# Patient Record
Sex: Female | Born: 1965 | Race: Black or African American | Hispanic: No | Smoking: Never smoker
Health system: Southern US, Community
[De-identification: ages and names within clinical notes are randomized; demographics above are authoritative.]

## PROBLEM LIST (undated history)

## (undated) DIAGNOSIS — I10 Essential (primary) hypertension: Secondary | ICD-10-CM

## (undated) DIAGNOSIS — K5900 Constipation, unspecified: Secondary | ICD-10-CM

## (undated) DIAGNOSIS — J189 Pneumonia, unspecified organism: Secondary | ICD-10-CM

## (undated) DIAGNOSIS — R112 Nausea with vomiting, unspecified: Secondary | ICD-10-CM

## (undated) DIAGNOSIS — K219 Gastro-esophageal reflux disease without esophagitis: Secondary | ICD-10-CM

## (undated) DIAGNOSIS — Z9889 Other specified postprocedural states: Secondary | ICD-10-CM

## (undated) DIAGNOSIS — M1990 Unspecified osteoarthritis, unspecified site: Secondary | ICD-10-CM

## (undated) DIAGNOSIS — Z8489 Family history of other specified conditions: Secondary | ICD-10-CM

## (undated) DIAGNOSIS — B009 Herpesviral infection, unspecified: Secondary | ICD-10-CM

## (undated) HISTORY — PX: OTHER SURGICAL HISTORY: SHX169

## (undated) HISTORY — PX: TUBAL LIGATION: SHX77

## (undated) HISTORY — PX: KNEE ARTHROSCOPY: SUR90

## (undated) HISTORY — DX: Essential (primary) hypertension: I10

---

## 1998-05-15 ENCOUNTER — Emergency Department (HOSPITAL_COMMUNITY): Admission: EM | Admit: 1998-05-15 | Discharge: 1998-05-15 | Payer: Self-pay | Admitting: Emergency Medicine

## 1998-11-06 ENCOUNTER — Other Ambulatory Visit: Admission: RE | Admit: 1998-11-06 | Discharge: 1998-11-06 | Payer: Self-pay | Admitting: Gynecology

## 1999-11-17 ENCOUNTER — Ambulatory Visit (HOSPITAL_BASED_OUTPATIENT_CLINIC_OR_DEPARTMENT_OTHER): Admission: RE | Admit: 1999-11-17 | Discharge: 1999-11-17 | Payer: Self-pay | Admitting: General Surgery

## 2000-05-31 ENCOUNTER — Other Ambulatory Visit: Admission: RE | Admit: 2000-05-31 | Discharge: 2000-05-31 | Payer: Self-pay | Admitting: *Deleted

## 2000-08-26 ENCOUNTER — Observation Stay (HOSPITAL_COMMUNITY): Admission: AD | Admit: 2000-08-26 | Discharge: 2000-08-27 | Payer: Self-pay | Admitting: Gynecology

## 2000-08-29 ENCOUNTER — Ambulatory Visit (HOSPITAL_COMMUNITY): Admission: RE | Admit: 2000-08-29 | Discharge: 2000-08-29 | Payer: Self-pay | Admitting: Gynecology

## 2000-08-29 ENCOUNTER — Encounter: Payer: Self-pay | Admitting: Gynecology

## 2000-11-26 ENCOUNTER — Inpatient Hospital Stay (HOSPITAL_COMMUNITY): Admission: AD | Admit: 2000-11-26 | Discharge: 2000-11-26 | Payer: Self-pay | Admitting: *Deleted

## 2000-11-29 ENCOUNTER — Encounter (INDEPENDENT_AMBULATORY_CARE_PROVIDER_SITE_OTHER): Payer: Self-pay

## 2000-11-29 ENCOUNTER — Inpatient Hospital Stay (HOSPITAL_COMMUNITY): Admission: AD | Admit: 2000-11-29 | Discharge: 2000-12-01 | Payer: Self-pay | Admitting: *Deleted

## 2001-01-11 ENCOUNTER — Other Ambulatory Visit: Admission: RE | Admit: 2001-01-11 | Discharge: 2001-01-11 | Payer: Self-pay | Admitting: *Deleted

## 2002-02-14 ENCOUNTER — Other Ambulatory Visit: Admission: RE | Admit: 2002-02-14 | Discharge: 2002-02-14 | Payer: Self-pay | Admitting: *Deleted

## 2003-02-18 ENCOUNTER — Other Ambulatory Visit: Admission: RE | Admit: 2003-02-18 | Discharge: 2003-02-18 | Payer: Self-pay | Admitting: Obstetrics and Gynecology

## 2004-03-21 ENCOUNTER — Emergency Department (HOSPITAL_COMMUNITY): Admission: EM | Admit: 2004-03-21 | Discharge: 2004-03-21 | Payer: Self-pay | Admitting: Family Medicine

## 2004-11-29 ENCOUNTER — Emergency Department (HOSPITAL_COMMUNITY): Admission: EM | Admit: 2004-11-29 | Discharge: 2004-11-29 | Payer: Self-pay | Admitting: Family Medicine

## 2006-02-05 ENCOUNTER — Emergency Department (HOSPITAL_COMMUNITY): Admission: EM | Admit: 2006-02-05 | Discharge: 2006-02-05 | Payer: Self-pay | Admitting: Emergency Medicine

## 2006-03-18 ENCOUNTER — Emergency Department (HOSPITAL_COMMUNITY): Admission: EM | Admit: 2006-03-18 | Discharge: 2006-03-18 | Payer: Self-pay | Admitting: Family Medicine

## 2006-07-08 ENCOUNTER — Other Ambulatory Visit: Admission: RE | Admit: 2006-07-08 | Discharge: 2006-07-08 | Payer: Self-pay | Admitting: *Deleted

## 2007-09-29 ENCOUNTER — Other Ambulatory Visit: Admission: RE | Admit: 2007-09-29 | Discharge: 2007-09-29 | Payer: Self-pay | Admitting: Family Medicine

## 2011-01-22 NOTE — Discharge Summary (Signed)
[facility]  Patient:    Cheyenne Graham, Cheyenne Graham                        MRN: [id number] Adm. Date:  [id number] Disc. Date: [id number] Attending:  Merrily Pew Dictator:   Antony Contras, [facility]                           Discharge Summary  DISCHARGE DIAGNOSES:          1. Intrauterine pregnancy at term.                               2. History of preterm labor.                               3. History of marginal placenta previa, which                                  resolved.                               4. Desires tubal sterilization.  PROCEDURES:                   Normal spontaneous vaginal delivery, viable ______ perineum.  Also, postpartum tubal ligation.  HISTORY OF PRESENT ILLNESS:   The patient is a 45 year old gravida 6, para 3, 0, 2, 3, with LMP February 28, 2000, [facility] December 04, 2000.  Prenatal risk factors include a history of ______ for which patient was treated with Procardia, which was discontinued at 36 weeks, also marginal placenta previa which resolved over sequential ultrasound measurements.  LABORATORY DATA:              Blood type B positive, antibody screen negative, sickle cell negative, RPR ______ rubella immune, MSAFP normal.  Amniocentesis is normal.  GBS negative.  HOSPITAL COURSE AND TREATMENT:    The patient was admitted on November 29, 2000, with regular uterine contractions.  Cervix was 4 cm dilated.  She did progress to complete dilatation, was delivered of an Apgar 9/9, female infant weighing 8 pounds 1 ounce over intact perineum.  She did wish tubal sterilization and this was performed on November 30, 2000.  Delivery and sterilization performed by Dr. ______ .  Findings involved postpartum uterus with normal tubes and ovaries.  POSTPARTUM COURSE:            The patient remained afebrile, had no difficulty voiding, was able to be discharged on her second postpartum day in satisfactory condition.  CBC, hematocrit 32.3, hemoglobin  11.6, WBCs 11.2, platelets 293.  DISCHARGE DISPOSITION:        Follow up in six weeks, continue prenatal vitamins and iron, Motrin, Tylox for pain. DD:  12/19/00 TD:  12/19/00 Job: [id number]

## 2011-01-22 NOTE — Op Note (Signed)
[facility]  Patient:    SKARLETT, SEDLACEK                        MRN: [id number] Proc. Date: 11/30/00 Adm. Date:  [id number] Attending:  Merrily Pew                           Operative Report  PREOPERATIVE DIAGNOSIS:       Multiparity, desires sterility.  POSTOPERATIVE DIAGNOSIS:      Multiparity, desires sterility.  PROCEDURE:                    Modified P[place]oy postpartum tubal ligation.  SURGEON:                      Katy Fitch, M.D.  ANESTHESIA:                   Epidural.  ESTIMATED BLOOD LOSS:         Less than 10 cc.  URINE OUTPUT:                 None.  FLUIDS:                       1 L.  COMPLICATIONS:                None.  SPECIMENS:                    Mid portions of right and left fallopian tubes.  FINDINGS:                     Postpartum sized uterus.  Normal appearing tubes and ovaries.  DESCRIPTION OF PROCEDURE:     The patient was taken to the operating room, where the epidural catheter was bolused and anesthesia was found to be adequate.  The patient was then prepped and draped in the normal sterile fashion.  An infraumbilical incision approximately 2 cm in length was made with a scalpel.  The underlying subcutaneous tissue was dissected down to the fascia with a hemostat.  The fascia was grasped with Kocher clamps and excised sharply with a scalpel.  The peritoneal cavity was then entered using hemostat.  Once into the abdominal cavity, S-retractors were placed to identify the right tube.  The omentum was noted to be over the uterine fundus, and a sponge with a hemostat tag was placed to retract the omentum out of the way.  Once this was accomplished, the right tube was identified, grasped with a Babcock and then carried to the fimbriated end.  It was regrasped in the mid portion and doubly ligated with 0 plain suture and then excised and handed off.  Hemostasis was noted and the tube was then returned to the  abdominal cavity.  In a similar fashion, the left tube was identified, carried to the fimbriated end and doubly ligated with 0 plain, excised and placed in the pathology specimen container.  Hemostasis was also noted on this side.  The sponge was removed.  The fascia was grasped and a figure-of-eight of 0 Vicryl was placed to close the fascial incision.  The skin was closed using a 4-0 Vicryl.  The patient was then taken to the recovery room in stable condition. DD:  11/30/00 TD:  11/30/00 Job: [id number]

## 2011-01-22 NOTE — Op Note (Signed)
[facility]  Patient:    Cheyenne Graham, Cheyenne Graham                        MRN: [id number] Proc. Date: 11/17/99 Adm. Date:  [id number] Attending:  Carson Myrtle                           Operative Report  PREOPERATIVE DIAGNOSIS:  Lipoma, left flank.  POSTOPERATIVE DIAGNOSIS:  Lipoma, left flank.  OPERATION:  Excision lipoma of left flank.  SURGEON:  Timothy E. Earlene Plater, M.D.  ANESTHESIA:  [place]  HISTORY:  [name] is an otherwise healthy 45 year old African-American female who presented in my office on referral for an enlarging mass in the left flank along the posterior axillary line.  It is easily 10 cm.  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 She wants to have it removed and we have scheduled it as such.  DESCRIPTION OF PROCEDURE:  The patient was brought to the operating room,  IV started sedation given. She was placed right side down, left side up and carefully padded and secured on the operating table.  IV sedation given.  The mass was obvious and easily palpable.  It easily measured approximately 12 x 8 cm and approximately 3 cm in depth.  Area was prepped and draped in the usual fashion,  1/4% Marcaine and 1% plain xylocaine were used throughout for local anesthesia. A generous incision was made.  The superficial subcutaneous fat to Scarpas fascia  was incised.  Bleeding points were cauterized. Beneath Scarpas fascia was this large irregular mass intimately adherent to the fascia of the musculature.  I approached this from the posterior inferior and then posterior superior, was finally able to dissect up the edge of the lipoma and then began dissecting it anteriorly as much as possible off the fascia of the muscle but did include some of the fascia of the muscle as well.  Bleeding was carefully cauterized as I proceeded. The wound was dry.  The mass was easily 8 x 12 x  3 cm and left quite a defect obviously.  Though the wound was dry, I elected to place a 7 mm drain, brought it out inferiorly and tied to the skin.  After the wound was dry and the counts were correct, the wound was closed with 2-0 Vicryl in the Scarpas fascia  and 3-0 Monocryl in the skin.  Steri-Strips were applied.  Second count was correct.  Dry sterile dressing was applied.  Drain was applied to suction.  She had tolerated it well and was removed to the recovery room. Written and verbal instructions were given to her along with Percocet and she will be seen and followed in the office. DD:  11/17/99 TD:  11/17/99 Job: [id number]

## 2011-01-22 NOTE — H&P (Signed)
[facility]  Patient:    Cheyenne Graham, Cheyenne Graham                          MRN: [id number] Adm. Date:  08/26/00 Attending:  Marcial Pacas P. Fontaine, M.D.                         History and Physical  CHIEF COMPLAINT:              Cervical shortening.  HISTORY OF PRESENT ILLNESS:   A 45 year old G6, P3, AB2 female at 50-[redacted] weeks gestation who presents complaining of some pelvic pressure and intermittent contractions.  The patient has been followed over the last several months noting initial cervical length of 6.4 and subsequently had a follow-up ultrasound due to a marginal placenta previa which the placenta had moved but her cervix was noted to be 3.3 cm.  She had a follow-up ultrasound two to three weeks later which showed a cervical length of 2.2 with funneling a length of 3.2 cm.  The patient subsequently had an ultrasound December 13 with 19 mm of cervical length and today has an ultrasound showing 10 mm of cervical length.  Patient denies leakage of fluid, discharge changes, regular contractions, bleeding, or other symptomatology.  She does note more pelvic pressure when she is on her feet.  The patient was advised as far as bed rest in the past and apparently has been more active up on her feet.  PAST MEDICAL HISTORY:         Uncomplicated.  PAST SURGICAL HISTORY:        D&C x 2, back surgery.  ALLERGIES:                    None.  CURRENT MEDICATIONS:          None.  PAST OBSTETRICAL HISTORY:     Preterm labor requiring multiple hospital visits with tocolysis with her last pregnancy in 1992, although she did go to 38 weeks and two prior full-term pregnancies.  She did have a TAB and an SAB both with D&Cs in 1998 and 2000.  Patient also is noted to have laser of her cervix for dysplasia in 1990.  PHYSICAL EXAMINATION:  HEENT:                        Normal.  LUNGS:                        Clear.  CARDIAC:                      Regular rate without rubs,  murmurs, or gallops.  ABDOMEN:                      Benign, gravid, appropriate for dates with positive fetal heart tones.  PELVIC:                       External, BUS, vagina:  Grossly normal.  Cervix visually normal.  Bimanual shows good cervical length on vaginal examination.  LABORATORIES:                 Wet prep negative.  ASSESSMENT:                   A 45 year old G6, P3,  AB2 female at 25-[redacted] weeks gestation with progressively shortening cervical length on serial ultrasounds. Risk factors include her multiple gestation and her two dilatation and curettage.  Also, she had laser of the cervix.  The patient relates fairly painful uterine contractions, particularly later in the day, although all are reported at irregular intervals with no increased frequency or regularity to them.  Certainly, with progressive dilatation now at 1 cm long, the fear for preterm delivery was discussed with the patient and at this critical time of 25-26 weeks the potential for devastating sequelae as far as the fetus is concerned if delivery would occur now was reviewed with her.  I feel the most prudent action would be to admit her to the hospital for observation at present with external monitoring and the consideration for prophylactic oral tocolysis was reviewed with her.  The issues of prophylactic tocolysis if she is not showing significant contractions on the external monitor was discussed with her as to whether this truly is efficacious in preventing preterm delivery or not was discussed and she understands and accepts these issues.  A strep culture as well as GC/chlamydia screen of the cervix was done.  There is no evidence of rupture of membranes.  The wet prep was negative.  The options for cervical cerclage were also discussed with her and the issues to prophylactically cerclage now before frank dilatation of the cervix occurs versus placing a cerclage now that in and of itself could lead  to complications such as infection, precipitating preterm overt labor which can not be stopped, or rupture of membranes was all discussed with her.  The patient is comfortable at this time not to proceed with cerclage, but to proceed with expectant management.  I also discussed with her that the absolute need for bed rest is essential and she admits that she was not doing this and was up on her feet fairly frequently throughout the day and I stressed the importance of bed rest to her.  We will tentatively plan on admitting her and monitoring her.  Will proceed with overt tocolysis if indeed contractions are found.  If irregular, then will proceed with oral prophylactic tocolysis and will monitor throughout this evening.  If remains stable, then will discharge in the morning to at home bed rest and then follow up in several days for reultrasound evaluation.  The patient agrees with the plan. DD:  08/26/00 TD:  08/26/00 Job: [id number]

## 2011-01-22 NOTE — H&P (Signed)
[facility]  Patient:    Cheyenne Graham, Cheyenne Graham                        MRN: [id number] Adm. Date:  [id number] Attending:  Merrily Pew                         History and Physical  CHIEF COMPLAINT:              Labor.  HISTORY OF PRESENT ILLNESS:   This 45 year old, G6, P3, female at [redacted] weeks gestation, admitted at 4 cm dilatation with contractions every 5 minutes, intact membranes.  Prenatal course was complicated by history of preterm labor for which she was on Procardia, which has now been discontinued, and a marginal previa which has resolved over sequential ultrasound measurements.  PAST MEDICAL HISTORY:         Uncomplicated.  PAST SURGICAL HISTORY:        D&C x 2 and laser for cervical intraepithelial neoplasia.  ALLERGIES:                    None.  MEDICATIONS:                  Vitamins.  REVIEW OF SYSTEMS:            Noncontributory.  FAMILY HISTORY:               Noncontributory.  PHYSICAL EXAMINATION:  VITAL SIGNS:                  Afebrile.  Vital signs are stable.  HEENT:                        Normal.  LUNGS:                        Clear.  CARDIAC:                      Regular rate without rubs, murmurs, or gallops.  ABDOMEN:                      Gravida, vertex fetus.  External monitor show reactive fetal tracing with contractions approximately every 5 minutes.  PELVIC:                       Completely effaced, 4 cm, -2 to -3 station, bulging membranes.  Artificial rupture of membranes, fluid is clear.  ASSESSMENT:                   This 45 year old, G6, P3, female, early labor, term pregnancy, for anticipated vaginal delivery.  The patients beta streptococcus status is not in her chart and this will be determined before delivery to assess whether antibiotic prophylaxis is needed. DD:  11/29/00 TD:  11/29/00 Job: [id number]

## 2011-02-16 ENCOUNTER — Inpatient Hospital Stay (INDEPENDENT_AMBULATORY_CARE_PROVIDER_SITE_OTHER)
Admission: RE | Admit: 2011-02-16 | Discharge: 2011-02-16 | Disposition: A | Discharge status: Home / Self Care | Payer: Federal, State, Local not specified - PPO | Source: Ambulatory Visit | Attending: Emergency Medicine | Admitting: Emergency Medicine

## 2011-02-16 DIAGNOSIS — L42 Pityriasis rosea: Secondary | ICD-10-CM

## 2011-02-16 DIAGNOSIS — J069 Acute upper respiratory infection, unspecified: Secondary | ICD-10-CM

## 2011-08-17 ENCOUNTER — Encounter: Payer: Self-pay | Admitting: *Deleted

## 2011-08-17 ENCOUNTER — Emergency Department (INDEPENDENT_AMBULATORY_CARE_PROVIDER_SITE_OTHER)
Admission: EM | Admit: 2011-08-17 | Discharge: 2011-08-17 | Disposition: A | Discharge status: Home / Self Care | Payer: Federal, State, Local not specified - PPO | Source: Home / Self Care | Attending: Emergency Medicine | Admitting: Emergency Medicine

## 2011-08-17 DIAGNOSIS — K2990 Gastroduodenitis, unspecified, without bleeding: Secondary | ICD-10-CM

## 2011-08-17 DIAGNOSIS — K2970 Gastritis, unspecified, without bleeding: Secondary | ICD-10-CM

## 2011-08-17 DIAGNOSIS — M170 Bilateral primary osteoarthritis of knee: Secondary | ICD-10-CM

## 2011-08-17 DIAGNOSIS — M1710 Unilateral primary osteoarthritis, unspecified knee: Secondary | ICD-10-CM

## 2011-08-17 HISTORY — DX: Unspecified osteoarthritis, unspecified site: M19.90

## 2011-08-17 MED ORDER — TRAMADOL HCL 50 MG PO TABS: 100.0000 mg | ORAL_TABLET | Freq: Three times a day (TID) | ORAL | Status: AC | PRN

## 2011-08-17 MED ORDER — SUCRALFATE 1 GM/10ML PO SUSP: 1.0000 g | Freq: Four times a day (QID) | ORAL | Status: AC

## 2011-08-17 MED ORDER — OMEPRAZOLE 20 MG PO CPDR: 20.0000 mg | DELAYED_RELEASE_CAPSULE | Freq: Every day | ORAL | Status: DC

## 2011-08-17 NOTE — ED Notes (Signed)
Pt reports she has been taking ibuprofen regularly the last 2 weeks for pain in both knees.  She thinks it has caused stomach pain.  She has epigastric pain especially right after eating.  She also thinks she may be coming down with an URI.

## 2011-08-17 NOTE — ED Provider Notes (Signed)
History     CSN: [id number] Arrival date & time: 08/17/2011  3:28 PM   First MD Initiated Contact with Patient 08/17/11 1558      Chief Complaint  Patient presents with  . Headache    (Consider location/radiation/quality/duration/timing/severity/associated sxs/prior treatment) HPI Comments: Cheyenne Graham has had a 2 to three-week history of a constant periumbilical abdominal pain. It's worse if she takes ibuprofen, Tylenol, eats food quickly spicy, greasy, or acid foods. It's described as a sharp pain at times and sometimes a dull ache. His better in the morning or with TUMS. She feels nauseated but has not vomited. Her weight has been stable although her appetite has been diminished. She has lots of indigestion, heartburn, and water brash. She's taking large doses of ibuprofen for bilateral knee pain. She needs bilateral knee replacements. She doesn't have any history of ulcers, gastritis, or esophagitis, although she has had similar symptoms in the past. Her only other problem has been migraine headaches.  Patient is a 45 y.o. female presenting with headaches.  Headache The primary symptoms include headaches and nausea. Primary symptoms do not include fever or vomiting.    Past Medical History  Diagnosis Date  . Arthritis   . Migraine     Past Surgical History  Procedure Date  . Knee arthroscopy     Family History  Problem Relation Age of Onset  . Hypertension Mother   . Cancer Mother   . Hypertension Father   . Asthma Sister   . Migraines Sister     History  Substance Use Topics  . Smoking status: Never Smoker   . Smokeless tobacco: Not on file  . Alcohol Use: Yes     occasional    OB History    Grav Para Term Preterm Abortions TAB SAB Ect Mult Living                  Review of Systems  Constitutional: Negative for fever, chills, appetite change and unexpected weight change.  Respiratory: Negative for cough, shortness of breath and wheezing.   Cardiovascular:  Negative for chest pain.  Gastrointestinal: Positive for nausea and abdominal pain. Negative for vomiting, diarrhea, constipation, blood in stool, abdominal distention, anal bleeding and rectal pain.  Genitourinary: Negative for dysuria, urgency and frequency.  Skin: Negative for rash.  Neurological: Positive for headaches.    Allergies  Review of patient's allergies indicates no known allergies.  Home Medications   Current Outpatient Rx  Name Route Sig Dispense Refill  . OMEPRAZOLE 20 MG PO CPDR Oral Take 1 capsule (20 mg total) by mouth daily. 30 capsule 0  . SUCRALFATE 1 GM/10ML PO SUSP Oral Take 10 mLs (1 g total) by mouth 4 (four) times daily. 420 mL 0  . TRAMADOL HCL 50 MG PO TABS Oral Take 2 tablets (100 mg total) by mouth every 8 (eight) hours as needed for pain. Maximum dose= 8 tablets per day 30 tablet 0    BP 131/82  Pulse 80  Temp(Src) 97.8 F (36.6 C) (Oral)  Resp 18  SpO2 100%  LMP 08/05/2011  Physical Exam  Nursing note and vitals reviewed. Constitutional: She appears well-developed and well-nourished. No distress.  Eyes: No scleral icterus.  Cardiovascular: Normal rate, regular rhythm and normal heart sounds.  Exam reveals no gallop and no friction rub.   No murmur heard. Pulmonary/Chest: Effort normal and breath sounds normal. No respiratory distress. She has no wheezes. She has no rales.  Abdominal: Soft. Bowel sounds are  normal. She exhibits no distension and no mass. There is no hepatosplenomegaly. There is no tenderness. There is no rebound, no guarding and no CVA tenderness.  Skin: Skin is warm and dry. No rash noted. She is not diaphoretic.    ED Course  Procedures (including critical care time)  Labs Reviewed - No data to display No results found.   1. Gastritis   2. Primary osteoarthritis of both knees       MDM  I think she has either an ulcer, gastritis, or esophagitis, secondary to heavy doses of ibuprofen which she takes for knee pain.  She was told to stop the ibuprofen and given tramadol for the pain, she was also given omeprazole and Carafate.        Roque Lias, MD 08/17/11 2228

## 2011-12-17 ENCOUNTER — Encounter (HOSPITAL_COMMUNITY): Payer: Self-pay

## 2011-12-17 ENCOUNTER — Emergency Department (INDEPENDENT_AMBULATORY_CARE_PROVIDER_SITE_OTHER)
Admission: EM | Admit: 2011-12-17 | Discharge: 2011-12-17 | Disposition: A | Discharge status: Home / Self Care | Payer: Federal, State, Local not specified - PPO | Source: Home / Self Care | Attending: Emergency Medicine | Admitting: Emergency Medicine

## 2011-12-17 DIAGNOSIS — R51 Headache: Secondary | ICD-10-CM

## 2011-12-17 DIAGNOSIS — M25569 Pain in unspecified knee: Secondary | ICD-10-CM

## 2011-12-17 MED ORDER — TRAMADOL HCL 50 MG PO TABS: 50.0000 mg | ORAL_TABLET | Freq: Four times a day (QID) | ORAL | Status: AC | PRN

## 2011-12-17 MED ORDER — MELOXICAM 7.5 MG PO TABS: 7.5000 mg | ORAL_TABLET | Freq: Every day | ORAL | Status: DC

## 2011-12-17 NOTE — ED Provider Notes (Signed)
History     CSN: [id number]  Arrival date & time 12/17/11  1232   First MD Initiated Contact with Patient 12/17/11 1254      Chief Complaint  Patient presents with  . Muscle Pain    (Consider location/radiation/quality/duration/timing/severity/associated sxs/prior treatment) HPI Comments: Patient presents urgent care she continues to have knee pains that came in as her left knee has been giving her more discomfort. She is aware that she needs a right knee replacement but has been waiting to pursue this recommended intervention by her orthopedic provider. Patient also describes she's been taking Motrin frequently as she also been having a migraine headache she describes that potentially her knee problems are given her a headache. It is associated with mild nausea and photophobia but no vomiting.  She describes how works in [employer] office as an ongoing challenge if she has to stand for long periods of time and her knees at the end of the days he can barely walk on them as the pain is intolerable  Patient is a 46 y.o. female presenting with musculoskeletal pain. The history is provided by the patient.  Muscle Pain This is a recurrent problem. The current episode started yesterday. The problem occurs constantly. The problem has been gradually worsening. Associated symptoms include headaches. Pertinent negatives include no abdominal pain and no shortness of breath. The symptoms are aggravated by bending and twisting. The symptoms are relieved by NSAIDs. Treatments tried: morin OTC.    Past Medical History  Diagnosis Date  . Arthritis   . Migraine   . Migraine     Past Surgical History  Procedure Date  . Knee arthroscopy     Family History  Problem Relation Age of Onset  . Hypertension Mother   . Cancer Mother   . Hypertension Father   . Asthma Sister   . Migraines Sister     History  Substance Use Topics  . Smoking status: Never Smoker   . Smokeless tobacco: Not on file    . Alcohol Use: Yes     occasional    OB History    Grav Para Term Preterm Abortions TAB SAB Ect Mult Living                  Review of Systems  Constitutional: Negative for fever, chills and fatigue.  Respiratory: Negative for shortness of breath.   Gastrointestinal: Negative for abdominal pain.  Musculoskeletal: Positive for gait problem.  Neurological: Positive for headaches. Negative for dizziness, tremors, seizures, syncope, weakness, light-headedness and numbness.    Allergies  Review of patient's allergies indicates no known allergies.  Home Medications   Current Outpatient Rx  Name Route Sig Dispense Refill  . MELOXICAM 7.5 MG PO TABS Oral Take 1 tablet (7.5 mg total) by mouth daily. 14 tablet 0  . OMEPRAZOLE 20 MG PO CPDR Oral Take 1 capsule (20 mg total) by mouth daily. 30 capsule 0  . TRAMADOL HCL 50 MG PO TABS Oral Take 1 tablet (50 mg total) by mouth every 6 (six) hours as needed for pain. 20 tablet 0    BP 140/83  Pulse 84  Temp(Src) 98.3 F (36.8 C) (Oral)  Resp 12  SpO2 100%  LMP 12/14/2011  Physical Exam  Nursing note and vitals reviewed. Constitutional: She appears well-developed and well-nourished.  HENT:  Head: Normocephalic.  Mouth/Throat: Uvula is midline. No oropharyngeal exudate.  Eyes: Conjunctivae and EOM are normal. Pupils are equal, round, and reactive to light. No  scleral icterus.  Neck: Neck supple. No JVD present.  Pulmonary/Chest: Effort normal.  Musculoskeletal:       Left knee: She exhibits decreased range of motion. She exhibits no swelling, no effusion, no deformity, no erythema, normal alignment and normal patellar mobility. tenderness found. Medial joint line tenderness noted.       Legs: Neurological: She is alert. No cranial nerve deficit or sensory deficit. She exhibits normal muscle tone. Coordination normal.  Skin: Skin is warm.    ED Course  Procedures (including critical care time)  Labs Reviewed - No data to  display No results found.   1. Knee joint pain   2. Headache       MDM  Recurrent bilateral knee pain. With acute exacerbation of left knee pain, also with recurrent headache. Patient seems to have a medical home with bleach and at urgent care have encouraged her to followup with them in regards to her headaches and also to see her orthopedic Dr. or her primary care Dr. to consider physical therapy of her left knee as she prepares to pursue right knee replacement in the near future. Heparin prescribe both and then saved for 2 weeks and Ultram for pain management. Patient agree with plan of care and followup as instructed        Jimmie Molly, MD 12/17/11 1442

## 2011-12-17 NOTE — Discharge Instructions (Signed)
   With your primary care Dr. as discussed as they can refer you for some physical therapy or if you prefer can talk this further with your orthopedic provider.  Headache Headaches are caused by many different problems. Most commonly, headache is caused by muscle tension from an injury, fatigue, or emotional upset. Excessive muscle contractions in the scalp and neck result in a headache that often feels like a tight band around the head. Tension headaches often have areas of tenderness over the scalp and the back of the neck. These headaches may last for hours, days, or longer, and some may contribute to migraines in those who have migraine problems. Migraines usually cause a throbbing headache, which is made worse by activity. Sometimes only one side of the head hurts. Nausea, vomiting, eye pain, and avoidance of food are common with migraines. Visual symptoms such as light sensitivity, blind spots, or flashing lights may also occur. Loud noises may worsen migraine headaches. Many factors may cause migraine headaches:  Emotional stress, lack of sleep, and menstrual periods.   Alcohol and some drugs (such as birth control pills).   Diet factors (fasting, caffeine, food preservatives, chocolate).   Environmental factors (weather changes, bright lights, odors, smoke).  Other causes of headaches include minor injuries to the head. Arthritis in the neck; problems with the jaw, eyes, ears, or nose are also causes of headaches. Allergies, drugs, alcohol, and exposure to smoke can also cause moderate headaches. Rebound headaches can occur if someone uses pain medications for a long period of time and then stops. Less commonly, blood vessel problems in the neck and brain (including stroke) can cause various types of headache. Treatment of headaches includes medicines for pain and relaxation. Ice packs or heat applied to the back of the head and neck help some people. Massaging the shoulders, neck and scalp  are often very useful. Relaxation techniques and stretching can help prevent these headaches. Avoid alcohol and cigarette smoking as these tend to make headaches worse. Please see your caregiver if your headache is not better in 2 days.  SEEK IMMEDIATE MEDICAL CARE IF:   You develop a high fever, chills, or repeated vomiting.   You faint or have difficulty with vision.   You develop unusual numbness or weakness of your arms or legs.   Relief of pain is inadequate with medication, or you develop severe pain.   You develop confusion, or neck stiffness.   You have a worsening of a headache or do not obtain relief.  Document Released: 08/23/2005 Document Revised: 08/12/2011 Document Reviewed: 02/16/2007 [facility] Patient Information 2012 [place], [place].

## 2011-12-17 NOTE — ED Notes (Signed)
Pt with history of knee scoping right knee 6 yrs ago and was reportedly told by Dr Cleophas Dunker (ortho) that there was nothing else he could do to help that knee and she needed a joint replacement; now has pain in her GOOD knee left , no known injury, taking 800mg  motrin every 4 hours w/o relief. Also c/o has migraine HA every day , and has not has a pain free day in some time , today HA is right frontal /top of head and /and left post head, HA w nausea. HAS never had an evaluation of her HA by a MD, and her HA reportedly went to  remission during pregnancy

## 2011-12-17 NOTE — ED Notes (Signed)
Unable to get pt to sign in black box due  To not being able to pull box up

## 2012-02-09 ENCOUNTER — Inpatient Hospital Stay (HOSPITAL_COMMUNITY): Admission: RE | Admit: 2012-02-09 | Payer: Federal, State, Local not specified - PPO | Source: Ambulatory Visit

## 2012-02-09 ENCOUNTER — Encounter (HOSPITAL_COMMUNITY): Payer: Self-pay | Admitting: *Deleted

## 2012-02-14 ENCOUNTER — Encounter (HOSPITAL_COMMUNITY): Payer: Self-pay | Admitting: Pharmacy Technician

## 2012-02-15 ENCOUNTER — Encounter (HOSPITAL_COMMUNITY)
Admission: RE | Admit: 2012-02-15 | Discharge: 2012-02-15 | Disposition: A | Discharge status: Home / Self Care | Payer: Federal, State, Local not specified - PPO | Source: Ambulatory Visit | Attending: Orthopedic Surgery | Admitting: Orthopedic Surgery

## 2012-02-15 ENCOUNTER — Encounter (HOSPITAL_COMMUNITY)
Admission: RE | Admit: 2012-02-15 | Discharge: 2012-02-15 | Disposition: A | Discharge status: Home / Self Care | Payer: Federal, State, Local not specified - PPO | Source: Ambulatory Visit | Attending: Orthopaedic Surgery | Admitting: Orthopaedic Surgery

## 2012-02-15 LAB — DIFFERENTIAL
Basophils Absolute: 0 10*3/uL (ref 0.0–0.1)
Basophils Relative: 0 % (ref 0–1)
Eosinophils Absolute: 0.1 10*3/uL (ref 0.0–0.7)
Eosinophils Relative: 1 % (ref 0–5)
Monocytes Absolute: 0.3 10*3/uL (ref 0.1–1.0)
Monocytes Relative: 7 % (ref 3–12)
Neutro Abs: 2.3 10*3/uL (ref 1.7–7.7)

## 2012-02-15 LAB — CBC
HCT: 37 % (ref 36.0–46.0)
MCV: 85.5 fL (ref 78.0–100.0)
Platelets: 311 10*3/uL (ref 150–400)
RBC: 4.33 MIL/uL (ref 3.87–5.11)
WBC: 4.6 10*3/uL (ref 4.0–10.5)

## 2012-02-15 LAB — URINALYSIS, ROUTINE W REFLEX MICROSCOPIC
Glucose, UA: NEGATIVE mg/dL
Specific Gravity, Urine: 1.029 (ref 1.005–1.030)
pH: 5.5 (ref 5.0–8.0)

## 2012-02-15 LAB — COMPREHENSIVE METABOLIC PANEL
AST: 20 U/L (ref 0–37)
Alkaline Phosphatase: 57 U/L (ref 39–117)
CO2: 25 mEq/L (ref 19–32)
Chloride: 105 mEq/L (ref 96–112)
Creatinine, Ser: 0.74 mg/dL (ref 0.50–1.10)
GFR calc non Af Amer: >90 mL/min (ref >90)
Potassium: 3.8 mEq/L (ref 3.5–5.1)
Total Bilirubin: 0.2 mg/dL — ABNORMAL LOW (ref 0.3–1.2)

## 2012-02-15 LAB — SURGICAL PCR SCREEN: Staphylococcus aureus: NEGATIVE

## 2012-02-15 LAB — APTT: aPTT: 25 seconds (ref 24–37)

## 2012-02-15 LAB — ABO/RH: ABO/RH(D): B POS

## 2012-02-15 LAB — URINE MICROSCOPIC-ADD ON

## 2012-02-15 NOTE — Pre-Procedure Instructions (Deleted)
20 MAKALYNN BERWANGER  02/15/2012   Your procedure is scheduled on:  June 18  Report to [facility] Stay [facility] at 05:30 AM.  Call this number if you have problems the morning of surgery: [phone]   Remember:   Do not eat food:After Midnight.  May have clear liquids:until Midnight .  Clear liquids include soda, tea, black coffee, apple or grape juice, broth.  Take these medicines the morning of surgery with A SIP OF WATER: Hydrocodone, Penicillin, Valtrex, Diflucan   Do not wear jewelry, make-up or nail polish.  Do not wear lotions, powders, or perfumes. You may wear deodorant.  Do not shave 48 hours prior to surgery. Men may shave face and neck.  Do not bring valuables to the hospital.  Contacts, dentures or bridgework may not be worn into surgery.  Leave suitcase in the car. After surgery it may be brought to your room.  For patients admitted to the hospital, checkout time is 11:00 AM the day of discharge.   Special Instructions: Incentive Spirometry - Practice and bring it with you on the day of surgery. and CHG Shower Use Special Wash: 1/2 bottle night before surgery and 1/2 bottle morning of surgery.   Please read over the following fact sheets that you were given: Pain Booklet, Coughing and Deep Breathing, Blood Transfusion Information and Surgical Site Infection Prevention

## 2012-02-15 NOTE — Pre-Procedure Instructions (Deleted)
20 Cheyenne Graham  02/15/2012   Your procedure is scheduled on:  June 18  Report to [facility] Stay Center at 5:30AM.  Call this number if you have problems the morning of surgery: [phone]   Remember:   Do not eat food:After Midnight.  May have clear liquids:until Midnight .  Clear liquids include soda, tea, black coffee, apple or grape juice, broth.   Take these medicines the morning of surgery with A SIP OF WATER: Omeprazole (Prilosec)  Stop taking Mobic and any Aspirin products, NSAIDS and Herbal medications 7 days prior to surgery.   Do not wear jewelry, make-up or nail polish.  Do not wear lotions, powders, or perfumes. You may wear deodorant.  Do not shave 48 hours prior to surgery. Men may shave face and neck.  Do not bring valuables to the hospital.  Contacts, dentures or bridgework may not be worn into surgery.  Leave suitcase in the car. After surgery it may be brought to your room.  For patients admitted to the hospital, checkout time is 11:00 AM the day of discharge.   Patients discharged the day of surgery will not be allowed to drive home.  Name and phone number of your driver: NA  Special Instructions: CHG Shower Use Special Wash: 1/2 bottle night before surgery and 1/2 bottle morning of surgery.   Please read over the following fact sheets that you were given: Pain Booklet, Coughing and Deep Breathing, Blood Transfusion Information, MRSA Information and Surgical Site Infection Prevention

## 2012-02-15 NOTE — Pre-Procedure Instructions (Signed)
[address] Cheyenne Graham  02/15/2012   Your procedure is scheduled on:  Tuesday, June 18th.  Report to [facility] at 5:30AM.  Call this number if you have problems the morning of surgery: [phone]   Remember:   Do not eat food:After Midnight.  May have clear liquids:until Midnight .   Clear liquids include soda, tea, black coffee, apple or grape juice, broth.   Take these medicines the morning of surgery with A SIP OF WATER:  Diflucan, Norco, Penicillin, Valtrex   Do not wear jewelry, make-up or nail polish.  Do not wear lotions, powders, or perfumes. You may wear deodorant.  Do not shave 48 hours prior to surgery. Men may shave face and neck.  Do not bring valuables to the hospital.  Contacts, dentures or bridgework may not be worn into surgery.  Leave suitcase in the car. After surgery it may be brought to your room.  For patients admitted to the hospital, checkout time is 11:00 AM the day of discharge.   Special Instructions: CHG Shower Use Special Wash: 1/2 bottle night before surgery and 1/2 bottle morning of surgery.   Please read over the following fact sheets that you were given: Pain Booklet, Coughing and Deep Breathing, Blood Transfusion Information, MRSA Information and Surgical Site Infection Prevention

## 2012-02-16 LAB — URINE CULTURE
Colony Count: NO GROWTH
Culture: NO GROWTH

## 2012-02-17 NOTE — H&P (Signed)
CHIEF COMPLAINT:  Painful right knee.   HISTORY:   Cheyenne Graham is a very pleasant 46 year old white female who is seen today for evaluation of her right knee. She has had a history of right knee arthritis dating back to at least 2007. She has had corticosteroid injections as well as Hyalgan injections. She has actually had both knees that have been quite symptomatic. However more recently though the right knee has worsened. She did have a hiatus where she was treated conservatively and had been using Mobic and ibuprofen. She has more recently been noted to have worsening pain now in her right knee. She did have an arthroscopy back in 2006 on the right knee. She is now having this constant extremely severe sharp, stabbing pain at times but more of a throbbing, aching and burning pain. She has had effusions especially with work. She works at the [employer] and tends to be on her feet a lot. She noticed some weakness in the right knee. Both her knees wake her at nighttime. Activity and exercise and especially work worsens her symptoms. Rest has been helpful. She has been tried with corticosteroid as well as Hyalgan injections and really has gotten to the point now where she states these are not of benefit to her. Again, she is having difficulty at nighttime. She is seen today for reevaluation of her right knee.  PAST MEDICAL HISTORY:   Her past medical history reveals childbirth in 1986, 1988, 72 and 2002.   PAST SURGICAL HISTORY:   Right arthroscopic knee surgery in 2006.  CURRENT MEDICATION:   Advil 800 mg daily Valtrex 100 mg daily  Allergies: None known.  REVIEW OF SYSTEMS:   A 14 point review of systems is totally unremarkable and negative except for headaches, that of migraines. She has had weight gain secondary to inability to exercise. She has had some vaginal discharge in the past but nothing recently.  FAMILY HISTORY:  Family history reveals a mother who is 47 years of age who has a history  of hypertension and ovarian cancer but is still alive. Father is alive at age 47 and has had myocardial infarction in the past and a history of hypertension. She has no brothers. She has 3 sisters ages 47, 55 and 59, and they are in fairly good health.  SOCIAL HISTORY:  She is a 46 year old white divorced female who works for the [employer]. She does not smoke. She drinks once every week or two, 1-2 glasses of wine.  PHYSICAL EXAM:  Examination today reveals a 46 year old white female. Well-developed, well-nourished, alert, pleasant and cooperative in moderate distress secondary to right knee pain.  She is 5 foot 5 inches and weighs 192 pounds with a BMI of 32.0.  Vital signs reveal temperature 97.6, pulse 75, respirations 18, blood pressure 132/85.  Head is normocephalic. Ears, nose and throat were benign. Neck was supple; no bruits were noted. Chest had good expansion. Lungs were essentially clear to auscultation. Cardiac had a regular rhythm and rate; normal S1-S2; no murmurs were noted. Pulses 2+ dorsalis pedis bilaterally. Abdomen is obese, soft, nontender; no masses palpable; normal bowel sounds present. CNS: she is oriented x3 and cranial nerves II through XII grossly intact. Skin is intact over the knee on the right and left. Genital, rectal and breast exam not indicated for orthopedic evaluation. Musculoskeletal: she has range of motion from about 2-3 degrees to that of 110 degrees. She has varus positioning with weightbearing. Pain more medially  than laterally. There is some patellofemoral crepitus with range of motion. Difficult to tell effusion-wise because of the size of her knee. She may have a trace to 1+. A little bit of pseudo-laxity with varus and valgus stressing. Calf is supple and nontender. Smooth motion of the hip.  X-RAYS: 3-view x-rays of the right knee reveals bone-on-bone medial joint line osteoarthritis. 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  I  reviewed a form from [facility]. They feel that she is cleared from a medical and cardiac standpoint. They do not do any inpatient consultations and the hospitalist group should be obtained if she has any problems in the hospital if we proceed with surgery.  CLINICAL IMPRESSION:   1.  End-stage OA of the right knee. 2.  Exogenous obesity. 3.  History of migraines.  Recommendations: At this time we feel that she would be a candidate for a right total knee arthroplasty. Procedure risks and benefits were explained to her in detail and appropriate models were used to show her the procedure and what is going to occur. I have gone over the hospital course postoperatively and what plans would be for the future after total knee arthroplasty. All questions were answered.   Cheyenne Graham [facility] [phone]  02/17/2012 8:14 PM

## 2012-02-21 MED ORDER — CEFAZOLIN SODIUM-DEXTROSE 2-3 GM-% IV SOLR
2.0000 g | INTRAVENOUS | Status: DC
  Filled 2012-02-21: qty 50

## 2012-02-22 ENCOUNTER — Ambulatory Visit (HOSPITAL_COMMUNITY): Payer: Federal, State, Local not specified - PPO | Admitting: Anesthesiology

## 2012-02-22 ENCOUNTER — Encounter (HOSPITAL_COMMUNITY): Payer: Self-pay | Admitting: Anesthesiology

## 2012-02-22 ENCOUNTER — Encounter (HOSPITAL_COMMUNITY): Payer: Self-pay | Admitting: General Practice

## 2012-02-22 ENCOUNTER — Inpatient Hospital Stay (HOSPITAL_COMMUNITY)
Admission: RE | Admit: 2012-02-22 | Discharge: 2012-02-24 | DRG: 209 | Disposition: A | Discharge status: Home Health | Payer: Federal, State, Local not specified - PPO | Source: Ambulatory Visit | Attending: Orthopaedic Surgery | Admitting: Orthopaedic Surgery

## 2012-02-22 ENCOUNTER — Encounter (HOSPITAL_COMMUNITY)
Admission: RE | Disposition: A | Discharge status: Home Health | Payer: Self-pay | Source: Ambulatory Visit | Attending: Orthopaedic Surgery

## 2012-02-22 DIAGNOSIS — M179 Osteoarthritis of knee, unspecified: Secondary | ICD-10-CM

## 2012-02-22 DIAGNOSIS — E669 Obesity, unspecified: Secondary | ICD-10-CM | POA: Diagnosis present

## 2012-02-22 DIAGNOSIS — M1710 Unilateral primary osteoarthritis, unspecified knee: Principal | ICD-10-CM | POA: Diagnosis present

## 2012-02-22 DIAGNOSIS — Z6832 Body mass index (BMI) 32.0-32.9, adult: Secondary | ICD-10-CM

## 2012-02-22 DIAGNOSIS — K219 Gastro-esophageal reflux disease without esophagitis: Secondary | ICD-10-CM | POA: Diagnosis present

## 2012-02-22 DIAGNOSIS — D62 Acute posthemorrhagic anemia: Secondary | ICD-10-CM | POA: Diagnosis not present

## 2012-02-22 DIAGNOSIS — M129 Arthropathy, unspecified: Secondary | ICD-10-CM | POA: Diagnosis present

## 2012-02-22 DIAGNOSIS — Z8249 Family history of ischemic heart disease and other diseases of the circulatory system: Secondary | ICD-10-CM

## 2012-02-22 DIAGNOSIS — Z8701 Personal history of pneumonia (recurrent): Secondary | ICD-10-CM

## 2012-02-22 HISTORY — DX: Pneumonia, unspecified organism: J18.9

## 2012-02-22 HISTORY — DX: Gastro-esophageal reflux disease without esophagitis: K21.9

## 2012-02-22 HISTORY — PX: KNEE SURGERY: SHX244

## 2012-02-22 HISTORY — PX: TOTAL KNEE ARTHROPLASTY: SHX125

## 2012-02-22 HISTORY — DX: Constipation, unspecified: K59.00

## 2012-02-22 SURGERY — ARTHROPLASTY, KNEE, TOTAL
Anesthesia: Regional | Site: Knee | Laterality: Right | Wound class: Clean

## 2012-02-22 MED ORDER — SODIUM CHLORIDE 0.9 % IR SOLN
Status: DC | PRN
  Administered 2012-02-22: 1000 mL

## 2012-02-22 MED ORDER — CHLORHEXIDINE GLUCONATE 4 % EX LIQD: 60.0000 mL | Freq: Once | CUTANEOUS | Status: DC

## 2012-02-22 MED ORDER — SODIUM CHLORIDE 0.9 % IV SOLN
INTRAVENOUS | Status: DC
  Administered 2012-02-22: 16:00:00 via INTRAVENOUS

## 2012-02-22 MED ORDER — CHLORHEXIDINE GLUCONATE 4 % EX LIQD: 60.0000 mL | Freq: Every day | CUTANEOUS | Status: DC

## 2012-02-22 MED ORDER — VALACYCLOVIR HCL 500 MG PO TABS
1000.0000 mg | ORAL_TABLET | Freq: Every day | ORAL | Status: DC
  Administered 2012-02-22 – 2012-02-24 (×3): 1000 mg via ORAL
  Filled 2012-02-22 (×4): qty 2

## 2012-02-22 MED ORDER — HYDROMORPHONE HCL PF 1 MG/ML IJ SOLN
0.5000 mg | INTRAMUSCULAR | Status: DC | PRN
  Administered 2012-02-22 – 2012-02-24 (×7): 0.5 mg via INTRAVENOUS
  Filled 2012-02-22 (×9): qty 1

## 2012-02-22 MED ORDER — PROPOFOL 10 MG/ML IV EMUL
INTRAVENOUS | Status: DC | PRN
  Administered 2012-02-22: 200 mg via INTRAVENOUS

## 2012-02-22 MED ORDER — METOCLOPRAMIDE HCL 10 MG PO TABS: 5.0000 mg | ORAL_TABLET | Freq: Three times a day (TID) | ORAL | Status: DC | PRN

## 2012-02-22 MED ORDER — ONDANSETRON HCL 4 MG/2ML IJ SOLN: 4.0000 mg | Freq: Once | INTRAMUSCULAR | Status: DC | PRN

## 2012-02-22 MED ORDER — SENNOSIDES-DOCUSATE SODIUM 8.6-50 MG PO TABS: 1.0000 | ORAL_TABLET | Freq: Every evening | ORAL | Status: DC | PRN

## 2012-02-22 MED ORDER — FENTANYL CITRATE 0.05 MG/ML IJ SOLN
INTRAMUSCULAR | Status: DC | PRN
  Administered 2012-02-22 (×2): 100 ug via INTRAVENOUS
  Administered 2012-02-22 (×3): 50 ug via INTRAVENOUS

## 2012-02-22 MED ORDER — ONDANSETRON HCL 4 MG/2ML IJ SOLN
INTRAMUSCULAR | Status: DC | PRN
  Administered 2012-02-22: 4 mg via INTRAVENOUS

## 2012-02-22 MED ORDER — BUPIVACAINE-EPINEPHRINE PF 0.25-1:200000 % IJ SOLN
INTRAMUSCULAR | Status: AC
  Filled 2012-02-22: qty 30

## 2012-02-22 MED ORDER — BISACODYL 10 MG RE SUPP: 10.0000 mg | Freq: Every day | RECTAL | Status: DC | PRN

## 2012-02-22 MED ORDER — SODIUM CHLORIDE 0.9 % IV SOLN: INTRAVENOUS | Status: DC

## 2012-02-22 MED ORDER — ROCURONIUM BROMIDE 100 MG/10ML IV SOLN
INTRAVENOUS | Status: DC | PRN
Start: 2012-02-22 — End: 2012-02-22
  Administered 2012-02-22: 10 mg via INTRAVENOUS
  Administered 2012-02-22: 40 mg via INTRAVENOUS

## 2012-02-22 MED ORDER — CEFAZOLIN SODIUM-DEXTROSE 2-3 GM-% IV SOLR
2.0000 g | INTRAVENOUS | Status: AC
  Administered 2012-02-22: 2 g via INTRAVENOUS

## 2012-02-22 MED ORDER — SENNA 8.6 MG PO TABS
1.0000 | ORAL_TABLET | Freq: Two times a day (BID) | ORAL | Status: DC
  Administered 2012-02-22 – 2012-02-24 (×4): 8.6 mg via ORAL
  Filled 2012-02-22 (×7): qty 1

## 2012-02-22 MED ORDER — PHENOL 1.4 % MT LIQD: 1.0000 | OROMUCOSAL | Status: DC | PRN

## 2012-02-22 MED ORDER — ACETAMINOPHEN 10 MG/ML IV SOLN
1000.0000 mg | Freq: Four times a day (QID) | INTRAVENOUS | Status: AC
  Administered 2012-02-22 – 2012-02-23 (×4): 1000 mg via INTRAVENOUS
  Filled 2012-02-22 (×4): qty 100

## 2012-02-22 MED ORDER — HYDROMORPHONE HCL PF 1 MG/ML IJ SOLN
INTRAMUSCULAR | Status: AC
  Filled 2012-02-22: qty 1

## 2012-02-22 MED ORDER — OXYCODONE HCL 5 MG PO TABS
5.0000 mg | ORAL_TABLET | ORAL | Status: DC | PRN
  Administered 2012-02-22 – 2012-02-24 (×11): 10 mg via ORAL
  Filled 2012-02-22 (×12): qty 2

## 2012-02-22 MED ORDER — METHOCARBAMOL 500 MG PO TABS
500.0000 mg | ORAL_TABLET | Freq: Four times a day (QID) | ORAL | Status: DC | PRN
  Administered 2012-02-22 – 2012-02-24 (×5): 500 mg via ORAL
  Filled 2012-02-22 (×6): qty 1

## 2012-02-22 MED ORDER — CEFAZOLIN SODIUM-DEXTROSE 2-3 GM-% IV SOLR
2.0000 g | Freq: Four times a day (QID) | INTRAVENOUS | Status: AC
  Administered 2012-02-22 (×2): 2 g via INTRAVENOUS
  Filled 2012-02-22 (×2): qty 50

## 2012-02-22 MED ORDER — MENTHOL 3 MG MT LOZG: 1.0000 | LOZENGE | OROMUCOSAL | Status: DC | PRN

## 2012-02-22 MED ORDER — ALUM & MAG HYDROXIDE-SIMETH 200-200-20 MG/5ML PO SUSP: 30.0000 mL | ORAL | Status: DC | PRN

## 2012-02-22 MED ORDER — ONDANSETRON HCL 4 MG PO TABS: 4.0000 mg | ORAL_TABLET | Freq: Four times a day (QID) | ORAL | Status: DC | PRN

## 2012-02-22 MED ORDER — NEOSTIGMINE METHYLSULFATE 1 MG/ML IJ SOLN
INTRAMUSCULAR | Status: DC | PRN
  Administered 2012-02-22: 5 mg via INTRAVENOUS

## 2012-02-22 MED ORDER — FLEET ENEMA 7-19 GM/118ML RE ENEM: 1.0000 | ENEMA | Freq: Once | RECTAL | Status: AC | PRN

## 2012-02-22 MED ORDER — LACTATED RINGERS IV SOLN
INTRAVENOUS | Status: DC | PRN
  Administered 2012-02-22 (×2): via INTRAVENOUS

## 2012-02-22 MED ORDER — METOCLOPRAMIDE HCL 5 MG/ML IJ SOLN
5.0000 mg | Freq: Three times a day (TID) | INTRAMUSCULAR | Status: DC | PRN
  Administered 2012-02-22: 10 mg via INTRAVENOUS
  Filled 2012-02-22: qty 2

## 2012-02-22 MED ORDER — FLUCONAZOLE 150 MG PO TABS
150.0000 mg | ORAL_TABLET | Freq: Every day | ORAL | Status: DC
  Administered 2012-02-22 – 2012-02-24 (×3): 150 mg via ORAL
  Filled 2012-02-22 (×4): qty 1

## 2012-02-22 MED ORDER — LIDOCAINE HCL (CARDIAC) 20 MG/ML IV SOLN
INTRAVENOUS | Status: DC | PRN
  Administered 2012-02-22: 100 mg via INTRAVENOUS

## 2012-02-22 MED ORDER — ACETAMINOPHEN 10 MG/ML IV SOLN
1000.0000 mg | Freq: Once | INTRAVENOUS | Status: DC
  Filled 2012-02-22: qty 100

## 2012-02-22 MED ORDER — BUPIVACAINE-EPINEPHRINE 0.25% -1:200000 IJ SOLN
INTRAMUSCULAR | Status: DC | PRN
  Administered 2012-02-22: 30 mL

## 2012-02-22 MED ORDER — KETOROLAC TROMETHAMINE 30 MG/ML IJ SOLN
INTRAMUSCULAR | Status: AC
  Administered 2012-02-22: 15 mg
  Filled 2012-02-22: qty 1

## 2012-02-22 MED ORDER — HYDROMORPHONE HCL PF 1 MG/ML IJ SOLN
0.2500 mg | INTRAMUSCULAR | Status: DC | PRN
  Administered 2012-02-22 (×4): 0.5 mg via INTRAVENOUS

## 2012-02-22 MED ORDER — ACETAMINOPHEN 10 MG/ML IV SOLN: 1000.0000 mg | Freq: Four times a day (QID) | INTRAVENOUS | Status: DC

## 2012-02-22 MED ORDER — GLYCOPYRROLATE 0.2 MG/ML IJ SOLN
INTRAMUSCULAR | Status: DC | PRN
  Administered 2012-02-22: .8 mg via INTRAVENOUS

## 2012-02-22 MED ORDER — SODIUM CHLORIDE 0.9 % IR SOLN
Status: DC | PRN
  Administered 2012-02-22: 3000 mL

## 2012-02-22 MED ORDER — ONDANSETRON HCL 4 MG/2ML IJ SOLN
4.0000 mg | Freq: Four times a day (QID) | INTRAMUSCULAR | Status: DC | PRN
  Administered 2012-02-22 – 2012-02-24 (×2): 4 mg via INTRAVENOUS
  Filled 2012-02-22 (×2): qty 2

## 2012-02-22 MED ORDER — VECURONIUM BROMIDE 10 MG IV SOLR
INTRAVENOUS | Status: DC | PRN
  Administered 2012-02-22: 1 mg via INTRAVENOUS

## 2012-02-22 MED ORDER — ACETAMINOPHEN 10 MG/ML IV SOLN
INTRAVENOUS | Status: AC
  Filled 2012-02-22: qty 100

## 2012-02-22 MED ORDER — METHOCARBAMOL 100 MG/ML IJ SOLN
500.0000 mg | Freq: Four times a day (QID) | INTRAVENOUS | Status: DC | PRN
  Administered 2012-02-22: 500 mg via INTRAVENOUS
  Filled 2012-02-22: qty 5

## 2012-02-22 MED ORDER — ACETAMINOPHEN 10 MG/ML IV SOLN
INTRAVENOUS | Status: DC | PRN
  Administered 2012-02-22: 1000 mg via INTRAVENOUS

## 2012-02-22 MED ORDER — RIVAROXABAN 10 MG PO TABS
10.0000 mg | ORAL_TABLET | Freq: Every day | ORAL | Status: DC
  Administered 2012-02-22 – 2012-02-24 (×3): 10 mg via ORAL
  Filled 2012-02-22 (×4): qty 1

## 2012-02-22 MED ORDER — KETOROLAC TROMETHAMINE 15 MG/ML IJ SOLN: 7.5000 mg | Freq: Four times a day (QID) | INTRAMUSCULAR | Status: DC

## 2012-02-22 SURGICAL SUPPLY — 59 items
BANDAGE ESMARK 6X9 LF (GAUZE/BANDAGES/DRESSINGS) ×1 IMPLANT
BLADE SAGITTAL 25.0X1.19X90 (BLADE) ×2 IMPLANT
BNDG CMPR 9X6 STRL LF SNTH (GAUZE/BANDAGES/DRESSINGS) ×1
BNDG ESMARK 6X9 LF (GAUZE/BANDAGES/DRESSINGS) ×2
BOWL SMART MIX CTS (DISPOSABLE) ×2 IMPLANT
CEMENT HV SMART SET (Cement) ×4 IMPLANT
CLOTH BEACON ORANGE TIMEOUT ST (SAFETY) ×2 IMPLANT
COVER BACK TABLE 24X17X13 BIG (DRAPES) ×2 IMPLANT
COVER SURGICAL LIGHT HANDLE (MISCELLANEOUS) ×2 IMPLANT
CUFF TOURNIQUET SINGLE 34IN LL (TOURNIQUET CUFF) ×2 IMPLANT
CUFF TOURNIQUET SINGLE 44IN (TOURNIQUET CUFF) IMPLANT
DRAPE EXTREMITY T 121X128X90 (DRAPE) ×2 IMPLANT
DRAPE PROXIMA HALF (DRAPES) ×2 IMPLANT
DRSG ADAPTIC 3X8 NADH LF (GAUZE/BANDAGES/DRESSINGS) ×2 IMPLANT
DRSG PAD ABDOMINAL 8X10 ST (GAUZE/BANDAGES/DRESSINGS) ×2 IMPLANT
DURAPREP 26ML APPLICATOR (WOUND CARE) ×2 IMPLANT
ELECT CAUTERY BLADE 6.4 (BLADE) ×2 IMPLANT
ELECT REM PT RETURN 9FT ADLT (ELECTROSURGICAL) ×2
ELECTRODE REM PT RTRN 9FT ADLT (ELECTROSURGICAL) ×1 IMPLANT
EVACUATOR 1/8 PVC DRAIN (DRAIN) ×2 IMPLANT
FACESHIELD LNG OPTICON STERILE (SAFETY) ×4 IMPLANT
FLOSEAL 10ML (HEMOSTASIS) ×2 IMPLANT
GLOVE BIOGEL PI IND STRL 8 (GLOVE) ×1 IMPLANT
GLOVE BIOGEL PI IND STRL 8.5 (GLOVE) ×1 IMPLANT
GLOVE BIOGEL PI INDICATOR 8 (GLOVE) ×1
GLOVE BIOGEL PI INDICATOR 8.5 (GLOVE) ×1
GLOVE ECLIPSE 8.0 STRL XLNG CF (GLOVE) ×4 IMPLANT
GLOVE SURG ORTHO 8.5 STRL (GLOVE) ×2 IMPLANT
GOWN PREVENTION PLUS XLARGE (GOWN DISPOSABLE) ×2 IMPLANT
GOWN STRL NON-REIN LRG LVL3 (GOWN DISPOSABLE) ×4 IMPLANT
HANDPIECE INTERPULSE COAX TIP (DISPOSABLE) ×2
KIT BASIN OR (CUSTOM PROCEDURE TRAY) ×2 IMPLANT
KIT ROOM TURNOVER OR (KITS) ×2 IMPLANT
MANIFOLD NEPTUNE II (INSTRUMENTS) ×2 IMPLANT
MARKER SPHERE PSV REFLC THRD 5 (MARKER) ×6 IMPLANT
NEEDLE 22X1 1/2 (OR ONLY) (NEEDLE) ×1 IMPLANT
NS IRRIG 1000ML POUR BTL (IV SOLUTION) ×2 IMPLANT
PACK TOTAL JOINT (CUSTOM PROCEDURE TRAY) ×2 IMPLANT
PAD ARMBOARD 7.5X6 YLW CONV (MISCELLANEOUS) ×4 IMPLANT
PAD CAST 4YDX4 CTTN HI CHSV (CAST SUPPLIES) ×1 IMPLANT
PADDING CAST COTTON 4X4 STRL (CAST SUPPLIES) ×2
PADDING CAST COTTON 6X4 STRL (CAST SUPPLIES) ×2 IMPLANT
PIN SCHANZ 4MM 130MM (PIN) IMPLANT
SET HNDPC FAN SPRY TIP SCT (DISPOSABLE) ×1 IMPLANT
SPONGE GAUZE 4X4 12PLY (GAUZE/BANDAGES/DRESSINGS) ×2 IMPLANT
STAPLER VISISTAT 35W (STAPLE) ×2 IMPLANT
SUCTION FRAZIER TIP 10 FR DISP (SUCTIONS) ×2 IMPLANT
SUT BONE WAX W31G (SUTURE) ×2 IMPLANT
SUT ETHIBOND NAB CT1 #1 30IN (SUTURE) ×6 IMPLANT
SUT MNCRL AB 3-0 PS2 18 (SUTURE) ×2 IMPLANT
SUT VIC AB 0 CT1 27 (SUTURE) ×2
SUT VIC AB 0 CT1 27XBRD ANBCTR (SUTURE) ×1 IMPLANT
SUT VIC AB 1 CT1 27 (SUTURE) ×2
SUT VIC AB 1 CT1 27XBRD ANBCTR (SUTURE) ×1 IMPLANT
SYR CONTROL 10ML LL (SYRINGE) ×2 IMPLANT
TOWEL OR 17X24 6PK STRL BLUE (TOWEL DISPOSABLE) ×2 IMPLANT
TOWEL OR 17X26 10 PK STRL BLUE (TOWEL DISPOSABLE) ×2 IMPLANT
TRAY FOLEY CATH 14FR (SET/KITS/TRAYS/PACK) ×1 IMPLANT
WATER STERILE IRR 1000ML POUR (IV SOLUTION) ×6 IMPLANT

## 2012-02-22 NOTE — Anesthesia Preprocedure Evaluation (Signed)
Anesthesia Evaluation  Patient identified by MRN, date of birth, ID band Patient awake    Reviewed: Allergy & Precautions, H&P , NPO status , Patient's Chart, lab work & pertinent test results  Airway Mallampati: I TM Distance: >3 FB Neck ROM: full    Dental   Pulmonary          Cardiovascular Rhythm:regular Rate:Normal     Neuro/Psych  Headaches,    GI/Hepatic GERD-  ,  Endo/Other    Renal/GU      Musculoskeletal   Abdominal   Peds  Hematology   Anesthesia Other Findings   Reproductive/Obstetrics                           Anesthesia Physical Anesthesia Plan  ASA: I  Anesthesia Plan: General   Post-op Pain Management:    Induction: Intravenous  Airway Management Planned: Oral ETT and LMA  Additional Equipment:   Intra-op Plan:   Post-operative Plan: Extubation in OR  Informed Consent: I have reviewed the patients History and Physical, chart, labs and discussed the procedure including the risks, benefits and alternatives for the proposed anesthesia with the patient or authorized representative who has indicated his/her understanding and acceptance.     Plan Discussed with: CRNA, Anesthesiologist and Surgeon  Anesthesia Plan Comments:         Anesthesia Quick Evaluation

## 2012-02-22 NOTE — Op Note (Signed)
PATIENT ID:      DAKAYLA DISANTI  MRN:     [id number] DOB/AGE:    Apr 17, 1966 / 46 y.o.       OPERATIVE REPORT    DATE OF PROCEDURE:  02/22/2012       PREOPERATIVE DIAGNOSIS:   osteoarthritis right knee                                                       BMI 32     POSTOPERATIVE DIAGNOSIS:   osteoarthritis right knee                                                                     There is no height or weight on file to calculate BMI.                                                          BMI 32  PROCEDURE:  Procedure(s): TOTAL KNEE ARTHROPLASTY     SURGEON:  Norlene Campbell, MD    ASSISTANT:   Jacqualine Code, PA-C   (Present and scrubbed throughout the case, critical for assistance with exposure, retraction, instrumentation, and closure.)          ANESTHESIA: Regional      DRAINS: (right knee) Hemovact drain(s) in the open with  Suction Open :      TOURNIQUET TIME:  Total Tourniquet Time Documented: Thigh (Right) - 73 minutes    COMPLICATIONS:  None   CONDITION:  stable  PROCEDURE IN DETAIL: [id number]   Kolbie Lepkowski W 02/22/2012, 9:32 AM

## 2012-02-22 NOTE — Progress Notes (Signed)
Total 150 cc bloody output in PACU from right knee hemovac

## 2012-02-22 NOTE — Plan of Care (Signed)
Problem: Consults Goal: Diagnosis- Total Joint Replacement Primary Total Knee Right     

## 2012-02-22 NOTE — Transfer of Care (Signed)
Immediate Anesthesia Transfer of Care Note  Patient: Cheyenne Graham  Procedure(s) Performed: Procedure(s) (LRB): TOTAL KNEE ARTHROPLASTY (Right)  Patient Location: PACU  Anesthesia Type: General  Level of Consciousness: awake, alert , oriented and patient cooperative  Airway & Oxygen Therapy: Patient Spontanous Breathing and Patient connected to nasal cannula oxygen  Post-op Assessment: Report given to PACU RN, Post -op Vital signs reviewed and stable and Patient moving all extremities  Post vital signs: Reviewed and stable  Complications: No apparent anesthesia complications

## 2012-02-22 NOTE — Progress Notes (Signed)
Patient ID: Cheyenne Graham, female   DOB: 02-16-1966, 46 y.o.   MRN: [id number] There has been no change in health status since  the current H&P.I have examined the patient and discussed the surgery. No contraindications to the planned procedure exist.

## 2012-02-22 NOTE — Op Note (Signed)
NAMEBRYTANI, Cheyenne Graham                 ACCOUNT NO.:  [id number]  MEDICAL RECORD NO.:  [id number]  LOCATION:  [facility]                         FACILITY:  [facility]  PHYSICIAN:  Claude Manges. Therese Rocco, M.D.DATE OF BIRTH:  Feb 03, 1966  DATE OF PROCEDURE:  02/22/2012 DATE OF DISCHARGE:                              OPERATIVE REPORT   PREOPERATIVE DIAGNOSES:  End-stage osteoarthritis, right knee, obesity. BMI 32.  POSTOPERATIVE DIAGNOSES:  End-stage osteoarthritis, right knee, obesity. BMI 32.  PROCEDURE:  Right total knee replacement.  SURGEON:  Claude Manges. Cleophas Dunker, MD  ASSISTANT:  Oris Drone. Petrarca, PA-C  ANESTHESIA:  General with supplemental femoral nerve block.  COMPLICATIONS:  None.  COMPONENTS:  DePuy LCS standard femoral component.  A #3 rotating keeled tibial tray, a 12.5-mm polyethylene bridging bearing of 3 pegged metal backed patella.  All components were secured with polymethyl methacrylate.  PROCEDURE:  [name] was met in the holding area, identified the right knee as the appropriate operative site.  She did receive a preoperative femoral nerve block.  The patient was then transported to room #7 and placed under general anesthesia without difficulty.  Foley catheter was inserted by the nursing staff.  Urine was clear.  Tourniquet was then applied to the right thigh.  The leg was then prepped with Betadine scrub and DuraPrep from the thigh tourniquet to the midfoot.  Sterile draping was performed.  Timeout was called.  With the extremity elevated, it was Esmarch exsanguinated with a proximal tourniquet at 350 mmHg.  A midline longitudinal incision was then made, centered about the patella extending from the superior pouch to the tibial tubercle.  Via sharp dissection, incision was carried down to the subcutaneous tissue. There were small bleeders were Bovie coagulated.  A medial parapatellar incision was made with the Bovie to the deep capsule with joint was entered, there  was a clear yellow joint effusion approximately 15 mL.  The patella was everted to 180 degrees laterally.  The knee was flexed to 90 degrees.  There were large osteophytes along the medial and lateral femoral condyle.  There was complete absence of articular cartilage on the medial femoral condyle with the irregularity and deformity of the condyle.  There were large osteophytes peripherally along the medial tibial plateau.  We measured a standard femoral component.  First bony cut was then made transversely on the tibia with the external tibial guide.  At each bony cut, we checked our alignment with the alignment jig and thought we were in excellent position.  Subsequent cuts were then made on the femur using 12.5-mm flexion extension gaps.  MCL and LCL remained intact throughout the procedure.  Lamina spreaders were inserted along the medial and lateral compartments to remove medial and lateral menisci and ACL and PCL.  A 3/4-inch osteotome was used to remove the osteophytes in the posterior femoral condyles.  A 4-degree distal femoral valgus cut was made.  Final cut was made on the tibia for tapering and to obtain the center holes.  Retractors were then placed around the tibia.  We measured a #3 tibial tray.  This was pinned in place.  The center hole was made followed by the  keeled cut.  With the trial tibial jig in place, we inserted the 12.5-mm bridging bearing followed by the trial femoral component.  We had full and slight hyperextension.  There was no opening with varus or valgus stress.  We had excellent rotation of the tibial tray without malalignment.  Patella was prepared by removing 10 mm of bone leaving 13 mm of patella thickness.  Three holes were then made with the trial jig.  Trial patella was inserted and through a full range of motion, it remained stable.  The trial components were removed.  The joint was copiously irrigated with saline solution.  We injected  0.25% Marcaine with epinephrine while waiting for the components for the poly methacrylate to mature.  We initially inserted the tibial tray followed by the polyethylene bearing and then the tibial component.  Extraneous methacrylate was removed from their edges.  Patella was applied with methacrylate and the patellar clamp.  At approximately 16 minutes, the methacrylate had hardened.  We checked the joint and there was no further extraneous methacrylate.  The tourniquet was deflated.  Gross bleeders were Bovie coagulated.  We had nice dry field.  A medium-size Hemovac was then inserted.  The deep capsule was closed with interrupted #1 Ethibond, superficial capsule was closed with running 0 Vicryl, subcu with 3-0 Monocryl and skin with skin clips.  Sterile bulky dressing was applied followed by the patient's support stocking.  The patient tolerated the procedure without complication.     Claude Manges. Cleophas Dunker, M.D.     PWW/MEDQ  D:  02/22/2012  T:  02/22/2012  Job:  [id number]

## 2012-02-22 NOTE — Anesthesia Procedure Notes (Addendum)
Performed by: Jerilee Hoh   Procedure Name: Intubation Date/Time: 02/22/2012 7:44 AM Performed by: Jerilee Hoh Pre-anesthesia Checklist: Patient identified, Timeout performed, Emergency Drugs available, Suction available and Patient being monitored Patient Re-evaluated:Patient Re-evaluated prior to inductionOxygen Delivery Method: Circle system utilized Preoxygenation: Pre-oxygenation with 100% oxygen Intubation Type: IV induction Ventilation: Mask ventilation without difficulty Laryngoscope Size: Mac and 3 Grade View: Grade I Tube type: Oral Tube size: 7.0 mm Number of attempts: 1 Airway Equipment and Method: Stylet and LTA kit utilized Placement Confirmation: breath sounds checked- equal and bilateral,  ETT inserted through vocal cords under direct vision and positive ETCO2 Secured at: 21 cm Tube secured with: Tape Dental Injury: Teeth and Oropharynx as per pre-operative assessment

## 2012-02-22 NOTE — Anesthesia Postprocedure Evaluation (Signed)
  Anesthesia Post-op Note  Patient: Cheyenne Graham  Procedure(s) Performed: Procedure(s) (LRB): TOTAL KNEE ARTHROPLASTY (Right)  Patient Location: PACU  Anesthesia Type: GA combined with regional for post-op pain  Level of Consciousness: awake, oriented, sedated and patient cooperative  Airway and Oxygen Therapy: Patient Spontanous Breathing and Patient connected to nasal cannula oxygen  Post-op Pain: moderate  Post-op Assessment: Post-op Vital signs reviewed, Patient's Cardiovascular Status Stable, Respiratory Function Stable, Patent Airway, No signs of Nausea or vomiting and Pain level not controlled  Post-op Vital Signs: stable  Complications: No apparent anesthesia complications

## 2012-02-22 NOTE — Progress Notes (Signed)
Orthopedic Tech Progress Note Patient Details:  Cheyenne Graham 1966/05/09 [id number]  CPM Right Knee CPM Right Knee: On Right Knee Flexion (Degrees): 60  Right Knee Extension (Degrees): 0    [name] T 02/22/2012, 11:09 AM

## 2012-02-22 NOTE — Progress Notes (Signed)
UR COMPLETED  

## 2012-02-22 NOTE — Preoperative (Signed)
Beta Blockers   Reason not to administer Beta Blockers:Not Applicable 

## 2012-02-23 ENCOUNTER — Encounter (HOSPITAL_COMMUNITY): Payer: Self-pay | Admitting: Orthopaedic Surgery

## 2012-02-23 LAB — BASIC METABOLIC PANEL
Calcium: 7.9 mg/dL — ABNORMAL LOW (ref 8.4–10.5)
GFR calc Af Amer: >90 mL/min (ref >90)
GFR calc non Af Amer: >90 mL/min (ref >90)
Glucose, Bld: 103 mg/dL — ABNORMAL HIGH (ref 70–99)
Potassium: 3.6 mEq/L (ref 3.5–5.1)
Sodium: 140 mEq/L (ref 135–145)

## 2012-02-23 LAB — CBC
Hemoglobin: 10.1 g/dL — ABNORMAL LOW (ref 12.0–15.0)
MCH: 29 pg (ref 26.0–34.0)
MCHC: 33.3 g/dL (ref 30.0–36.0)
Platelets: 245 10*3/uL (ref 150–400)

## 2012-02-23 MED ORDER — ACETAMINOPHEN 10 MG/ML IV SOLN
1000.0000 mg | Freq: Four times a day (QID) | INTRAVENOUS | Status: AC
  Administered 2012-02-23 – 2012-02-24 (×4): 1000 mg via INTRAVENOUS
  Filled 2012-02-23 (×5): qty 100

## 2012-02-23 NOTE — Clinical Social Work Note (Signed)
Clinical Social Worker received standing order for SNF placement for patient.  Per PT/OT notes and RN, patient plans to return home with home health services at discharge.  Patient refused inpatient rehab option as well at this time.  Referral inappropriate at this time due to patient current discharge needs.  No further social work needs identified at this time.  Please re-consult if needs arise.  [address] [place], [place] [phone]

## 2012-02-23 NOTE — Progress Notes (Signed)
Physical Therapy Treatment Note   02/23/12 1352  PT Visit Information  Last PT Received On 03/01/12  Assistance Needed +1  PT Time Calculation  PT Start Time 1352  PT Stop Time 1415  PT Time Calculation (min) 23 min  Subjective Data  Subjective Pt received supine in bed agreeable to PT  Precautions  Precautions Knee  Restrictions  RLE Weight Bearing PWB  RLE Partial Weight Bearing Percentage or Pounds 50  Cognition  Overall Cognitive Status Appears within functional limits for tasks assessed/performed  Arousal/Alertness Awake/alert  Orientation Level Appears intact for tasks assessed  Behavior During Session [facility] for tasks performed  Bed Mobility  Bed Mobility Sit to Supine  Sit to Supine 4: Min assist;HOB flat  Details for Bed Mobility Assistance assist for R LE  Transfers  Transfers Sit to Stand;Stand to Sit  Sit to Stand 4: Min assist;With upper extremity assist;From chair/3-in-1;With armrests  Stand to Sit 4: Min guard;With armrests;To chair/3-in-1;With upper extremity assist  Details for Transfer Assistance pt demo'd good technique  Ambulation/Gait  Ambulation/Gait Assistance 4: Min guard  Ambulation Distance (Feet) 60 Feet  Assistive device Rolling walker  Ambulation/Gait Assistance Details pt with improved sequencing and ability to WB on R LE up to 50%, v/c's to WB with foot flat compared to ball of foot  Gait Pattern Step-to pattern;Decreased step length - right;Decreased stance time - right;Antalgic  Gait velocity slow  Exercises  Exercises Total Joint  Total Joint Exercises  Ankle Circles/Pumps AROM;Both;10 reps;Supine  Quad Sets AROM;Right;10 reps;Supine  Heel Slides AROM;Right;10 reps;Seated (3 passive knee flexion stretches)  Long Arc Quad (pt with minimal ability to initiate)  PT - End of Session  Equipment Utilized During Treatment Gait belt  Activity Tolerance Patient tolerated treatment well  Patient left in bed;in CPM;with call bell/phone within  reach;with family/visitor present (CPM set 0-60 deg)  Nurse Communication Mobility status  PT - Assessment/Plan  Comments on Treatment Session Pt with improved ambulation tolerance this date. Anticpate pt to be ready for d/c home tomorrow with 24/7 assist, recommended DME and HHPT.  PT Plan Discharge plan remains appropriate;Frequency remains appropriate  PT Frequency 7X/week  Follow Up Recommendations Home health PT;Supervision/Assistance - 24 hour  Acute Rehab PT Goals  PT Goal Formulation With patient  Time For Goal Achievement 03/01/12  Potential to Achieve Goals Good  PT Goal: Sit to Stand - Progress Progressing toward goal  PT Goal: Ambulate - Progress Progressing toward goal  PT Goal: Perform Home Exercise Program - Progress Met    Pain: 7/10 R knee pain with flexion  Lewis Shock, PT, DPT Pager #: [phone] Office #: [phone]

## 2012-02-23 NOTE — Evaluation (Signed)
Physical Therapy Evaluation Patient Details Name: Cheyenne Graham MRN: [id number] DOB: 1966/02/25 Today's Date: 02/23/2012 Time: 6433-2951 PT Time Calculation (min): 33 min  PT Assessment / Plan / Recommendation Clinical Impression  Pt s/p R TKA presenting with increased R knee pain, decreased R LE strength and knee ROM. Anticipate patient to be able to return home with 24/7 supervision/assist, RW, and HHPT. Patient to benefit from con't PT services to maximize functional recovery.    PT Assessment  Patient needs continued PT services    Follow Up Recommendations  Home health PT;Supervision/Assistance - 24 hour    Barriers to Discharge None      lEquipment Recommendations   (per patient RW and BSC delivered)    Recommendations for Other Services     Frequency 7X/week    Precautions / Restrictions Precautions Precautions: Knee Restrictions Weight Bearing Restrictions: Yes RLE Weight Bearing: Partial weight bearing RLE Partial Weight Bearing Percentage or Pounds: 50%   Pertinent Vitals/Pain 4/10 R knee pain initially, 7/10 R knee pain s/p ambulation      Mobility  Bed Mobility Bed Mobility: Supine to Sit Supine to Sit: 4: Min assist;HOB flat Details for Bed Mobility Assistance: minA for R LE, v/c's for technique Transfers Transfers: Sit to Stand;Stand to Sit Sit to Stand: 4: Min assist;From bed;With upper extremity assist Stand to Sit: 4: Min guard;With upper extremity assist;To chair/3-in-1 Details for Transfer Assistance: v/c's for technique, hand placement and R LE mangement Ambulation/Gait Ambulation/Gait Assistance: 4: Min assist Ambulation Distance (Feet): 30 Feet Assistive device: Rolling walker Ambulation/Gait Assistance Details: v/c's for walker sequencing, R LE PWB 50% Gait Pattern: Step-to pattern;Decreased step length - right;Decreased stance time - right;Antalgic Gait velocity: slow Stairs: No    Exercises Total Joint Exercises Ankle Circles/Pumps:  AROM;Both;10 reps;Supine Quad Sets: AROM;Right;10 reps;Supine Heel Slides: Right;AAROM;10 reps;Supine Goniometric ROM: 20 deg AA R knee ROM   PT Diagnosis: Difficulty walking;Abnormality of gait;Generalized weakness;Acute pain  PT Problem List: Decreased strength;Decreased range of motion;Decreased activity tolerance;Decreased balance;Decreased mobility PT Treatment Interventions: DME instruction;Gait training;Stair training;Functional mobility training;Therapeutic activities;Therapeutic exercise   PT Goals Acute Rehab PT Goals PT Goal Formulation: With patient Time For Goal Achievement: 03/01/12 Potential to Achieve Goals: Good Pt will go Supine/Side to Sit: with modified independence;with HOB 0 degrees PT Goal: Supine/Side to Sit - Progress: Goal set today Pt will go Sit to Stand: with modified independence PT Goal: Sit to Stand - Progress: Goal set today Pt will Ambulate: >150 feet;with modified independence;with rolling walker PT Goal: Ambulate - Progress: Goal set today Pt will Go Up / Down Stairs: 1-2 stairs;with supervision;with rolling walker PT Goal: Up/Down Stairs - Progress: Goal set today Pt will Perform Home Exercise Program: Independently PT Goal: Perform Home Exercise Program - Progress: Goal set today  Visit Information  Last PT Received On: 02/23/12 Assistance Needed: +1    Subjective Data  Subjective: Pt received supine in bed with report of 4/10 R knee pain. Patient Stated Goal: home   Prior Functioning  Home Living Lives With: Significant other;Family Available Help at Discharge: Family;Available 24 hours/day Type of Home: House Home Access: Stairs to enter [employer] of Steps: 1 Entrance Stairs-Rails: None Home Layout: Able to live on main level with bedroom/bathroom Bathroom Shower/Tub: Health visitor: Standard Bathroom Accessibility: Yes How Accessible: Accessible via walker Home Adaptive Equipment: Bedside  commode/3-in-1;Walker - rolling Prior Function Level of Independence: Independent Able to Take Stairs?: Yes Driving: Yes Vocation: Full time employment Comments: pt with standing  job Musician: No difficulties Dominant Hand: Right    Cognition  Overall Cognitive Status: Appears within functional limits for tasks assessed/performed Arousal/Alertness: Awake/alert Orientation Level: Appears intact for tasks assessed Behavior During Session: [facility] for tasks performed    Extremity/Trunk Assessment Right Upper Extremity Assessment RUE ROM/Strength/Tone: Within functional levels Left Upper Extremity Assessment LUE ROM/Strength/Tone: Within functional levels Right Lower Extremity Assessment RLE ROM/Strength/Tone: Deficits;Due to pain RLE ROM/Strength/Tone Deficits: no quad set, minimal R knee active flex Left Lower Extremity Assessment LLE ROM/Strength/Tone: Within functional levels Trunk Assessment Trunk Assessment: Normal   Balance Balance Balance Assessed: No  End of Session PT - End of Session Equipment Utilized During Treatment: Gait belt Activity Tolerance: Patient limited by pain;Patient limited by fatigue Patient left: in chair;with call bell/phone within reach;with family/visitor present Nurse Communication: Mobility status   Marcene Brawn 02/23/2012, 9:03 AM  Lewis Shock, PT, DPT Pager #: [phone] Office #: [phone]

## 2012-02-23 NOTE — Evaluation (Signed)
Occupational Therapy Evaluation Patient Details Name: Cheyenne Graham MRN: [id number] DOB: 1965-09-26 Today's Date: 02/23/2012 Time: 2952-8413 OT Time Calculation (min): 21 min  OT Assessment / Plan / Recommendation Clinical Impression  Pt doing well POD 1 RTKR. Skilled OT recommended to maximize I w/BADLs to supervision level in prep for safe d/c home with prn A from family.    OT Assessment  Patient needs continued OT Services    Follow Up Recommendations       Barriers to Discharge      Equipment Recommendations   (per patient RW and BSC delivered)    Recommendations for Other Services    Frequency  Min 2X/week    Precautions / Restrictions Precautions Precautions: Knee Restrictions Weight Bearing Restrictions: Yes RLE Weight Bearing: Partial weight bearing RLE Partial Weight Bearing Percentage or Pounds: 50%   Pertinent Vitals/Pain Pt reporting 8/10 pain. Pt was premedicated.    ADL  Grooming: Performed;Brushing hair;Wash/dry face;Set up Where Assessed - Grooming: Supported sitting Upper Body Bathing: Performed;Set up Where Assessed - Upper Body Bathing: Unsupported sitting Lower Body Bathing: Performed;Minimal assistance (for lower legs and feet.) Where Assessed - Lower Body Bathing: Supported sit to stand Upper Body Dressing: Simulated;Set up Where Assessed - Upper Body Dressing: Unsupported sitting Lower Body Dressing: Simulated;Moderate assistance Where Assessed - Lower Body Dressing: Sopported sit to stand Toilet Transfer: Simulated;Minimal assistance Toilet Transfer Method: Sit to stand Toilet Transfer Equipment: Other (comment) Nurse, children's) Toileting - Clothing Manipulation and Hygiene: Simulated;Minimal assistance Where Assessed - Engineer, mining and Hygiene: Standing Equipment Used: Rolling walker ADL Comments: Pt mobilizing well despite being limited by pain.    OT Diagnosis: Generalized weakness  OT Problem List: Decreased activity  tolerance;Decreased knowledge of use of DME or AE;Pain OT Treatment Interventions: Self-care/ADL training;Therapeutic activities;DME and/or AE instruction;Patient/family education   OT Goals Acute Rehab OT Goals OT Goal Formulation: With patient Time For Goal Achievement: 03/01/12 Potential to Achieve Goals: Good ADL Goals Pt Will Perform Lower Body Bathing: with supervision;Standing at sink ADL Goal: Lower Body Bathing - Progress: Goal set today Pt Will Perform Lower Body Dressing: with supervision;Sit to stand from bed;Sit to stand from chair ADL Goal: Lower Body Dressing - Progress: Goal set today Pt Will Transfer to Toilet: with supervision;3-in-1;Comfort height toilet ADL Goal: Toilet Transfer - Progress: Goal set today Pt Will Perform Toileting - Clothing Manipulation: with supervision;Standing ADL Goal: Toileting - Clothing Manipulation - Progress: Goal set today Pt Will Perform Toileting - Hygiene: with supervision;Sit to stand from 3-in-1/toilet ADL Goal: Toileting - Hygiene - Progress: Goal set today Pt Will Perform Tub/Shower Transfer: Shower transfer;with supervision;Maintaining weight bearing status ADL Goal: Tub/Shower Transfer - Progress: Goal set today  Visit Information  Last OT Received On: 02/23/12 Assistance Needed: +1    Subjective Data  Subjective: This is so painful. Patient Stated Goal: Return home with assistance from family.   Prior Functioning  Home Living Lives With: Family;Significant other Available Help at Discharge: Family;Available 24 hours/day Type of Home: House Home Access: Stairs to enter [employer] of Steps: 1 Entrance Stairs-Rails: None Home Layout: Able to live on main level with bedroom/bathroom Bathroom Shower/Tub: Health visitor: Standard Bathroom Accessibility: Yes How Accessible: Accessible via walker Home Adaptive Equipment: Bedside commode/3-in-1;Walker - rolling Prior Function Level of  Independence: Independent Able to Take Stairs?: Yes Driving: Yes Vocation: Full time employment Comments: pt with standing job Communication Communication: No difficulties Dominant Hand: Right    Cognition  Overall Cognitive Status: Appears  within functional limits for tasks assessed/performed Arousal/Alertness: Awake/alert Orientation Level: Appears intact for tasks assessed Behavior During Session: [facility] for tasks performed    Extremity/Trunk Assessment Right Upper Extremity Assessment RUE ROM/Strength/Tone: [place] for tasks assessed Left Upper Extremity Assessment LUE ROM/Strength/Tone: [facility] for tasks assessed  Trunk Assessment Trunk Assessment: Normal   Mobility Bed Mobility Bed Mobility: Supine to Sit Supine to Sit: 4: Min assist;HOB flat Details for Bed Mobility Assistance: minA for R LE, v/c's for technique Transfers Sit to Stand: 4: Min assist;With upper extremity assist;From chair/3-in-1;With armrests Stand to Sit: 4: Min guard;With armrests;To chair/3-in-1;With upper extremity assist Details for Transfer Assistance: min VCs for hand placement and LE position.   Exercise   Balance Balance Balance Assessed: No  End of Session OT - End of Session Activity Tolerance: Patient limited by pain Patient left: in chair;with call bell/phone within reach   Bethaney Oshana A OTR/L [phone] 02/23/2012, 11:40 AM

## 2012-02-23 NOTE — Progress Notes (Signed)
Patient ID: Cheyenne Graham, female   DOB: 1966/01/27, 46 y.o.   MRN: [id number] PATIENT ID: Cheyenne Graham        MRN:  [id number]          DOB/AGE: 1966/01/11 / 46 y.o.  Cheyenne Campbell, MD   Jacqualine Code, PA-C [address] [place], [place], [place]  [address]                             [phone]   PROGRESS NOTE  Subjective:  negative for Chest Pain  negative for Shortness of Breath  negative for Nausea/Vomiting   negative for Calf Pain  negative for Bowel Movement   Tolerating Diet: yes         Patient reports pain as mild and moderate.     C/O weakness in both upper extremities.  Pain tolerable.  Objective: Vital signs in last 24 hours:   Patient Vitals for the past 24 hrs:  BP Temp Temp src Pulse Resp SpO2 Height Weight  02/23/12 0522 113/64 mmHg 98.3 F (36.8 C) - 65  18  100 % - -  02/22/12 2136 110/59 mmHg 97.6 F (36.4 C) - 55  16  100 % - -  02/22/12 1826 104/61 mmHg 97.8 F (36.6 C) - 70  16  100 % - -  02/22/12 1600 - - - - 18  - - -  02/22/12 1300 117/72 mmHg 97.4 F (36.3 C) - 60  16  100 % - -  02/22/12 1145 120/81 mmHg 97.4 F (36.3 C) Oral 59  14  100 % 5\' 5"  (1.651 m) 92.987 kg (205 lb)  02/22/12 1130 125/71 mmHg 98 F (36.7 C) - 63  14  99 % - -  02/22/12 1110 126/74 mmHg - - - - - - -  02/22/12 1105 - - - 59  14  100 % - -  02/22/12 1100 - - - 55  12  100 % - -  02/22/12 1055 138/83 mmHg - - - - - - -  02/22/12 1051 - - - 71  17  100 % - -  02/22/12 1047 - - - 60  15  100 % - -  02/22/12 1040 140/82 mmHg - - - - - - -  02/22/12 1035 - - - 58  17  100 % - -  02/22/12 1030 - - - 77  21  99 % - -  02/22/12 1025 142/76 mmHg - - - - - - -  02/22/12 1015 - - - 67  25  99 % - -  02/22/12 1010 144/73 mmHg - - - - - - -  02/22/12 1009 - 97.8 F (36.6 C) - - - - - -      Intake/Output from previous day:   06/18 0701 - 06/19 0700 In: 2555 [P.O.:480; I.V.:2075] Out: 3085 [Urine:2685; Drains:350]   Intake/Output this shift:       Intake/Output    06/18 0701 - 06/19 0700 06/19 0701 - 06/20 0700   P.O. 480    I.V. (mL/kg) 2075 (22.3)    Total Intake(mL/kg) 2555 (27.5)    Urine (mL/kg/hr) 2685 (1.2)    Drains 350    Blood 50    Total Output 3085    Net -530            LABORATORY DATA:  Basename 02/23/12 0558  WBC 4.5  HGB 10.1*  HCT 30.3*  PLT 245    Basename 02/23/12 0558  NA 140  K 3.6  CL 107  CO2 24  BUN 8  CREATININE 0.70  GLUCOSE 103*  CALCIUM 7.9*   Lab Results  Component Value Date   INR 0.98 02/15/2012    Examination:  General appearance: alert, cooperative, mild distress and moderate distress Resp: clear to auscultation bilaterally Cardio: regular rate and rhythm GI: normal findings: bowel sounds normal but slightly decreased  Wound Exam: clean, dry, intact dressing  Drainage:  None: wound tissue dry  Motor Exam: EHL, FHL, Anterior Tibial and Posterior Tibial Intact  Sensory Exam: Superficial Peroneal, Deep Peroneal and Tibial normal.  Sensation intact to light touch in upper extremities.  Slight decrease in grip bilaterally   Vascular Exam: Right dorsalis pedis artery has 1+ (weak) pulse   Good radial pulses  Assessment:    1 Day Post-Op  Procedure(s) (LRB): TOTAL KNEE ARTHROPLASTY (Right)  ADDITIONAL DIAGNOSIS:  Principal Problem:  *Osteoarthritis of knee  Acute Blood Loss Anemia   Plan: Physical Therapy as ordered Partial Weight Bearing @ 50% (PWB)  DVT Prophylaxis:  Xarelto, Foot Pumps and TED hose  DISCHARGE PLAN: Home  DISCHARGE NEEDS: HHPT, CPM, Walker and 3-in-1 comode seat  Will watch upper extremity weakness         Jabree Rebert 02/23/2012, 8:37 AM

## 2012-02-24 DIAGNOSIS — E669 Obesity, unspecified: Secondary | ICD-10-CM | POA: Diagnosis present

## 2012-02-24 DIAGNOSIS — D62 Acute posthemorrhagic anemia: Secondary | ICD-10-CM | POA: Diagnosis not present

## 2012-02-24 LAB — BASIC METABOLIC PANEL
BUN: 7 mg/dL (ref 6–23)
Calcium: 8.1 mg/dL — ABNORMAL LOW (ref 8.4–10.5)
Creatinine, Ser: 0.67 mg/dL (ref 0.50–1.10)
GFR calc Af Amer: >90 mL/min (ref >90)
GFR calc non Af Amer: >90 mL/min (ref >90)
Potassium: 3.7 mEq/L (ref 3.5–5.1)

## 2012-02-24 LAB — CBC
HCT: 29.8 % — ABNORMAL LOW (ref 36.0–46.0)
MCHC: 33.6 g/dL (ref 30.0–36.0)
RDW: 13.6 % (ref 11.5–15.5)

## 2012-02-24 MED ORDER — METHOCARBAMOL 500 MG PO TABS: 500.0000 mg | ORAL_TABLET | Freq: Four times a day (QID) | ORAL | Status: AC | PRN

## 2012-02-24 MED ORDER — RIVAROXABAN 10 MG PO TABS: 10.0000 mg | ORAL_TABLET | Freq: Every day | ORAL | Status: DC

## 2012-02-24 MED ORDER — OXYCODONE HCL 5 MG PO TABS: 5.0000 mg | ORAL_TABLET | ORAL | Status: AC | PRN

## 2012-02-24 NOTE — Progress Notes (Signed)
Physical Therapy Treatment Note   02/24/12 1409  PT Visit Information  Last PT Received On 02/24/12  Assistance Needed +1  PT Time Calculation  PT Start Time 1409  PT Stop Time 1418  PT Time Calculation (min) 9 min  Subjective Data  Subjective Pt received sitting EOB with report "I"m leaving soon." pt c/o 9/10 R knee pain.  Precautions  Precautions Knee  Restrictions  RLE Weight Bearing PWB  RLE Partial Weight Bearing Percentage or Pounds 50  PT - Assessment/Plan  Comments on Treatment Session Focused on extensive education on car transfer due to patient with increased anxiety regarding R knee pain with transfer. Patient and family with verbal confirmation of understanding of safe car transfer. Attempted to complete AA R knee flexion however patient with increased pain. RN made aware however unable to provide pain medication at this time.  PT General Charges  $$ ACUTE PT VISIT 1 Procedure  PT Treatments  $Therapeutic Activity 8-22 mins     Pain: 9/10 R knee pain  Lewis Shock, PT, DPT Pager #: [phone] Office #: [phone]

## 2012-02-24 NOTE — Progress Notes (Signed)
Occupational Therapy Treatment Patient Details Name: FLORNCE RECORD MRN: [id number] DOB: 07-04-1966 Today's Date: 02/24/2012 Time: 7829-5621 OT Time Calculation (min): 10 min  OT Assessment / Plan / Recommendation Comments on Treatment Session Pt limited this session by pain. D/C planned for today. Recommended pt spongebathe until able to safely step into the shower.    Follow Up Recommendations       Barriers to Discharge       Equipment Recommendations  Other (comment) (equip already delivered.)    Recommendations for Other Services    Frequency Min 2X/week   Plan Discharge plan remains appropriate    Precautions / Restrictions Precautions Precautions: Knee Restrictions Weight Bearing Restrictions: Yes RLE Weight Bearing: Partial weight bearing   Pertinent Vitals/Pain Reported 6/10 pain with activity.    ADL  Grooming: Performed;Wash/dry hands;Teeth care;Min guard Where Assessed - Grooming: Supported standing Toilet Transfer: Performed;Minimal Dentist Method: Sit to Barista: Raised toilet seat with arms (or 3-in-1 over toilet) Toileting - Clothing Manipulation and Hygiene: Performed;Supervision/safety Where Assessed - Engineer, mining and Hygiene: Sit to stand from 3-in-1 or toilet Tub/Shower Transfer: Performed;Minimal assistance Tub/Shower Transfer Method: Science writer: Walk in shower Equipment Used: Rolling walker ADL Comments: Pt required max VCs for safe technique stepping into walk in shower. Pt limited by pain today.    OT Diagnosis:    OT Problem List:   OT Treatment Interventions:     OT Goals ADL Goals ADL Goal: Toilet Transfer - Progress: Progressing toward goals ADL Goal: Toileting - Clothing Manipulation - Progress: Progressing toward goals ADL Goal: Toileting - Hygiene - Progress: Progressing toward goals ADL Goal: Tub/Shower Transfer - Progress: Progressing toward  goals  Visit Information  Last OT Received On: 02/24/12 Assistance Needed: +1    Subjective Data  Subjective: It hurts worse today than it did yesterday.   Prior Functioning       Cognition  Overall Cognitive Status: Appears within functional limits for tasks assessed/performed Arousal/Alertness: Awake/alert Orientation Level: Appears intact for tasks assessed Behavior During Session: [facility] for tasks performed    Mobility Transfers Sit to Stand: 4: Min assist;With upper extremity assist;From chair/3-in-1 Details for Transfer Assistance: Pt demo'd good technique.   Exercises    Balance    End of Session OT - End of Session Activity Tolerance: Patient limited by pain Patient left: Other (comment) (ambulating with PT.)   Katlyn Muldrew A OTR/L [phone] 02/24/2012, 10:37 AM

## 2012-02-24 NOTE — Discharge Summary (Signed)
Norlene Campbell, MD   Jacqualine Code, PA-C [address] [place], [place], [place]  [address]                             [phone]  PATIENT ID: Cheyenne Graham        MRN:  [id number]          DOB/AGE: [birth year] / 46 y.o.    DISCHARGE SUMMARY  ADMISSION DATE:    02/22/2012 DISCHARGE DATE:   02/24/2012   ADMISSION DIAGNOSIS: osteoarthritis right knee    DISCHARGE DIAGNOSIS:  osteoarthritis right knee    ADDITIONAL DIAGNOSIS: Active Problems:  Postoperative anemia due to acute blood loss  Obesity (BMI 30-39.9)  Past Medical History  Diagnosis Date  . Arthritis   . Pneumonia        . GERD (gastroesophageal reflux disease)     decreased since she is not taking advail  . Constipation   . Migraine      2 a week  . Migraine     PROCEDURE: Procedure(s): RIGHT TOTAL KNEE ARTHROPLASTY on 02/22/2012  CONSULTS:   NONE  HISTORY: Cheyenne Graham is a very pleasant 46 year old white female who is seen today for evaluation of her right knee. She has had a history of right knee arthritis dating back to at least 2007. She has had corticosteroid injections as well as Hyalgan injections. She has actually had both knees that have been quite symptomatic. However more recently though the right knee has worsened. She did have a hiatus where she was treated conservatively and had been using Mobic and ibuprofen. She has more recently been noted to have worsening pain now in her right knee. She did have an arthroscopy back in 2006 on the right knee. She is now having this constant extremely severe sharp, stabbing pain at times but more of a throbbing, aching and burning pain. She has had effusions especially with work. She works at the [employer] and tends to be on her feet a lot. She noticed some weakness in the right knee. Both her knees wake her at nighttime. Activity and exercise and especially work worsens her symptoms. Rest has been helpful. She has been tried with corticosteroid as well as Hyalgan  injections and really has gotten to the point now where she states these are not of benefit to her. Again, she is having difficulty at nighttime   HOSPITAL COURSE:  Cheyenne Graham is a 46 y.o. admitted on 02/22/2012 and found to have a diagnosis of osteoarthritis right knee.  After appropriate laboratory studies were obtained  they were taken to the operating room on 02/22/2012 and underwent Procedure(s): RIGHT TOTAL KNEE ARTHROPLASTY.   They were given perioperative antibiotics:  Anti-infectives     Start     Dose/Rate Route Frequency Ordered Stop   02/22/12 1400   ceFAZolin (ANCEF) IVPB 2 g/50 mL premix        2 g 100 mL/hr over 30 Minutes Intravenous Every 6 hours 02/22/12 1202 02/22/12 2014   02/22/12 1215   fluconazole (DIFLUCAN) tablet 150 mg        150 mg Oral Daily 02/22/12 1202     02/22/12 1215   valACYclovir (VALTREX) tablet 1,000 mg        1,000 mg Oral Daily 02/22/12 1202     02/22/12 0620   ceFAZolin (ANCEF) IVPB 2 g/50 mL premix        2 g  100 mL/hr over 30 Minutes Intravenous 60 min pre-op 02/22/12 0620 02/22/12 0744   02/21/12 1444   ceFAZolin (ANCEF) IVPB 2 g/50 mL premix  Status:  Discontinued        2 g 100 mL/hr over 30 Minutes Intravenous 60 min pre-op 02/21/12 1444 02/22/12 1146        .  Tolerated the procedure well.  Placed with a foley intraoperatively.  Given Ofirmev at induction and for 48 hours.    POD #1, allowed out of bed to a chair.  PT for ambulation and exercise program.  Foley D/C'd in morning.  IV saline locked.  O2 discontionued. Hemovac pulled.  C/O hand weakness bilaterally but improved over the day  POD #2, continued PT and ambulation. PT felt she was able to be discharged today.  The remainder of the hospital course was dedicated to ambulation and strengthening.   The patient was discharged on 2 Days Post-Op in  Stable condition.  Blood products given:none  DIAGNOSTIC STUDIES: Recent vital signs: Patient Vitals for the past 24 hrs:   BP Temp Pulse Resp SpO2  02/24/12 0800 - - - 18  100 %  02/24/12 0513 157/84 mmHg 98.7 F (37.1 C) 78  18  99 %  03-17-12 2213 124/79 mmHg 98.4 F (36.9 C) 85  18  98 %  17-Mar-2012 1400 122/78 mmHg 98.2 F (36.8 C) 71  20  99 %       Recent laboratory studies:  Basename 02/24/12 0521 03/17/12 0558  WBC 7.0 4.5  HGB 10.0* 10.1*  HCT 29.8* 30.3*  PLT 271 245    Basename 02/24/12 0521 03-17-12 0558  NA 136 140  K 3.7 3.6  CL 104 107  CO2 23 24  BUN 7 8  CREATININE 0.67 0.70  GLUCOSE 100* 103*  CALCIUM 8.1* 7.9*   Lab Results  Component Value Date   INR 0.98 02/15/2012     Recent Radiographic Studies :   Chest 2 View  02/15/2012  *RADIOLOGY REPORT*  Clinical Data: Cough.  Preoperative for total knee arthroplasty.  CHEST - 2 VIEW  Comparison: None.  Findings: Cardiac and mediastinal contours appear normal.  The lungs appear clear.  No pleural effusion is identified.  IMPRESSION:  No significant abnormality identified.  Original Report Authenticated By: Dellia Cloud, M.D.    DISCHARGE INSTRUCTIONS: Discharge Orders    Future Orders Please Complete By Expires   Diet general      Call MD / Call 911      Comments:   If you experience chest pain or shortness of breath, CALL 911 and be transported to the hospital emergency room.  If you develope a fever above 101 F, pus (white drainage) or increased drainage or redness at the wound, or calf pain, call your surgeon's office.   Constipation Prevention      Comments:   Drink plenty of fluids.  Prune juice may be helpful.  You may use a stool softener, such as Colace (over the counter) 100 mg twice a day.  Use MiraLax (over the counter) for constipation as needed.   Increase activity slowly as tolerated      Patient may shower      Comments:   You may shower without a dressing once there is no drainage.  Do not wash over the wound.  If drainage remains, cover wound with plastic wrap and then shower.   Partial weight bearing       Comments:  50 % WEIGHT BEARING AS TAUGHT IN PHYSICAL THERAPY   Driving restrictions      Comments:   No driving for 6 weeks   Lifting restrictions      Comments:   No lifting for 6 weeks   CPM      Comments:   Continuous passive motion machine (CPM):      Use the CPM from 0 to 60 for 6-8 hours per day.      You may increase by 5-10 per day.  You may break it up into 2 or 3 sessions per day.      Use CPM for 3-4 weeks or until you are told to stop.   TED hose      Comments:   Use stockings (TED hose) for 3 weeks on operative leg(s).  You may remove them at night for sleeping.   Change dressing      Comments:   Change dressing on Sunday, then change the dressing daily with sterile 4 x 4 inch gauze dressing and apply TED hose.  You may clean the incision with alcohol prior to redressing.   Do not put a pillow under the knee. Place it under the heel.         DISCHARGE MEDICATIONS:   Medication List  As of 02/24/2012 10:35 AM   STOP taking these medications         HYDROcodone-acetaminophen 5-325 MG per tablet      penicillin v potassium 500 MG tablet         TAKE these medications         fluconazole 150 MG tablet   Commonly known as: DIFLUCAN   Take 150 mg by mouth daily.      methocarbamol 500 MG tablet   Commonly known as: ROBAXIN   Take 1 tablet (500 mg total) by mouth every 6 (six) hours as needed (spasms).      oxyCODONE 5 MG immediate release tablet   Commonly known as: Oxy IR/ROXICODONE   Take 1-2 tablets (5-10 mg total) by mouth every 4 (four) hours as needed for pain.      rivaroxaban 10 MG Tabs tablet   Commonly known as: XARELTO   Take 1 tablet (10 mg total) by mouth daily.      valACYclovir 1000 MG tablet   Commonly known as: VALTREX   Take 1,000 mg by mouth daily.            FOLLOW UP VISIT:   Follow-up Information    Follow up with [facility] on 03/08/2012.   Contact information:   [address] [place]  [address] [phone]          DISPOSITION:  Home  With home heath PT.  CONDITION:  Stable   Cheyenne Graham 02/24/2012, 10:35 AM

## 2012-02-24 NOTE — Progress Notes (Signed)
Patient ID: Cheyenne Graham, female   DOB: [birth year], 46 y.o.   MRN: [id number] PATIENT ID: Cheyenne Graham        MRN:  [id number]          DOB/AGE: [birth year] / 46 y.o.  Cheyenne Campbell, MD   Jacqualine Code, PA-C [address] [place], [place], [place]  [address]                             [phone]   PROGRESS NOTE  Subjective:  negative for Chest Pain  negative for Shortness of Breath  negative for Nausea/Vomiting   negative for Calf Pain  positive for Bowel Movement   Tolerating Diet: yes         Patient reports pain as mild.       Objective: Vital signs in last 24 hours:   Patient Vitals for the past 24 hrs:  BP Temp Pulse Resp SpO2  02/24/12 0800 - - - 18  100 %  02/24/12 0513 157/84 mmHg 98.7 F (37.1 C) 78  18  99 %  02/23/12 2213 124/79 mmHg 98.4 F (36.9 C) 85  18  98 %  02/23/12 1400 122/78 mmHg 98.2 F (36.8 C) 71  20  99 %      Intake/Output from previous day:   06/19 0701 - 06/20 0700 In: 720 [P.O.:720] Out: -    Intake/Output this shift:       Intake/Output      06/19 0701 - 06/20 0700 06/20 0701 - 06/21 0700   P.O. 720    I.V. (mL/kg)     Total Intake(mL/kg) 720 (7.7)    Urine (mL/kg/hr)     Drains     Blood     Total Output     Net +720         Urine Occurrence 601 x       LABORATORY DATA:  Basename 02/24/12 0521 02/23/12 0558  WBC 7.0 4.5  HGB 10.0* 10.1*  HCT 29.8* 30.3*  PLT 271 245    Basename 02/24/12 0521 02/23/12 0558  NA 136 140  K 3.7 3.6  CL 104 107  CO2 23 24  BUN 7 8  CREATININE 0.67 0.70  GLUCOSE 100* 103*  CALCIUM 8.1* 7.9*   Lab Results  Component Value Date   INR 0.98 02/15/2012    Examination:  General appearance: alert, cooperative and no distress  Wound Exam: clean, dry, intact   Drainage:  None: wound tissue dry  Motor Exam: EHL, FHL, Anterior Tibial and Posterior Tibial Intact  Sensory Exam: Superficial Peroneal, Deep Peroneal and Tibial normal  Vascular Exam: Normal  Assessment:    2 Days  Post-Op  Procedure(s) (LRB): TOTAL KNEE ARTHROPLASTY (Right)  ADDITIONAL DIAGNOSIS:  Principal Problem:  *Osteoarthritis of knee     Plan: Physical Therapy as ordered Partial Weight Bearing @ 50% (PWB)  DVT Prophylaxis:  Xarelto  DISCHARGE PLAN: Home  DISCHARGE NEEDS: HHPT, CPM, Walker and 3-in-1 comode seat    Will D/C today-excellent progress-wound clean, dry     Rylee Huestis W 02/24/2012, 10:08 AM

## 2012-02-24 NOTE — Progress Notes (Signed)
Physical Therapy Treatment Note   02/24/12 1012  PT Visit Information  Last PT Received On 02/24/12  Assistance Needed +1  PT Time Calculation  PT Start Time 1012  PT Stop Time 1039  PT Time Calculation (min) 27 min  Subjective Data  Subjective Pt received sitting EOB with request to use restroom.  Precautions  Precautions Knee  Restrictions  Weight Bearing Restrictions Yes  RLE Weight Bearing PWB  RLE Partial Weight Bearing Percentage or Pounds 50  Cognition  Overall Cognitive Status Appears within functional limits for tasks assessed/performed  Arousal/Alertness Awake/alert  Orientation Level Appears intact for tasks assessed  Behavior During Session [facility] for tasks performed  Transfers  Transfers Sit to Stand;Stand to Sit  Sit to Stand 4: Min assist;With upper extremity assist;From chair/3-in-1;With armrests  Stand to Sit 4: Min guard;With armrests;To chair/3-in-1;With upper extremity assist  Ambulation/Gait  Ambulation/Gait Assistance 5: Supervision  Ambulation Distance (Feet) 100 Feet  Assistive device Rolling walker  Ambulation/Gait Assistance Details pt 100% compliant  Gait Pattern Step-to pattern;Decreased step length - right;Decreased stance time - right;Antalgic  Gait velocity slow  Stairs Yes  Stairs Assistance 4: Min guard  Stair Management Technique No rails;Backwards;With walker  Number of Stairs 1  (platform step)  Wheelchair Mobility  Wheelchair Mobility No  Total Joint Exercises  Heel Slides AROM;Right;10 reps;Seated  PT - End of Session  Equipment Utilized During Treatment Gait belt  Activity Tolerance Patient tolerated treatment well  Patient left in chair;with call bell/phone within reach;with family/visitor present  Nurse Communication Mobility status  PT - Assessment/Plan  Comments on Treatment Session Pt reports increased pain in R knee limiting ability to tolerate > 30 deg flexion active/passive. Extensive education on importance of knee  flexion/extension. Went over [employer]. Patient safe with stair negotiation.  PT Plan Discharge plan remains appropriate;Frequency remains appropriate  PT Frequency 7X/week  Follow Up Recommendations Home health PT;Supervision/Assistance - 24 hour  Equipment Recommended Rolling walker with 5" wheels;3 in 1 bedside comode  Acute Rehab PT Goals  Time For Goal Achievement 03/01/12  Potential to Achieve Goals Good  PT Goal: Sit to Stand - Progress Progressing toward goal  PT Goal: Ambulate - Progress Progressing toward goal  PT Goal: Up/Down Stairs - Progress Progressing toward goal  PT Goal: Perform Home Exercise Program - Progress Met  PT General Charges  $$ ACUTE PT VISIT 1 Procedure  PT Treatments  $Gait Training 8-22 mins  $Therapeutic Exercise 8-22 mins     Pain: 7/10 R knee pain  Lewis Shock, PT, DPT Pager #: [phone] Office #: [phone]

## 2012-02-24 NOTE — Care Management Note (Signed)
    Page 1 of 1   02/24/2012     12:24:37 PM   CARE MANAGEMENT NOTE 02/24/2012  Patient:  Cheyenne Graham, Cheyenne Graham   Account Number:  [id number]  Date Initiated:  02/24/2012  Documentation initiated by:  [facility]  Subjective/Objective Assessment:   Admitted postop total rt knee arthroplasty.     Action/Plan:   Anticipated DC Date:  02/24/2012   Anticipated DC Plan:  HOME W HOME HEALTH SERVICES      DC Planning Services  CM consult      Choice offered to / List presented to:  C-1 Patient        HH arranged  HH-2 PT      [facility] agency  [facility]   Status of service:  Completed, signed off Medicare Important Message given?   (If response is "NO", the following Medicare IM given date fields will be blank) Date Medicare IM given:   Date Additional Medicare IM given:    Discharge Disposition:  HOME W HOME HEALTH SERVICES  Per UR Regulation:  Reviewed for med. necessity/level of care/duration of stay  If discussed at Long Length of Stay Meetings, dates discussed:    Comments:  02/24/12 Spoke with patient about HHC for HHPT order. She chose [facility] from [facility] list of agencies. Patient states that she already has a walker and 3 i in 1 at home from the MD office. Contacted Debbie at [facility] and requested HHP with anticipated discharge of today. Jacquelynn Cree RN, BSN, CCM

## 2013-01-17 ENCOUNTER — Other Ambulatory Visit (HOSPITAL_COMMUNITY): Payer: Self-pay | Admitting: Orthopaedic Surgery

## 2013-01-17 DIAGNOSIS — M25561 Pain in right knee: Secondary | ICD-10-CM

## 2013-01-23 ENCOUNTER — Encounter (HOSPITAL_COMMUNITY)
Admission: RE | Admit: 2013-01-23 | Discharge: 2013-01-23 | Disposition: A | Discharge status: Home / Self Care | Payer: Federal, State, Local not specified - PPO | Source: Ambulatory Visit | Attending: Orthopaedic Surgery | Admitting: Orthopaedic Surgery

## 2013-01-23 DIAGNOSIS — M1710 Unilateral primary osteoarthritis, unspecified knee: Secondary | ICD-10-CM | POA: Insufficient documentation

## 2013-01-23 DIAGNOSIS — Z96659 Presence of unspecified artificial knee joint: Secondary | ICD-10-CM | POA: Insufficient documentation

## 2013-01-23 DIAGNOSIS — M25561 Pain in right knee: Secondary | ICD-10-CM

## 2013-01-23 MED ORDER — TECHNETIUM TC 99M MEDRONATE IV KIT
26.0000 | PACK | Freq: Once | INTRAVENOUS | Status: AC | PRN
  Administered 2013-01-23: 26 via INTRAVENOUS

## 2013-05-13 ENCOUNTER — Other Ambulatory Visit: Payer: Self-pay | Admitting: Adult Health

## 2013-06-20 ENCOUNTER — Encounter (HOSPITAL_COMMUNITY): Payer: Self-pay | Admitting: Emergency Medicine

## 2013-06-20 ENCOUNTER — Emergency Department (HOSPITAL_COMMUNITY): Payer: Federal, State, Local not specified - PPO

## 2013-06-20 ENCOUNTER — Emergency Department (HOSPITAL_COMMUNITY)
Admission: EM | Admit: 2013-06-20 | Discharge: 2013-06-20 | Disposition: A | Discharge status: Home / Self Care | Payer: Federal, State, Local not specified - PPO | Attending: Emergency Medicine | Admitting: Emergency Medicine

## 2013-06-20 ENCOUNTER — Emergency Department (INDEPENDENT_AMBULATORY_CARE_PROVIDER_SITE_OTHER): Payer: Federal, State, Local not specified - PPO

## 2013-06-20 ENCOUNTER — Emergency Department (HOSPITAL_COMMUNITY)
Admission: EM | Admit: 2013-06-20 | Discharge: 2013-06-20 | Disposition: A | Discharge status: Other Acute Inpatient Hospital | Payer: Federal, State, Local not specified - PPO | Source: Home / Self Care | Attending: Emergency Medicine | Admitting: Emergency Medicine

## 2013-06-20 DIAGNOSIS — Z8679 Personal history of other diseases of the circulatory system: Secondary | ICD-10-CM | POA: Insufficient documentation

## 2013-06-20 DIAGNOSIS — M25519 Pain in unspecified shoulder: Secondary | ICD-10-CM | POA: Insufficient documentation

## 2013-06-20 DIAGNOSIS — R0602 Shortness of breath: Secondary | ICD-10-CM | POA: Insufficient documentation

## 2013-06-20 DIAGNOSIS — Z3202 Encounter for pregnancy test, result negative: Secondary | ICD-10-CM | POA: Insufficient documentation

## 2013-06-20 DIAGNOSIS — R071 Chest pain on breathing: Secondary | ICD-10-CM

## 2013-06-20 DIAGNOSIS — R0781 Pleurodynia: Secondary | ICD-10-CM

## 2013-06-20 DIAGNOSIS — Z8719 Personal history of other diseases of the digestive system: Secondary | ICD-10-CM | POA: Insufficient documentation

## 2013-06-20 DIAGNOSIS — Z8701 Personal history of pneumonia (recurrent): Secondary | ICD-10-CM | POA: Insufficient documentation

## 2013-06-20 DIAGNOSIS — Z7901 Long term (current) use of anticoagulants: Secondary | ICD-10-CM | POA: Insufficient documentation

## 2013-06-20 DIAGNOSIS — Z79899 Other long term (current) drug therapy: Secondary | ICD-10-CM | POA: Insufficient documentation

## 2013-06-20 DIAGNOSIS — M129 Arthropathy, unspecified: Secondary | ICD-10-CM | POA: Insufficient documentation

## 2013-06-20 DIAGNOSIS — R0789 Other chest pain: Secondary | ICD-10-CM

## 2013-06-20 DIAGNOSIS — M25511 Pain in right shoulder: Secondary | ICD-10-CM

## 2013-06-20 LAB — BASIC METABOLIC PANEL
BUN: 18 mg/dL (ref 6–23)
GFR calc Af Amer: >90 mL/min (ref >90)
GFR calc non Af Amer: >90 mL/min (ref >90)
Potassium: 3.8 mEq/L (ref 3.5–5.1)
Sodium: 138 mEq/L (ref 135–145)

## 2013-06-20 LAB — CBC WITH DIFFERENTIAL/PLATELET
Basophils Absolute: 0 10*3/uL (ref 0.0–0.1)
Basophils Relative: 0 % (ref 0–1)
Eosinophils Absolute: 0.1 10*3/uL (ref 0.0–0.7)
Hemoglobin: 12.2 g/dL (ref 12.0–15.0)
MCH: 28.9 pg (ref 26.0–34.0)
MCHC: 30.3 g/dL (ref 30.0–36.0)
Monocytes Relative: 7 % (ref 3–12)
Neutrophils Relative %: 40 % — ABNORMAL LOW (ref 43–77)
Platelets: 308 10*3/uL (ref 150–400)
RDW: 14 % (ref 11.5–15.5)

## 2013-06-20 LAB — POCT PREGNANCY, URINE: Preg Test, Ur: NEGATIVE

## 2013-06-20 MED ORDER — HYDROCODONE-ACETAMINOPHEN 5-325 MG PO TABS
ORAL_TABLET | ORAL | Status: AC
  Filled 2013-06-20: qty 1

## 2013-06-20 MED ORDER — HYDROCODONE-ACETAMINOPHEN 5-500 MG PO TABS: 1.0000 | ORAL_TABLET | Freq: Four times a day (QID) | ORAL | Status: DC | PRN

## 2013-06-20 MED ORDER — SODIUM CHLORIDE 0.9 % IV BOLUS (SEPSIS)
1000.0000 mL | Freq: Once | INTRAVENOUS | Status: AC
  Administered 2013-06-20: 1000 mL via INTRAVENOUS

## 2013-06-20 MED ORDER — HYDROCODONE-ACETAMINOPHEN 5-325 MG PO TABS
2.0000 | ORAL_TABLET | Freq: Once | ORAL | Status: AC
  Administered 2013-06-20: 2 via ORAL

## 2013-06-20 MED ORDER — ONDANSETRON HCL 4 MG/2ML IJ SOLN
4.0000 mg | Freq: Once | INTRAMUSCULAR | Status: AC
  Administered 2013-06-20: 4 mg via INTRAVENOUS
  Filled 2013-06-20: qty 2

## 2013-06-20 MED ORDER — IOHEXOL 350 MG/ML SOLN
100.0000 mL | Freq: Once | INTRAVENOUS | Status: AC | PRN
  Administered 2013-06-20: 100 mL via INTRAVENOUS

## 2013-06-20 MED ORDER — MORPHINE SULFATE 4 MG/ML IJ SOLN
4.0000 mg | Freq: Once | INTRAMUSCULAR | Status: AC
  Administered 2013-06-20: 4 mg via INTRAVENOUS
  Filled 2013-06-20: qty 1

## 2013-06-20 NOTE — ED Notes (Signed)
Patient transported to CT 

## 2013-06-20 NOTE — ED Notes (Signed)
The pt came from [facility] with a dx of a clot in her lungs.  She had  Pain in her legs for 3 weeks and she had some lt shoulder pain.  Rt sided chest pain for one week.  She also has some rt posterior chest.  She had  An ekg there and blood work there that was pos

## 2013-06-20 NOTE — ED Provider Notes (Signed)
Chief Complaint:   Chief Complaint  Patient presents with  . Shoulder Pain    History of Present Illness:   Cheyenne Graham is a 47 year old female who presents with a one-week history of right-sided chest pain. The patient states that 3 weeks ago she had pain in the right shoulder, right trapezius ridge, and this would be worse if she would move her abductor shoulder. She also had some headache associated with it but no neck pain. There is no swelling of the arm, numbness, tingling, or weakness. About one week ago the pain progressed from the shoulder to the entire right hemithorax. It radiates to the back. It was constant and rated as high as 10 over 10 at times will allow as an 8/10. The pain is worse with deep inspiration, but also when she's under stress. It's been associated with shortness of breath, nausea, sweats, cough, and feeling lightheaded. She denies any hemoptysis. She's had no leg pain or swelling, no history of DVT or pulmonary embolism.  Review of Systems:  Other than noted above, the patient denies any of the following symptoms. Systemic:  No fever, chills, sweats, or fatigue. ENT:  No nasal congestion, rhinorrhea, or sore throat. Pulmonary:  No cough, wheezing, shortness of breath, sputum production, hemoptysis. Cardiac:  No palpitations, rapid heartbeat, dizziness, presyncope or syncope. GI:  No abdominal pain, heartburn, nausea, or vomiting. She has had some reflux esophagitis but does not take any medication for this right now. Ext:  No leg pain or swelling. She's had some pain in her lower back.  PMFSH:  Past medical history, family history, social history, meds, and allergies were reviewed and updated as needed. She has no medication allergies. She takes tramadol and ibuprofen. She has gastroesophageal reflux and has had a history of cartilage injuries in her knees and has had several orthopedic procedures.  Physical Exam:   Vital signs:  BP 151/82  Pulse 91  Temp(Src)  98.1 F (36.7 C) (Oral)  Resp 16  SpO2 100%  LMP 06/13/2013 Gen:  Alert, oriented, in no distress, skin warm and dry. Eye:  PERRL, lids and conjunctivas normal.  Sclera non-icteric. ENT:  Mucous membranes moist, pharynx clear. Neck:  Supple, no adenopathy there is tenderness to palpation in the right trapezius area and pain with movement.  No JVD. Lungs:  Clear to auscultation, no wheezes, rales or rhonchi.  No respiratory distress. Heart:  Regular rhythm.  No gallops, murmers, clicks or rubs. Chest:  She has chest wall tenderness to palpation in the right pectoral area. There is also pain to palpation in the right upper back Abdomen:  Soft, nontender, no organomegaly or mass.  Bowel sounds normal.  No pulsatile abdominal mass or bruit. Ext:  No edema.  No calf tenderness and Homann's sign negative.  Pulses full and equal. There is pain to palpation of the right shoulder and upper arm. Shoulder pain is worse with abduction. Skin:  Warm and dry.  No rash.  Labs:   Results for orders placed during the hospital encounter of 06/20/13  D-DIMER, QUANTITATIVE      Result Value Range   D-Dimer, Quant 0.89 (*) 0.00 - 0.48 ug/mL-FEU     Radiology:  Dg Chest 2 View  06/20/2013   CLINICAL DATA:  Chest pain.  EXAM: CHEST  2 VIEW  COMPARISON:  February 15, 2012.  FINDINGS: The heart size and mediastinal contours are within normal limits. Both lungs are clear. The visualized skeletal structures are unremarkable.  IMPRESSION: No active cardiopulmonary disease.   Electronically Signed   By: Roque Lias M.D.   On: 06/20/2013 16:15   I reviewed the images independently and personally and concur with the radiologist's findings.  EKG:   Date: 06/20/2013  Rate: 73  Rhythm: normal sinus rhythm  QRS Axis: normal  Intervals: normal  ST/T Wave abnormalities: normal  Conduction Disutrbances:none  Narrative Interpretation: Normal sinus rhythm, normal EKG.  Old EKG Reviewed: none available  Course in Urgent  Care Center:   Was given Norco 5/325 2 for pain with some relief  Assessment:  The encounter diagnosis was Pleuritic chest pain.  Rule out pulmonary embolism. Differential diagnosis also includes musculoskeletal chest pain and rotator cuff tendinitis.  Plan:   The patient was transferred to the ED via shuttle in stable condition.  Medical Decision Making:  47 year old female has a 1 week history of pleuritic right chest pain, radiating to back, associated with shortness of breath.  No leg pain or swelling.  CXR and EKG were normal, but d-Dimer was 0.89 suggesting pulmonary embolism.  Needs chest CT angiogram.       Reuben Likes, MD 06/20/13 1718

## 2013-06-20 NOTE — ED Notes (Signed)
C/o right shoulder pain radiating to right breast and right side of back which started last week.  Ibuprofen taken but no relief.

## 2013-06-20 NOTE — ED Provider Notes (Signed)
CSN: [id number]     Arrival date & time 06/20/13  1714 History   None    Chief Complaint  Patient presents with  . Chest Pain   (Consider location/radiation/quality/duration/timing/severity/associated sxs/prior Treatment) Patient is a 47 y.o. female presenting with chest pain. The history is provided by the patient.  Chest Pain Pain location:  R chest Pain quality: aching and stabbing   Pain radiates to:  R shoulder Pain radiates to the back: yes   Pain severity:  Moderate Onset quality:  Gradual Timing:  Intermittent Progression:  Waxing and waning Chronicity:  New Context: breathing, movement and raising an arm   Relieved by:  None tried Worsened by:  Deep breathing, movement and certain positions Associated symptoms: shortness of breath   Associated symptoms: no abdominal pain, no altered mental status, no anxiety, no back pain, no cough, no fatigue, no fever, no headache, no nausea, no numbness, no orthopnea, no palpitations, not vomiting and no weakness   Risk factors: no aortic disease, no coronary artery disease, no diabetes mellitus, no hypertension, not obese, not pregnant, no prior DVT/PE and no smoking     Past Medical History  Diagnosis Date  . Arthritis   . Pneumonia        . GERD (gastroesophageal reflux disease)     decreased since she is not taking advail  . Constipation   . Migraine      2 a week  . Migraine    Past Surgical History  Procedure Laterality Date  . Knee arthroscopy    . Tubal ligation    . Knee surgery  02/22/2012  . Total knee arthroplasty  02/22/2012    Procedure: TOTAL KNEE ARTHROPLASTY;  Surgeon: Valeria Batman, MD;  Location: [facility] OR;  Service: Orthopedics;  Laterality: Right;   Family History  Problem Relation Age of Onset  . Hypertension Mother   . Cancer Mother   . Hypertension Father   . Asthma Sister   . Migraines Sister    History  Substance Use Topics  . Smoking status: Never Smoker   . Smokeless tobacco: Never Used   . Alcohol Use: 0.0 oz/week    0 Glasses of wine per week     Comment: occasional   OB History   Grav Para Term Preterm Abortions TAB SAB Ect Mult Living                 Review of Systems  Constitutional: Negative for fever and fatigue.  Respiratory: Positive for shortness of breath. Negative for cough and chest tightness.   Cardiovascular: Positive for chest pain. Negative for palpitations and orthopnea.  Gastrointestinal: Negative for nausea, vomiting, abdominal pain, diarrhea and constipation.  Musculoskeletal: Negative for back pain.  Neurological: Negative for weakness, numbness and headaches.  All other systems reviewed and are negative.    Allergies  Shrimp  Home Medications   Current Outpatient Rx  Name  Route  Sig  Dispense  Refill  . ibuprofen (ADVIL,MOTRIN) 800 MG tablet   Oral   Take 800 mg by mouth every 8 (eight) hours as needed for pain.         . Multiple Vitamins-Minerals (MULTIVITAMIN WITH MINERALS) tablet   Oral   Take 1 tablet by mouth daily.         . rivaroxaban (XARELTO) 10 MG TABS tablet   Oral   Take 1 tablet (10 mg total) by mouth daily.   14 tablet   0   . traMADol (  ULTRAM) 50 MG tablet   Oral   Take 50 mg by mouth every 6 (six) hours as needed for pain.         . valACYclovir (VALTREX) 1000 MG tablet   Oral   Take 1,000 mg by mouth daily.         Marland Kitchen HYDROcodone-acetaminophen (VICODIN) 5-500 MG per tablet   Oral   Take 1 tablet by mouth every 6 (six) hours as needed for pain.   12 tablet   0    BP 124/71  Pulse 65  Temp(Src) 98.5 F (36.9 C)  Resp 15  SpO2 99%  LMP 06/13/2013 Physical Exam  Nursing note and vitals reviewed. Constitutional: She is oriented to person, place, and time. She appears well-developed and well-nourished. No distress.  HENT:  Head: Normocephalic and atraumatic.  Mouth/Throat: Oropharynx is clear and moist. No oropharyngeal exudate.  Eyes: Conjunctivae and EOM are normal. Pupils are equal,  round, and reactive to light.  Neck: Normal range of motion. Neck supple.  Cardiovascular: Normal rate, regular rhythm and normal heart sounds.  Exam reveals no gallop and no friction rub.   No murmur heard. Pulmonary/Chest: Effort normal and breath sounds normal. No respiratory distress. She has no wheezes. She has no rales. She exhibits no tenderness.  Tenderness to palpation of the right chest, right shoulder, right scapula. Reproduced with palpation as well as elevation rotation right shoulder  Abdominal: Soft. She exhibits no distension. There is no tenderness.  Musculoskeletal: Normal range of motion. She exhibits no edema and no tenderness.  No lower extremity edema. No tenderness.  Lymphadenopathy:    She has no cervical adenopathy.  Neurological: She is alert and oriented to person, place, and time.  Skin: Skin is warm and dry. No rash noted. She is not diaphoretic.  Psychiatric: She has a normal mood and affect. Her behavior is normal. Judgment and thought content normal.    ED Course  Procedures (including critical care time) Labs Review Labs Reviewed  CBC WITH DIFFERENTIAL - Abnormal; Notable for the following:    Neutrophils Relative % 40 (*)    Lymphocytes Relative 52 (*)    All other components within normal limits  BASIC METABOLIC PANEL  POCT PREGNANCY, URINE   Imaging Review Dg Chest 2 View  06/20/2013   CLINICAL DATA:  Chest pain.  EXAM: CHEST  2 VIEW  COMPARISON:  February 15, 2012.  FINDINGS: The heart size and mediastinal contours are within normal limits. Both lungs are clear. The visualized skeletal structures are unremarkable.  IMPRESSION: No active cardiopulmonary disease.   Electronically Signed   By: Roque Lias M.D.   On: 06/20/2013 16:15   Ct Angio Chest W/cm &/or Wo Cm  06/20/2013   CLINICAL DATA:  Chest pain  EXAM: CT ANGIOGRAPHY CHEST WITH CONTRAST  TECHNIQUE: Multidetector CT imaging of the chest was performed using the standard protocol during bolus  administration of intravenous contrast. Multiplanar CT image reconstructions including MIPs were obtained to evaluate the vascular anatomy.  CONTRAST:  OMNIPAQUE IOHEXOL 350 MG/ML SOLN  COMPARISON:  Chest radiograph June 20, 2013  FINDINGS: No demonstrable pulmonary there is no thoracic aortic aneurysm or dissection.  There is patchy atelectasis in both lower lobes, slightly more on the left than on the right. Elsewhere, lungs are clear.  There is no appreciable thoracic adenopathy. Pericardium is not thickened.  There is a small hiatal hernia.  Visualized upper abdominal structures appear normal. There is a small bone Michaelfurt  in the T5 vertebral body. There are no lytic or destructive bone lesions. Thyroid appears normal.  Review of the MIP images confirms the above findings.  IMPRESSION: Bibasilar atelectasis, more on the left than on the right. No consolidation. No demonstrable pulmonary embolus.   Electronically Signed   By: Bretta Bang M.D.   On: 06/20/2013 21:35    EKG Interpretation   None       MDM   1. Right shoulder pain   2. Chest wall pain     47 year old female who presents with 3 weeks of right shoulder pain as well as one week of right chest and shortness of breath. Patient states that the pain is getting progressively worse and has been not improved with her nonsteroidal treatments. Pain is worse with inspiration as well as movement. She is also associated shortness of breath with this chest pain. She denies worsening swelling. She also does not have a history of pulmonary embolus. Patient was seen in urgent care earlier today and attempt to rule out pulmonary embolus and by d-dimer, however the d-dimer was positive. She was sent over to department for further evaluation and in pulmonary embolus rule out. EKG obtained at urgent care shows normal sinus rhythm without signs of ischemia.  Patient is not hypoxic on room air. Not tachycardic and afebrile. Well-appearing.  Pain to palpation of the right chest as well as right shoulder, also with range of motion. Most likely musculoskeletal, however based on symptoms as well as positive dimer, patient will need to get evaluated with angiography. Patient is low risk and atypical, doubt ACS. Chest x-ray reviewed and shows no signs of dissection along with atypical history. Basic labs sent and renal function appropriate. Angiography ordered.  CTA without signs of pulmonary embolus. Laboratory workup remains unremarkable. Based on these findings as well as response to treatment, so the musculoskeletal pain, possible rotator cuff tendinitis most likely. Patient will followup with sports medicine clinic should symptoms persist. Short course of Norco given after evaluating patient in the controlled substance database and found that she has no prescription filled in the last 6 months. Patient to followup with PCP.  This patient was discussed with my attending, Dr. Fayrene Fearing.  Dorna Leitz, MD 06/21/13 [phone]

## 2013-06-21 NOTE — ED Provider Notes (Signed)
Patient seen and examined. CTA shows no signs of pulmonary embolus. Her exam shows muscle skeletal and shoulder pain that is reproducible. Historically she's had pain for several weeks. She is appropriate for outpatient followup with anti-inflammatory treatment.  I saw and evaluated the patient, reviewed the resident's note and I agree with the findings and plan.   Roney Marion, MD 06/21/13 1356

## 2013-06-21 NOTE — ED Provider Notes (Signed)
Patient seen and evaluated. She is reproducible symptoms with outpatient chest manipulation the shoulder. Her complaints involve her right chest wall right shoulder for several weeks. Her CT angiogram is normal. He is nontender in the upper abdomen. She does ask about "my gallbladder". She's not had food intolerances or specific abdominal complaints her referred her primary care physician for any concerns regarding her abdomen which was not discussed initially upon arrival. Regarding her chest and shoulder pain and this is musculoskeletal and needs no additional evaluation in the emergency room tonight  Roney Marion, MD 06/21/13 0120

## 2013-09-13 ENCOUNTER — Other Ambulatory Visit (HOSPITAL_COMMUNITY): Payer: Self-pay | Admitting: Rheumatology

## 2013-09-13 DIAGNOSIS — M25561 Pain in right knee: Secondary | ICD-10-CM

## 2013-09-13 DIAGNOSIS — M25562 Pain in left knee: Principal | ICD-10-CM

## 2013-09-28 ENCOUNTER — Encounter (HOSPITAL_COMMUNITY): Payer: Self-pay

## 2013-09-28 ENCOUNTER — Encounter (HOSPITAL_COMMUNITY)
Admission: RE | Admit: 2013-09-28 | Discharge: 2013-09-28 | Disposition: A | Discharge status: Home / Self Care | Payer: Federal, State, Local not specified - PPO | Source: Ambulatory Visit | Attending: Rheumatology | Admitting: Rheumatology

## 2013-09-28 ENCOUNTER — Encounter (HOSPITAL_COMMUNITY): Payer: Federal, State, Local not specified - PPO

## 2013-09-28 DIAGNOSIS — M25569 Pain in unspecified knee: Secondary | ICD-10-CM | POA: Insufficient documentation

## 2013-09-28 DIAGNOSIS — M25562 Pain in left knee: Secondary | ICD-10-CM

## 2013-09-28 DIAGNOSIS — M25561 Pain in right knee: Secondary | ICD-10-CM

## 2013-09-28 MED ORDER — TECHNETIUM TC 99M MEDRONATE IV KIT
26.7000 | PACK | Freq: Once | INTRAVENOUS | Status: AC | PRN
  Administered 2013-09-28: 26.7 via INTRAVENOUS

## 2013-10-25 ENCOUNTER — Other Ambulatory Visit: Payer: Self-pay | Admitting: Gastroenterology

## 2013-10-25 DIAGNOSIS — R110 Nausea: Secondary | ICD-10-CM

## 2013-10-25 DIAGNOSIS — R1033 Periumbilical pain: Secondary | ICD-10-CM

## 2013-11-07 ENCOUNTER — Ambulatory Visit (HOSPITAL_COMMUNITY)
Admission: RE | Admit: 2013-11-07 | Discharge: 2013-11-07 | Disposition: A | Discharge status: Home / Self Care | Payer: Federal, State, Local not specified - PPO | Source: Ambulatory Visit | Attending: Gastroenterology | Admitting: Gastroenterology

## 2013-11-07 DIAGNOSIS — R1033 Periumbilical pain: Secondary | ICD-10-CM

## 2013-11-07 DIAGNOSIS — R110 Nausea: Secondary | ICD-10-CM | POA: Insufficient documentation

## 2013-11-07 DIAGNOSIS — R109 Unspecified abdominal pain: Secondary | ICD-10-CM | POA: Insufficient documentation

## 2013-11-07 MED ORDER — SINCALIDE 5 MCG IJ SOLR
INTRAMUSCULAR | Status: AC
  Filled 2013-11-07: qty 5

## 2013-11-07 MED ORDER — SINCALIDE 5 MCG IJ SOLR
0.0400 ug/kg | Freq: Once | INTRAMUSCULAR | Status: AC
Start: 2013-11-07 — End: 2013-11-07
  Administered 2013-11-07: 5 ug via INTRAVENOUS

## 2013-11-07 MED ORDER — TECHNETIUM TC 99M MEBROFENIN IV KIT
5.0000 | PACK | Freq: Once | INTRAVENOUS | Status: AC | PRN
  Administered 2013-11-07: 5 via INTRAVENOUS

## 2013-11-07 MED ORDER — STERILE WATER FOR INJECTION IJ SOLN
INTRAMUSCULAR | Status: AC
  Filled 2013-11-07: qty 10

## 2013-12-12 ENCOUNTER — Encounter: Payer: Self-pay | Admitting: Physical Medicine & Rehabilitation

## 2014-01-25 ENCOUNTER — Encounter: Payer: Self-pay | Admitting: Physical Medicine & Rehabilitation

## 2014-01-25 ENCOUNTER — Encounter: Payer: Federal, State, Local not specified - PPO | Attending: Physical Medicine & Rehabilitation

## 2014-01-25 ENCOUNTER — Ambulatory Visit (HOSPITAL_BASED_OUTPATIENT_CLINIC_OR_DEPARTMENT_OTHER): Payer: Federal, State, Local not specified - PPO | Admitting: Physical Medicine & Rehabilitation

## 2014-01-25 VITALS — BP 139/74 | HR 86 | Resp 14 | Ht 64.0 in | Wt 205.0 lb

## 2014-01-25 DIAGNOSIS — G8928 Other chronic postprocedural pain: Secondary | ICD-10-CM | POA: Insufficient documentation

## 2014-01-25 DIAGNOSIS — Z5181 Encounter for therapeutic drug level monitoring: Secondary | ICD-10-CM

## 2014-01-25 DIAGNOSIS — M25519 Pain in unspecified shoulder: Secondary | ICD-10-CM | POA: Insufficient documentation

## 2014-01-25 DIAGNOSIS — M1710 Unilateral primary osteoarthritis, unspecified knee: Secondary | ICD-10-CM

## 2014-01-25 DIAGNOSIS — M542 Cervicalgia: Secondary | ICD-10-CM | POA: Insufficient documentation

## 2014-01-25 DIAGNOSIS — Z96659 Presence of unspecified artificial knee joint: Secondary | ICD-10-CM | POA: Insufficient documentation

## 2014-01-25 DIAGNOSIS — IMO0002 Reserved for concepts with insufficient information to code with codable children: Secondary | ICD-10-CM

## 2014-01-25 DIAGNOSIS — M25569 Pain in unspecified knee: Secondary | ICD-10-CM | POA: Insufficient documentation

## 2014-01-25 DIAGNOSIS — Z79899 Other long term (current) drug therapy: Secondary | ICD-10-CM

## 2014-01-25 DIAGNOSIS — M1712 Unilateral primary osteoarthritis, left knee: Secondary | ICD-10-CM

## 2014-01-25 MED ORDER — LIDOCAINE HCL 4 % EX SOLN: Freq: Three times a day (TID) | CUTANEOUS | Status: DC | PRN
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
## 2014-01-25 NOTE — Patient Instructions (Signed)
If urine drug screen looks okay will call in Tylenol #3, 1-2 tablets twice a day as needed  Nerve blocks under x-ray guidance next visit  Try lidocaine ointment

## 2014-02-01 ENCOUNTER — Telehealth: Payer: Self-pay

## 2014-02-01 NOTE — Telephone Encounter (Signed)
Patient called to get tylenol #3.  Advised her we were still waiting on UDS.  Shw also wanted to make [place] aware that the lidocaine cream is not helping at all.

## 2014-02-01 NOTE — Telephone Encounter (Signed)
Left message for patient to call office regarding her previous phone call.

## 2014-02-01 NOTE — Telephone Encounter (Signed)
Patient called regarding her tylenol #3 rx.  Phone call was very muffled and hard to understand.

## 2014-02-04 MED ORDER — ACETAMINOPHEN-CODEINE #3 300-30 MG PO TABS: 1.0000 | ORAL_TABLET | Freq: Two times a day (BID) | ORAL | Status: DC

## 2014-02-04 NOTE — Telephone Encounter (Signed)
Tylenol #3 #120, 1-2 tablets twice a day with one refill

## 2014-02-04 NOTE — Telephone Encounter (Signed)
Phoned in Tylenol #3 #120 with 1 add'l refill. Attempted to contact patient. Left a voicemail informing patient that she has a RX at the pharmacy.

## 2014-02-04 NOTE — Telephone Encounter (Signed)
Patient urine drug screen from 01/25/2014 was consistent.  Please advise on medication.

## 2014-02-12 ENCOUNTER — Telehealth: Payer: Self-pay

## 2014-02-12 MED ORDER — ACETAMINOPHEN-CODEINE #4 300-60 MG PO TABS: 2.0000 | ORAL_TABLET | Freq: Two times a day (BID) | ORAL | Status: DC

## 2014-02-12 NOTE — Telephone Encounter (Signed)
Ordered Tylenol #4 per Dr. Letta Pate called in at pharmacy. Patient is aware.

## 2014-02-12 NOTE — Telephone Encounter (Signed)
Patient states Tylenol #3 is not helping with her pain. Patient states she has been taking 2 tablets BID. Please advise.

## 2014-02-12 NOTE — Telephone Encounter (Signed)
Call in T#4 2 tabs po BID #120 no refills

## 2014-02-13 ENCOUNTER — Telehealth: Payer: Self-pay | Admitting: *Deleted

## 2014-02-13 NOTE — Telephone Encounter (Signed)
[facility] pharmacy called to verify the Tyl #4 order called in yesterday since Cheyenne Graham just got the tylenol # 3 a few days ago.  I verified that we did know that.  She will request that the pt bring in the tyl # 3 to them to be destroyed before they will dispense the tylenol #4.

## 2014-03-05 ENCOUNTER — Ambulatory Visit (HOSPITAL_BASED_OUTPATIENT_CLINIC_OR_DEPARTMENT_OTHER): Payer: Federal, State, Local not specified - PPO | Admitting: Physical Medicine & Rehabilitation

## 2014-03-05 ENCOUNTER — Encounter: Payer: Federal, State, Local not specified - PPO | Attending: Physical Medicine & Rehabilitation

## 2014-03-05 ENCOUNTER — Encounter: Payer: Self-pay | Admitting: Physical Medicine & Rehabilitation

## 2014-03-05 VITALS — BP 134/83 | HR 93 | Resp 14 | Wt 199.0 lb

## 2014-03-05 DIAGNOSIS — M1710 Unilateral primary osteoarthritis, unspecified knee: Secondary | ICD-10-CM | POA: Insufficient documentation

## 2014-03-05 DIAGNOSIS — M25519 Pain in unspecified shoulder: Secondary | ICD-10-CM | POA: Insufficient documentation

## 2014-03-05 DIAGNOSIS — Z96659 Presence of unspecified artificial knee joint: Secondary | ICD-10-CM | POA: Insufficient documentation

## 2014-03-05 DIAGNOSIS — G8928 Other chronic postprocedural pain: Secondary | ICD-10-CM | POA: Insufficient documentation

## 2014-03-05 DIAGNOSIS — M542 Cervicalgia: Secondary | ICD-10-CM | POA: Insufficient documentation

## 2014-03-05 DIAGNOSIS — M25569 Pain in unspecified knee: Secondary | ICD-10-CM | POA: Insufficient documentation

## 2014-03-05 NOTE — Progress Notes (Signed)
  [facility]   Name: DIAMONIQUE RUEDAS DOB:23-Apr-1966 MRN: [id number]  Date:03/05/2014  Physician: Alysia Penna, MD    Nurse/CMA: Shumaker RN  Allergies:  Allergies  Allergen Reactions  . Shrimp [Shellfish Allergy] Nausea And Vomiting    Consent Signed: Yes.    Is patient diabetic? No.  CBG today?   Pregnant: No. LMP:12/03/2013 (approx) perimenopausal (age 49-55)  Anticoagulants: no Anti-inflammatory: no Antibiotics: yes (valtrex)  Procedure: Right genicular nerve block Position: Supine Start Time: 12:27 End Time: 12:38 Fluoro Time: 24 seconds  RN/CMA Health and safety inspector RN    Time 12:04 12:43    BP 134/83 139/86    Pulse 93 79    Respirations 14 14    O2 Sat 98 97    S/S 6 6    Pain Level 6-7/10 4/10     D/C home with son, patient A & O X 3, D/C instructions reviewed, and sits independently.

## 2014-03-05 NOTE — Progress Notes (Signed)
R genicular nerve blocks under fluoroscopic guidance  Indication Chronic severe post operative knee pain that has not responded to PT, medication, and other conservative care.  Informed consent was obtained after discussing risks and benefits of procedure with the patient.  These include bleeding, bruising and infection as well as foot numbness. Pt place in supine position on the fluoro table .  Static images identified distal femur and proximal tibia.  Lateral and medial supracondylar , as well as medial tibial flare prepped with betadine.  Marked under fluoro and then a 25 g 1.5 inch needle was used to anesthetize skin and subcutaneous tissue. 1.5 cc was infiltrate into each of 3 sites. Then a 22-gauge 3.5 inch needle was inserted under fluoroscopic guidance first targeting the medial tibial flare midpoint. After bone contact was made lateral images confirmed proper positioning and Omnipaque 180 times one ML was injected. This showed no evidence of intravascular uptake. Then 2 ML of the solution containing one ML of Celestone 6 mg per mL with 4 ML of 2% lidocaine was injected. This same procedure was treated for the lateral and medial supracondylar areas. Patient tolerated procedure well. Post procedure instructions given.  Pre injectionpain 7/10 Post injection pain 4/10

## 2014-03-05 NOTE — Patient Instructions (Signed)
received 3 nerve blocks today The genicular nerves were blocked This blocks pain from the knee. Please keep track of ear pain for the next 24-48 hours.  You may resume work and your usual activities such as driving tomorrow. Medication injected with Celestone and lidocaine as well as Omnipaque for contrast

## 2014-03-14 ENCOUNTER — Telehealth: Payer: Self-pay | Admitting: *Deleted

## 2014-03-14 NOTE — Telephone Encounter (Signed)
Cheyenne Graham called to see if you were going to do the other knee (left).  She said the genicular RF on right gave her at least 50% relief.  Now she notices the left hurting more.  She is planning to have a revision of the TKR on right and would like the left to be done before she does that so she will be in better condition when she has it done.  Her next appt is not until the last of August (8/21) and it is only for a 15 min follow up.  There is nothing available sooner if you were to want to do it (30 min slot).  Please advise

## 2014-03-18 NOTE — Telephone Encounter (Signed)
Place on cancellation list I may open a day on 7/31

## 2014-03-18 NOTE — Telephone Encounter (Signed)
See Dr Letta Pate note to place on cancellation list for a 30 min slot.  Ms Feely notified by voicemail to call the office.

## 2014-03-20 ENCOUNTER — Other Ambulatory Visit: Payer: Self-pay | Admitting: Physical Medicine & Rehabilitation

## 2014-03-20 ENCOUNTER — Telehealth: Payer: Self-pay | Admitting: *Deleted

## 2014-03-20 NOTE — Telephone Encounter (Signed)
I offered Cheyenne Graham the only slot available on 7/31 for the genicular nerve block but she is going to be out of town on that date.  I told her if there is a cancellation we will let her know. I will call in her refill.

## 2014-04-05 ENCOUNTER — Ambulatory Visit: Payer: Federal, State, Local not specified - PPO | Admitting: Physical Medicine & Rehabilitation

## 2014-04-11 ENCOUNTER — Encounter: Payer: Self-pay | Admitting: Physical Medicine & Rehabilitation

## 2014-04-26 ENCOUNTER — Encounter: Payer: Self-pay | Admitting: Physical Medicine & Rehabilitation

## 2014-04-26 ENCOUNTER — Ambulatory Visit (HOSPITAL_BASED_OUTPATIENT_CLINIC_OR_DEPARTMENT_OTHER): Payer: Federal, State, Local not specified - PPO | Admitting: Physical Medicine & Rehabilitation

## 2014-04-26 ENCOUNTER — Encounter: Payer: Federal, State, Local not specified - PPO | Attending: Physical Medicine & Rehabilitation

## 2014-04-26 VITALS — BP 135/82 | HR 87 | Resp 14

## 2014-04-26 DIAGNOSIS — M1710 Unilateral primary osteoarthritis, unspecified knee: Secondary | ICD-10-CM | POA: Diagnosis present

## 2014-04-26 DIAGNOSIS — M25569 Pain in unspecified knee: Secondary | ICD-10-CM | POA: Insufficient documentation

## 2014-04-26 DIAGNOSIS — M25519 Pain in unspecified shoulder: Secondary | ICD-10-CM | POA: Insufficient documentation

## 2014-04-26 DIAGNOSIS — M1712 Unilateral primary osteoarthritis, left knee: Secondary | ICD-10-CM

## 2014-04-26 DIAGNOSIS — G8928 Other chronic postprocedural pain: Secondary | ICD-10-CM

## 2014-04-26 DIAGNOSIS — Z96659 Presence of unspecified artificial knee joint: Secondary | ICD-10-CM | POA: Diagnosis not present

## 2014-04-26 DIAGNOSIS — M542 Cervicalgia: Secondary | ICD-10-CM | POA: Diagnosis not present

## 2014-04-26 MED ORDER — TRAMADOL HCL 50 MG PO TABS: 50.0000 mg | ORAL_TABLET | Freq: Every day | ORAL | Status: DC

## 2014-04-26 NOTE — Progress Notes (Deleted)
   Subjective:    Patient ID: Cheyenne Graham, female    DOB: [birth year], 48 y.o.   MRN: [id number]  HPI    Review of Systems     Objective:   Physical Exam        Assessment & Plan:

## 2014-04-26 NOTE — Progress Notes (Signed)
Subjective:    Patient ID: Cheyenne Graham, female    DOB: 04-04-1966, 48 y.o.   MRN: [id number] July 1 , 2015 R genicular nerve blocks under fluoroscopic guidance  HPI Nerve block helped for about 2 weeks.~50% , medial aspect of right knee still has less pain than usual. The left knee is actually more painful than the right knee now.  Tylenol 3, 1 tablet once or twice per day mainly in the evening  Working full time but not 40 hours a week for the last 2 months secondary to poor standing tolerance   Took tramadol in the past which was partially effective  Some stomach upset reported with the ibuprofen  Pain Inventory Average Pain 6 Pain Right Now 9 My pain is sharp and burning  In the last 24 hours, has pain interfered with the following? General activity 8 Relation with others 7 Enjoyment of life 8 What TIME of day is your pain at its worst? morning, night Sleep (in general) Poor  Pain is worse with: walking, bending, standing and some activites Pain improves with: rest and heat/ice Relief from Meds: 6  Mobility walk without assistance walk with assistance use a cane how many minutes can you walk? 5 ability to climb steps?  yes do you drive?  yes transfers alone Do you have any goals in this area?  yes  Function I need assistance with the following:  shopping  Neuro/Psych No problems in this area  Prior Studies Any changes since last visit?  no  Physicians involved in your care Any changes since last visit?  no   Family History  Problem Relation Age of Onset  . Hypertension Mother   . Cancer Mother   . Hypertension Father   . Asthma Sister   . Migraines Sister    History   Social History  . Marital Status: Divorced    Spouse Name: N/A    Number of Children: N/A  . Years of Education: N/A   Social History Main Topics  . Smoking status: Never Smoker   . Smokeless tobacco: Never Used  . Alcohol Use: 0.0 oz/week    0 Glasses of wine per week   Comment: occasional  . Drug Use: No  . Sexual Activity: Yes    Birth Control/ Protection: Surgical   Other Topics Concern  . None   Social History Narrative  . None   Past Surgical History  Procedure Laterality Date  . Knee arthroscopy    . Tubal ligation    . Knee surgery  02/22/2012  . Total knee arthroplasty  02/22/2012    Procedure: TOTAL KNEE ARTHROPLASTY;  Surgeon: Garald Balding, MD;  Location: [facility];  Service: Orthopedics;  Laterality: Right;   Past Medical History  Diagnosis Date  . Arthritis   . Pneumonia        . GERD (gastroesophageal reflux disease)     decreased since she is not taking advail  . Constipation   . Migraine      2 a week  . Migraine    BP 135/82  Pulse 87  Resp 14  SpO2 99%  Opioid Risk Score:   Fall Risk Score: Low Fall Risk (0-5 points) (pt educated, declined handout)    Review of Systems  All other systems reviewed and are negative.      Objective:   Physical Exam  Musculoskeletal:       Right knee: Tenderness found. Patellar tendon tenderness noted. No medial  joint line and no lateral joint line tenderness noted.       Left knee: Tenderness found. Medial joint line tenderness noted. No patellar tendon tenderness noted.  Left quadriceps tendon insertion pain to palpation  Full knee extension right Full knee extension left  Flexion to 90 right Flexion to 90 left  Neurological:  Left lateral knee and proximal leg numbness with pinprick          Assessment & Plan:#1. Chronic postoperative pain right knee status post TK R.   2.   2. Left knee osteoarthritis which has not responded to cortisone or visco supplementation.  This is actually the more painful side at the current time. Will schedule for left genicular nerve blocks under fluoroscopic guidance  We discussed medications. Tylenol for it is helpful somewhat during the evening but does not want to take this during work hours.  We'll try tramadol 50 mg every  morning. This will be in place of ibuprofen which is starting to cause stomach irritation

## 2014-04-26 NOTE — Patient Instructions (Signed)
Take tramadol in the morning and Tylenol four in the pm

## 2014-05-31 ENCOUNTER — Other Ambulatory Visit: Payer: Self-pay | Admitting: Physical Medicine & Rehabilitation

## 2014-06-04 ENCOUNTER — Encounter: Payer: Self-pay | Admitting: Physical Medicine & Rehabilitation

## 2014-06-04 ENCOUNTER — Encounter: Payer: Federal, State, Local not specified - PPO | Attending: Physical Medicine & Rehabilitation

## 2014-06-04 ENCOUNTER — Ambulatory Visit (HOSPITAL_BASED_OUTPATIENT_CLINIC_OR_DEPARTMENT_OTHER): Payer: Federal, State, Local not specified - PPO | Admitting: Physical Medicine & Rehabilitation

## 2014-06-04 VITALS — HR 80 | Resp 14

## 2014-06-04 DIAGNOSIS — G8928 Other chronic postprocedural pain: Secondary | ICD-10-CM | POA: Diagnosis not present

## 2014-06-04 DIAGNOSIS — Z96659 Presence of unspecified artificial knee joint: Secondary | ICD-10-CM | POA: Diagnosis not present

## 2014-06-04 DIAGNOSIS — M542 Cervicalgia: Secondary | ICD-10-CM | POA: Insufficient documentation

## 2014-06-04 DIAGNOSIS — M1710 Unilateral primary osteoarthritis, unspecified knee: Secondary | ICD-10-CM | POA: Insufficient documentation

## 2014-06-04 DIAGNOSIS — M25569 Pain in unspecified knee: Secondary | ICD-10-CM

## 2014-06-04 DIAGNOSIS — Z79899 Other long term (current) drug therapy: Secondary | ICD-10-CM

## 2014-06-04 DIAGNOSIS — M25519 Pain in unspecified shoulder: Secondary | ICD-10-CM | POA: Diagnosis not present

## 2014-06-04 DIAGNOSIS — M25562 Pain in left knee: Principal | ICD-10-CM

## 2014-06-04 DIAGNOSIS — Z5181 Encounter for therapeutic drug level monitoring: Secondary | ICD-10-CM

## 2014-06-04 DIAGNOSIS — M1712 Unilateral primary osteoarthritis, left knee: Secondary | ICD-10-CM

## 2014-06-04 DIAGNOSIS — G8929 Other chronic pain: Secondary | ICD-10-CM

## 2014-06-04 MED ORDER — ACETAMINOPHEN-CODEINE #4 300-60 MG PO TABS: ORAL_TABLET | ORAL | Status: DC

## 2014-06-04 NOTE — Progress Notes (Signed)
Left  genicular nerve blocks under fluoroscopic guidance  Indication Chronic severe osteoarthritis knee pain that has not responded to PT, medication, and other conservative care.  Informed consent was obtained after discussing risks and benefits of procedure with the patient.  These include bleeding, bruising and infection as well as foot numbness. Pt place in supine position on the fluoro table .  Static images identified distal femur and proximal tibia.  Lateral and medial supracondylar , as well as medial tibial flare prepped with betadine.  Marked under fluoro and then a 25 g 1.5 inch needle was used to anesthetize skin and subcutaneous tissue. 1.5 cc was infiltrate into each of 3 sites. Then a 22-gauge 3.5 inch needle was inserted under fluoroscopic guidance first targeting the medial tibial flare midpoint. After bone contact was made lateral images confirmed proper positioning and Omnipaque 180 times one ML was injected. This showed no evidence of intravascular uptake. Then 2 ML of the solution containing one ML of Celestone 6 mg per mL with 4 ML of 2% lidocaine was injected. This same procedure was treated for the lateral and medial supracondylar areas. Patient tolerated procedure well. Post procedure instructions given.  Pre injectionpain 7/10 Post injection pain 3/10

## 2014-06-04 NOTE — Patient Instructions (Signed)
received a genicular nerve block with lidocaine and Celestone please keep track of your knee pain post procedure. This can be repeated or if only temporary we can do radio frequency ablation

## 2014-06-04 NOTE — Addendum Note (Signed)
Addended by: Valeria Batman on: 06/04/2014 02:08 PM   Modules accepted: Orders

## 2014-06-04 NOTE — Progress Notes (Addendum)
  PROCEDURE RECORD [facility]   Name: BRIAR SWORD DOB:20-Feb-1966 MRN: [id number]  Date:06/04/2014  Physician: Alysia Penna, MD    Nurse/CMA:Shumaker RN  Allergies:  Allergies  Allergen Reactions  . Shrimp [Shellfish Allergy] Nausea And Vomiting    Consent Signed: Yes.    Is patient diabetic? No.  CBG today?   Pregnant: No. LMP: No LMP recorded. (age 49-55)  Anticoagulants: no Anti-inflammatory: no Antibiotics: no  Procedure: Left genicular nerve block Position: Supine Start Time: 1:03 End Time: 1:11 Fluoro Time: 23 seconds  RN/CMA Ginkle CMA Shumaker RN    Time 12:50 1:15    BP  144/93    Pulse 80 90    Respirations 14 14    O2 Sat 100 96    S/S 6 6    Pain Level 7/10 3/10     D/C home with , patient A & O X 3, D/C instructions reviewed, and sits independently.

## 2014-06-07 ENCOUNTER — Encounter: Payer: Self-pay | Admitting: Physical Medicine & Rehabilitation

## 2014-06-10 ENCOUNTER — Other Ambulatory Visit (HOSPITAL_COMMUNITY): Payer: Self-pay | Admitting: *Deleted

## 2014-06-10 ENCOUNTER — Encounter (HOSPITAL_COMMUNITY): Payer: Self-pay

## 2014-06-10 ENCOUNTER — Encounter (HOSPITAL_COMMUNITY)
Admission: RE | Admit: 2014-06-10 | Discharge: 2014-06-10 | Disposition: A | Discharge status: Home / Self Care | Payer: Federal, State, Local not specified - PPO | Source: Ambulatory Visit | Attending: Orthopaedic Surgery | Admitting: Orthopaedic Surgery

## 2014-06-10 ENCOUNTER — Encounter (HOSPITAL_COMMUNITY): Payer: Self-pay | Admitting: Pharmacy Technician

## 2014-06-10 DIAGNOSIS — K219 Gastro-esophageal reflux disease without esophagitis: Secondary | ICD-10-CM | POA: Insufficient documentation

## 2014-06-10 DIAGNOSIS — Y793 Surgical instruments, materials and orthopedic devices (including sutures) associated with adverse incidents: Secondary | ICD-10-CM | POA: Diagnosis not present

## 2014-06-10 DIAGNOSIS — T8484XA Pain due to internal orthopedic prosthetic devices, implants and grafts, initial encounter: Secondary | ICD-10-CM | POA: Diagnosis not present

## 2014-06-10 DIAGNOSIS — B009 Herpesviral infection, unspecified: Secondary | ICD-10-CM | POA: Insufficient documentation

## 2014-06-10 DIAGNOSIS — Z0000 Encounter for general adult medical examination without abnormal findings: Secondary | ICD-10-CM | POA: Insufficient documentation

## 2014-06-10 DIAGNOSIS — M1990 Unspecified osteoarthritis, unspecified site: Secondary | ICD-10-CM | POA: Insufficient documentation

## 2014-06-10 DIAGNOSIS — M1712 Unilateral primary osteoarthritis, left knee: Secondary | ICD-10-CM | POA: Diagnosis not present

## 2014-06-10 DIAGNOSIS — G43909 Migraine, unspecified, not intractable, without status migrainosus: Secondary | ICD-10-CM | POA: Insufficient documentation

## 2014-06-10 DIAGNOSIS — E669 Obesity, unspecified: Secondary | ICD-10-CM | POA: Insufficient documentation

## 2014-06-10 DIAGNOSIS — Z6834 Body mass index (BMI) 34.0-34.9, adult: Secondary | ICD-10-CM | POA: Insufficient documentation

## 2014-06-10 DIAGNOSIS — Z91013 Allergy to seafood: Secondary | ICD-10-CM | POA: Diagnosis not present

## 2014-06-10 HISTORY — DX: Herpesviral infection, unspecified: B00.9

## 2014-06-10 HISTORY — DX: Family history of other specified conditions: Z84.89

## 2014-06-10 LAB — COMPREHENSIVE METABOLIC PANEL
ALK PHOS: 72 U/L (ref 39–117)
ALT: 20 U/L (ref 0–35)
ANION GAP: 13 (ref 5–15)
AST: 20 U/L (ref 0–37)
Albumin: 3.6 g/dL (ref 3.5–5.2)
BUN: 15 mg/dL (ref 6–23)
CHLORIDE: 104 meq/L (ref 96–112)
CO2: 24 meq/L (ref 19–32)
CREATININE: 0.67 mg/dL (ref 0.50–1.10)
Calcium: 8.9 mg/dL (ref 8.4–10.5)
GFR calc Af Amer: >90 mL/min (ref >90)
GLUCOSE: 87 mg/dL (ref 70–99)
Potassium: 4.5 mEq/L (ref 3.7–5.3)
Sodium: 141 mEq/L (ref 137–147)
Total Protein: 7.6 g/dL (ref 6.0–8.3)

## 2014-06-10 LAB — TYPE AND SCREEN
ABO/RH(D): B POS
Antibody Screen: NEGATIVE

## 2014-06-10 LAB — CBC WITH DIFFERENTIAL/PLATELET
BASOS PCT: 0 % (ref 0–1)
Basophils Absolute: 0 10*3/uL (ref 0.0–0.1)
Eosinophils Absolute: 0.1 10*3/uL (ref 0.0–0.7)
Eosinophils Relative: 1 % (ref 0–5)
HCT: 37.8 % (ref 36.0–46.0)
Hemoglobin: 12.9 g/dL (ref 12.0–15.0)
LYMPHS PCT: 53 % — AB (ref 12–46)
Lymphs Abs: 2.3 10*3/uL (ref 0.7–4.0)
MCH: 28.8 pg (ref 26.0–34.0)
MCHC: 34.1 g/dL (ref 30.0–36.0)
MCV: 84.4 fL (ref 78.0–100.0)
MONO ABS: 0.3 10*3/uL (ref 0.1–1.0)
Monocytes Relative: 6 % (ref 3–12)
Neutro Abs: 1.7 10*3/uL (ref 1.7–7.7)
Neutrophils Relative %: 40 % — ABNORMAL LOW (ref 43–77)
Platelets: 325 10*3/uL (ref 150–400)
RBC: 4.48 MIL/uL (ref 3.87–5.11)
RDW: 12.3 % (ref 11.5–15.5)
WBC: 4.4 10*3/uL (ref 4.0–10.5)

## 2014-06-10 LAB — URINALYSIS, ROUTINE W REFLEX MICROSCOPIC
Bilirubin Urine: NEGATIVE
Glucose, UA: NEGATIVE mg/dL
Hgb urine dipstick: NEGATIVE
Ketones, ur: NEGATIVE mg/dL
Leukocytes, UA: NEGATIVE
NITRITE: NEGATIVE
PH: 6 (ref 5.0–8.0)
Protein, ur: NEGATIVE mg/dL
SPECIFIC GRAVITY, URINE: 1.021 (ref 1.005–1.030)
Urobilinogen, UA: 1 mg/dL (ref 0.0–1.0)

## 2014-06-10 LAB — SURGICAL PCR SCREEN
MRSA, PCR: NEGATIVE
STAPHYLOCOCCUS AUREUS: NEGATIVE

## 2014-06-10 LAB — PROTIME-INR
INR: 1.04 (ref 0.00–1.49)
Prothrombin Time: 13.6 seconds (ref 11.6–15.2)

## 2014-06-10 LAB — HCG, SERUM, QUALITATIVE: Preg, Serum: NEGATIVE

## 2014-06-10 LAB — APTT: aPTT: 27 seconds (ref 24–37)

## 2014-06-10 NOTE — Pre-Procedure Instructions (Signed)
Cheyenne Graham  06/10/2014   Your procedure is scheduled on:  Tuesday, June 18, 2014 at 7:15 AM.   Report to [facility] Entrance "A" Admitting Office at 5:30 AM.   Call this number if you have problems the morning of surgery: [phone]   Remember:   Do not eat food or drink liquids after midnight Monday, 06/17/14   Take these medicines the morning of surgery with A SIP OF WATER:    Do not wear jewelry, make-up or nail polish.  Do not wear lotions, powders, or perfumes. You may wear deodorant.  Do not shave 48 hours prior to surgery.   Do not bring valuables to the hospital.  [facility] is not responsible                  for any belongings or valuables.               Contacts, dentures or bridgework may not be worn into surgery.  Leave suitcase in the car. After surgery it may be brought to your room.  For patients admitted to the hospital, discharge time is determined by your                treatment team.               Special Instructions: .[facility] - Preparing for Surgery  Before surgery, you can play an important role.  Because skin is not sterile, your skin needs to be as free of germs as possible.  You can reduce the number of germs on you skin by washing with CHG (chlorahexidine gluconate) soap before surgery.  CHG is an antiseptic cleaner which kills germs and bonds with the skin to continue killing germs even after washing.  Please DO NOT use if you have an allergy to CHG or antibacterial soaps.  If your skin becomes reddened/irritated stop using the CHG and inform your nurse when you arrive at Short Stay.  Do not shave (including legs and underarms) for at least 48 hours prior to the first CHG shower.  You may shave your face.  Please follow these instructions carefully:   1.  Shower with CHG Soap the night before surgery and the                                morning of Surgery.  2.  If you choose to wash your hair, wash your hair first as usual with  your       normal shampoo.  3.  After you shampoo, rinse your hair and body thoroughly to remove the                      Shampoo.  4.  Use CHG as you would any other liquid soap.  You can apply chg directly       to the skin and wash gently with scrungie or a clean washcloth.  5.  Apply the CHG Soap to your body ONLY FROM THE NECK DOWN.        Do not use on open wounds or open sores.  Avoid contact with your eyes, ears, mouth and genitals (private parts).  Wash genitals (private parts) with your normal soap.  6.  Wash thoroughly, paying special attention to the area where your surgery        will be performed.  7.  Thoroughly rinse  your body with warm water from the neck down.  8.  DO NOT shower/wash with your normal soap after using and rinsing off       the CHG Soap.  9.  Pat yourself dry with a clean towel.            10.  Wear clean pajamas.            11.  Place clean sheets on your bed the night of your first shower and do not        sleep with pets.  Day of Surgery  Do not apply any lotions the morning of surgery.  Please wear clean clothes to the hospital/surgery center.     Please read over the following fact sheets that you were given: Pain Booklet, Coughing and Deep Breathing, Blood Transfusion Information, MRSA Information and Surgical Site Infection Prevention
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
## 2014-06-10 NOTE — Pre-Procedure Instructions (Addendum)
Cheyenne Graham  06/10/2014   Your procedure is scheduled on:  Tuesday, June 18, 2014 at 7:15 AM.   Report to [facility] Entrance "A" Admitting Office at 5:30 AM.   Call this number if you have problems the morning of surgery: [phone]   Remember:   Do not eat food or drink liquids after midnight Monday, 06/17/14   Take these medicines the morning of surgery with A SIP OF WATER: valACYclovir (VALTREX). Take if needed: Tylenol #4.                Stop taking Vitamins and NSAIDs.   Do not wear jewelry, make-up or nail polish.  Do not wear lotions, powders, or perfumes. You may wear deodorant.  Do not shave 48 hours prior to surgery.   Do not bring valuables to the hospital.                [facility] is not responsible for any belongings or valuables.               Contacts, dentures or bridgework may not be worn into surgery.  Leave suitcase in the car. After surgery it may be brought to your room.  For patients admitted to the hospital, discharge time is determined by your treatment team.                 Please read over the following fact sheets that you were given: Pain Booklet, Coughing and Deep Breathing, Blood Transfusion Information, MRSA Information and Surgical Site Infection Prevention

## 2014-06-10 NOTE — Pre-Procedure Instructions (Signed)
Cheyenne Graham  06/10/2014   Your procedure is scheduled on:  Tuesday, June 18, 2014 at 7:15 AM.   Report to [facility] Entrance "A" Admitting Office at 5:30 AM.   Call this number if you have problems the morning of surgery: [phone]   Remember:   Do not eat food or drink liquids after midnight Monday, 06/17/14   Take these medicines the morning of surgery with A SIP OF WATER:    Do not wear jewelry, make-up or nail polish.  Do not wear lotions, powders, or perfumes.               Do not shave 48 hours prior to surgery.   Do not bring valuables to the hospital.              [facility] is not responsible  for any belongings or valuables.               Contacts, dentures or bridgework may not be worn into surgery.  Leave suitcase in the car. After surgery it may be brought to your room.  For patients admitted to the hospital, discharge time is determined by your treatment team.   Please read over the following fact sheets that you were given: Pain Booklet, Coughing and Deep Breathing, Blood Transfusion Information, MRSA Information and Surgical Site Infection Prevention

## 2014-06-11 LAB — URINE CULTURE

## 2014-06-17 MED ORDER — ACETAMINOPHEN 10 MG/ML IV SOLN
1000.0000 mg | Freq: Four times a day (QID) | INTRAVENOUS | Status: DC
  Administered 2014-06-18: 1000 mg via INTRAVENOUS
  Filled 2014-06-17: qty 100

## 2014-06-18 ENCOUNTER — Inpatient Hospital Stay (HOSPITAL_COMMUNITY)
Admission: RE | Admit: 2014-06-18 | Discharge: 2014-06-20 | DRG: 467 | Disposition: A | Discharge status: Home Health | Payer: Federal, State, Local not specified - PPO | Source: Ambulatory Visit | Attending: Orthopaedic Surgery | Admitting: Orthopaedic Surgery

## 2014-06-18 ENCOUNTER — Encounter (HOSPITAL_COMMUNITY)
Admission: RE | Disposition: A | Discharge status: Home Health | Payer: Self-pay | Source: Ambulatory Visit | Attending: Orthopaedic Surgery

## 2014-06-18 ENCOUNTER — Encounter (HOSPITAL_COMMUNITY): Payer: Self-pay | Admitting: Certified Registered Nurse Anesthetist

## 2014-06-18 ENCOUNTER — Encounter (HOSPITAL_COMMUNITY): Payer: Federal, State, Local not specified - PPO | Admitting: Anesthesiology

## 2014-06-18 ENCOUNTER — Inpatient Hospital Stay (HOSPITAL_COMMUNITY): Payer: Federal, State, Local not specified - PPO | Admitting: Anesthesiology

## 2014-06-18 DIAGNOSIS — K219 Gastro-esophageal reflux disease without esophagitis: Secondary | ICD-10-CM | POA: Diagnosis present

## 2014-06-18 DIAGNOSIS — Z6834 Body mass index (BMI) 34.0-34.9, adult: Secondary | ICD-10-CM

## 2014-06-18 DIAGNOSIS — M1990 Unspecified osteoarthritis, unspecified site: Secondary | ICD-10-CM | POA: Diagnosis present

## 2014-06-18 DIAGNOSIS — D62 Acute posthemorrhagic anemia: Secondary | ICD-10-CM | POA: Diagnosis not present

## 2014-06-18 DIAGNOSIS — T84032A Mechanical loosening of internal right knee prosthetic joint, initial encounter: Secondary | ICD-10-CM | POA: Diagnosis present

## 2014-06-18 DIAGNOSIS — Z96651 Presence of right artificial knee joint: Secondary | ICD-10-CM

## 2014-06-18 HISTORY — PX: KNEE SURGERY: SHX244

## 2014-06-18 HISTORY — PX: TOTAL KNEE REVISION: SHX996

## 2014-06-18 SURGERY — TOTAL KNEE REVISION
Anesthesia: General | Site: Knee | Laterality: Right

## 2014-06-18 MED ORDER — LABETALOL HCL 5 MG/ML IV SOLN
INTRAVENOUS | Status: AC
  Filled 2014-06-18: qty 4

## 2014-06-18 MED ORDER — FENTANYL CITRATE 0.05 MG/ML IJ SOLN
INTRAMUSCULAR | Status: DC | PRN
  Administered 2014-06-18: 100 ug via INTRAVENOUS
  Administered 2014-06-18 (×10): 50 ug via INTRAVENOUS

## 2014-06-18 MED ORDER — LIDOCAINE HCL (CARDIAC) 20 MG/ML IV SOLN
INTRAVENOUS | Status: DC | PRN
  Administered 2014-06-18: 60 mg via INTRAVENOUS

## 2014-06-18 MED ORDER — VANCOMYCIN HCL 1000 MG IV SOLR
INTRAVENOUS | Status: AC
  Filled 2014-06-18: qty 2000

## 2014-06-18 MED ORDER — ROPIVACAINE HCL 5 MG/ML IJ SOLN
INTRAMUSCULAR | Status: DC | PRN
  Administered 2014-06-18: 25 mL via PERINEURAL

## 2014-06-18 MED ORDER — FLEET ENEMA 7-19 GM/118ML RE ENEM: 1.0000 | ENEMA | Freq: Once | RECTAL | Status: AC | PRN

## 2014-06-18 MED ORDER — FENTANYL CITRATE 0.05 MG/ML IJ SOLN
INTRAMUSCULAR | Status: AC
  Filled 2014-06-18: qty 5

## 2014-06-18 MED ORDER — NEOSTIGMINE METHYLSULFATE 10 MG/10ML IV SOLN
INTRAVENOUS | Status: AC
  Filled 2014-06-18: qty 1

## 2014-06-18 MED ORDER — METOCLOPRAMIDE HCL 10 MG PO TABS: 5.0000 mg | ORAL_TABLET | Freq: Three times a day (TID) | ORAL | Status: DC | PRN

## 2014-06-18 MED ORDER — LIDOCAINE HCL (CARDIAC) 20 MG/ML IV SOLN
INTRAVENOUS | Status: AC
  Filled 2014-06-18: qty 5

## 2014-06-18 MED ORDER — RIVAROXABAN 10 MG PO TABS
10.0000 mg | ORAL_TABLET | ORAL | Status: DC
  Administered 2014-06-18 – 2014-06-19 (×2): 10 mg via ORAL
  Filled 2014-06-18 (×3): qty 1

## 2014-06-18 MED ORDER — CHLORHEXIDINE GLUCONATE 4 % EX LIQD
60.0000 mL | Freq: Every day | CUTANEOUS | Status: DC
  Filled 2014-06-18: qty 60

## 2014-06-18 MED ORDER — DEXTROSE 5 % IV SOLN
500.0000 mg | Freq: Four times a day (QID) | INTRAVENOUS | Status: DC | PRN
  Filled 2014-06-18: qty 5

## 2014-06-18 MED ORDER — DEXAMETHASONE SODIUM PHOSPHATE 4 MG/ML IJ SOLN
INTRAMUSCULAR | Status: DC | PRN
  Administered 2014-06-18: 8 mg via INTRAVENOUS

## 2014-06-18 MED ORDER — SODIUM CHLORIDE 0.9 % IV SOLN: INTRAVENOUS | Status: DC

## 2014-06-18 MED ORDER — GLYCOPYRROLATE 0.2 MG/ML IJ SOLN
INTRAMUSCULAR | Status: AC
  Filled 2014-06-18: qty 3

## 2014-06-18 MED ORDER — MENTHOL 3 MG MT LOZG
1.0000 | LOZENGE | OROMUCOSAL | Status: DC | PRN
  Filled 2014-06-18: qty 9

## 2014-06-18 MED ORDER — OXYCODONE HCL 5 MG PO TABS
5.0000 mg | ORAL_TABLET | Freq: Once | ORAL | Status: AC | PRN
  Administered 2014-06-18: 5 mg via ORAL

## 2014-06-18 MED ORDER — PROPOFOL 10 MG/ML IV BOLUS
INTRAVENOUS | Status: AC
  Filled 2014-06-18: qty 20

## 2014-06-18 MED ORDER — METHOCARBAMOL 500 MG PO TABS
500.0000 mg | ORAL_TABLET | Freq: Four times a day (QID) | ORAL | Status: DC | PRN
  Administered 2014-06-18 – 2014-06-20 (×6): 500 mg via ORAL
  Filled 2014-06-18 (×6): qty 1

## 2014-06-18 MED ORDER — ROCURONIUM BROMIDE 50 MG/5ML IV SOLN
INTRAVENOUS | Status: AC
  Filled 2014-06-18: qty 1

## 2014-06-18 MED ORDER — KETOROLAC TROMETHAMINE 15 MG/ML IJ SOLN
INTRAMUSCULAR | Status: AC
  Filled 2014-06-18: qty 1

## 2014-06-18 MED ORDER — KETOROLAC TROMETHAMINE 15 MG/ML IJ SOLN: 15.0000 mg | Freq: Four times a day (QID) | INTRAMUSCULAR | Status: DC

## 2014-06-18 MED ORDER — ACETAMINOPHEN 500 MG PO TABS
1000.0000 mg | ORAL_TABLET | Freq: Four times a day (QID) | ORAL | Status: AC
  Administered 2014-06-18 – 2014-06-19 (×4): 1000 mg via ORAL
  Filled 2014-06-18 (×4): qty 2

## 2014-06-18 MED ORDER — DEXAMETHASONE SODIUM PHOSPHATE 4 MG/ML IJ SOLN
INTRAMUSCULAR | Status: AC
  Filled 2014-06-18: qty 2

## 2014-06-18 MED ORDER — SODIUM CHLORIDE 0.9 % IR SOLN
Status: DC | PRN
  Administered 2014-06-18: 3000 mL
  Administered 2014-06-18: 1000 mL

## 2014-06-18 MED ORDER — PROMETHAZINE HCL 25 MG/ML IJ SOLN: 6.2500 mg | INTRAMUSCULAR | Status: DC | PRN

## 2014-06-18 MED ORDER — LABETALOL HCL 5 MG/ML IV SOLN
INTRAVENOUS | Status: DC | PRN
  Administered 2014-06-18 (×4): 5 mg via INTRAVENOUS

## 2014-06-18 MED ORDER — LACTATED RINGERS IV SOLN
INTRAVENOUS | Status: DC | PRN
  Administered 2014-06-18 (×3): via INTRAVENOUS

## 2014-06-18 MED ORDER — SCOPOLAMINE 1 MG/3DAYS TD PT72
MEDICATED_PATCH | TRANSDERMAL | Status: DC | PRN
  Administered 2014-06-18: 1 via TRANSDERMAL

## 2014-06-18 MED ORDER — OXYCODONE HCL 5 MG PO TABS
5.0000 mg | ORAL_TABLET | ORAL | Status: DC | PRN
  Administered 2014-06-18 (×2): 10 mg via ORAL
  Administered 2014-06-18: 5 mg via ORAL
  Administered 2014-06-19 – 2014-06-20 (×10): 10 mg via ORAL
  Filled 2014-06-18 (×12): qty 2

## 2014-06-18 MED ORDER — BUPIVACAINE-EPINEPHRINE (PF) 0.25% -1:200000 IJ SOLN
INTRAMUSCULAR | Status: DC | PRN
  Administered 2014-06-18: 24 mL

## 2014-06-18 MED ORDER — HYDROMORPHONE HCL 1 MG/ML IJ SOLN
0.2500 mg | INTRAMUSCULAR | Status: DC | PRN
  Administered 2014-06-18 (×4): 0.5 mg via INTRAVENOUS

## 2014-06-18 MED ORDER — HYDROMORPHONE HCL 1 MG/ML IJ SOLN
1.0000 mg | INTRAMUSCULAR | Status: DC | PRN
  Administered 2014-06-18 – 2014-06-20 (×11): 1 mg via INTRAVENOUS
  Filled 2014-06-18 (×11): qty 1

## 2014-06-18 MED ORDER — METHOCARBAMOL 500 MG PO TABS
ORAL_TABLET | ORAL | Status: AC
  Filled 2014-06-18: qty 1

## 2014-06-18 MED ORDER — PANTOPRAZOLE SODIUM 40 MG PO TBEC
80.0000 mg | DELAYED_RELEASE_TABLET | Freq: Every day | ORAL | Status: DC
  Administered 2014-06-18 – 2014-06-20 (×3): 80 mg via ORAL
  Filled 2014-06-18 (×3): qty 2

## 2014-06-18 MED ORDER — ONDANSETRON HCL 4 MG/2ML IJ SOLN
INTRAMUSCULAR | Status: AC
  Filled 2014-06-18: qty 2

## 2014-06-18 MED ORDER — OXYCODONE HCL 5 MG/5ML PO SOLN: 5.0000 mg | Freq: Once | ORAL | Status: AC | PRN

## 2014-06-18 MED ORDER — BUPIVACAINE-EPINEPHRINE (PF) 0.25% -1:200000 IJ SOLN
INTRAMUSCULAR | Status: AC
  Filled 2014-06-18: qty 30

## 2014-06-18 MED ORDER — ARTIFICIAL TEARS OP OINT
TOPICAL_OINTMENT | OPHTHALMIC | Status: DC | PRN
  Administered 2014-06-18: 1 via OPHTHALMIC

## 2014-06-18 MED ORDER — PROPOFOL 10 MG/ML IV BOLUS
INTRAVENOUS | Status: DC | PRN
  Administered 2014-06-18: 50 mg via INTRAVENOUS
  Administered 2014-06-18: 150 mg via INTRAVENOUS

## 2014-06-18 MED ORDER — ONDANSETRON HCL 4 MG/2ML IJ SOLN
INTRAMUSCULAR | Status: DC | PRN
  Administered 2014-06-18 (×2): 4 mg via INTRAVENOUS

## 2014-06-18 MED ORDER — HYDROMORPHONE HCL 1 MG/ML IJ SOLN
INTRAMUSCULAR | Status: AC
  Filled 2014-06-18: qty 1

## 2014-06-18 MED ORDER — ONDANSETRON HCL 4 MG/2ML IJ SOLN: 4.0000 mg | Freq: Four times a day (QID) | INTRAMUSCULAR | Status: DC | PRN

## 2014-06-18 MED ORDER — GLYCOPYRROLATE 0.2 MG/ML IJ SOLN
INTRAMUSCULAR | Status: DC | PRN
  Administered 2014-06-18: 0.4 mg via INTRAVENOUS

## 2014-06-18 MED ORDER — CHLORHEXIDINE GLUCONATE 4 % EX LIQD
60.0000 mL | Freq: Once | CUTANEOUS | Status: DC
  Filled 2014-06-18: qty 60

## 2014-06-18 MED ORDER — POLYETHYLENE GLYCOL 3350 17 G PO PACK: 17.0000 g | PACK | Freq: Every day | ORAL | Status: DC | PRN

## 2014-06-18 MED ORDER — BISACODYL 5 MG PO TBEC: 5.0000 mg | DELAYED_RELEASE_TABLET | Freq: Every day | ORAL | Status: DC | PRN

## 2014-06-18 MED ORDER — ARTIFICIAL TEARS OP OINT
TOPICAL_OINTMENT | OPHTHALMIC | Status: AC
  Filled 2014-06-18: qty 3.5

## 2014-06-18 MED ORDER — CEFAZOLIN SODIUM-DEXTROSE 2-3 GM-% IV SOLR
INTRAVENOUS | Status: AC
  Administered 2014-06-18: 2 g via INTRAVENOUS
  Filled 2014-06-18: qty 50

## 2014-06-18 MED ORDER — ALUM & MAG HYDROXIDE-SIMETH 200-200-20 MG/5ML PO SUSP: 30.0000 mL | ORAL | Status: DC | PRN

## 2014-06-18 MED ORDER — METOCLOPRAMIDE HCL 5 MG/ML IJ SOLN: 5.0000 mg | Freq: Three times a day (TID) | INTRAMUSCULAR | Status: DC | PRN

## 2014-06-18 MED ORDER — ROCURONIUM BROMIDE 100 MG/10ML IV SOLN
INTRAVENOUS | Status: DC | PRN
  Administered 2014-06-18: 50 mg via INTRAVENOUS

## 2014-06-18 MED ORDER — ACETAMINOPHEN 10 MG/ML IV SOLN: 1000.0000 mg | Freq: Four times a day (QID) | INTRAVENOUS | Status: DC

## 2014-06-18 MED ORDER — VANCOMYCIN HCL 1000 MG IV SOLR
INTRAVENOUS | Status: DC | PRN
  Administered 2014-06-18: 2000 mg

## 2014-06-18 MED ORDER — OXYCODONE HCL 5 MG PO TABS
ORAL_TABLET | ORAL | Status: AC
  Administered 2014-06-18: 5 mg via ORAL
  Filled 2014-06-18: qty 2

## 2014-06-18 MED ORDER — VALACYCLOVIR HCL 500 MG PO TABS
1000.0000 mg | ORAL_TABLET | Freq: Every day | ORAL | Status: DC
  Administered 2014-06-18 – 2014-06-20 (×3): 1000 mg via ORAL
  Filled 2014-06-18 (×3): qty 2

## 2014-06-18 MED ORDER — HYDROMORPHONE HCL 1 MG/ML IJ SOLN
INTRAMUSCULAR | Status: AC
  Filled 2014-06-18: qty 2

## 2014-06-18 MED ORDER — PHENOL 1.4 % MT LIQD: 1.0000 | OROMUCOSAL | Status: DC | PRN

## 2014-06-18 MED ORDER — SODIUM CHLORIDE 0.9 % IV SOLN
75.0000 mL/h | INTRAVENOUS | Status: DC
  Administered 2014-06-18: 75 mL/h via INTRAVENOUS

## 2014-06-18 MED ORDER — CEFAZOLIN SODIUM-DEXTROSE 2-3 GM-% IV SOLR
2.0000 g | Freq: Four times a day (QID) | INTRAVENOUS | Status: DC
  Administered 2014-06-18 – 2014-06-19 (×2): 2 g via INTRAVENOUS
  Filled 2014-06-18 (×4): qty 50

## 2014-06-18 MED ORDER — SCOPOLAMINE 1 MG/3DAYS TD PT72
MEDICATED_PATCH | TRANSDERMAL | Status: AC
  Filled 2014-06-18: qty 1

## 2014-06-18 MED ORDER — THROMBIN 20000 UNITS EX KIT
PACK | CUTANEOUS | Status: AC
  Filled 2014-06-18: qty 1

## 2014-06-18 MED ORDER — MIDAZOLAM HCL 2 MG/2ML IJ SOLN
INTRAMUSCULAR | Status: AC
  Filled 2014-06-18: qty 2

## 2014-06-18 MED ORDER — NEOSTIGMINE METHYLSULFATE 10 MG/10ML IV SOLN
INTRAVENOUS | Status: DC | PRN
  Administered 2014-06-18: 3 mg via INTRAVENOUS

## 2014-06-18 MED ORDER — DOCUSATE SODIUM 100 MG PO CAPS
100.0000 mg | ORAL_CAPSULE | Freq: Two times a day (BID) | ORAL | Status: DC
  Administered 2014-06-18 – 2014-06-20 (×4): 100 mg via ORAL
  Filled 2014-06-18 (×5): qty 1

## 2014-06-18 MED ORDER — ONDANSETRON HCL 4 MG PO TABS: 4.0000 mg | ORAL_TABLET | Freq: Four times a day (QID) | ORAL | Status: DC | PRN

## 2014-06-18 MED ORDER — KETOROLAC TROMETHAMINE 15 MG/ML IJ SOLN
7.5000 mg | Freq: Four times a day (QID) | INTRAMUSCULAR | Status: AC
  Administered 2014-06-18 – 2014-06-19 (×3): 7.5 mg via INTRAVENOUS
  Filled 2014-06-18: qty 1

## 2014-06-18 MED ORDER — BUPIVACAINE-EPINEPHRINE (PF) 0.5% -1:200000 IJ SOLN
INTRAMUSCULAR | Status: AC
  Filled 2014-06-18: qty 30

## 2014-06-18 MED ORDER — MIDAZOLAM HCL 5 MG/5ML IJ SOLN
INTRAMUSCULAR | Status: DC | PRN
  Administered 2014-06-18: 2 mg via INTRAVENOUS

## 2014-06-18 SURGICAL SUPPLY — 79 items
ADAPTER BOLT FEMORAL +2/-2 (Knees) ×1 IMPLANT
ADPR FEM +2/-2 OFST BOLT (Knees) ×1 IMPLANT
ADPR FEM 5D STRL KN PFC SGM (Orthopedic Implant) ×1 IMPLANT
ANCH SUT 2 CRKSCW 12X3.5 EYLT (Anchor) ×1 IMPLANT
ANCHOR CORKSCREW 3.5 FIBERWIRE (Anchor) ×2 IMPLANT
AUG FEM SZ2.5 4 CMB POST STRL (Knees) ×2 IMPLANT
AUG FEM SZ2.5 4 STRL LF KN RT (Knees) ×1 IMPLANT
AUG FEM SZ2.5 8 STRL LF KN RT (Knees) ×1 IMPLANT
AUGMENT DIST PFC 4MM SZ 2.5 RT (Knees) IMPLANT
AUGMENT DIST PFC 8MM SZ2.5 RT (Knees) IMPLANT
BANDAGE ESMARK 6X9 LF (GAUZE/BANDAGES/DRESSINGS) ×1 IMPLANT
BLADE SAGITTAL 25.0X1.19X90 (BLADE) ×2 IMPLANT
BLADE SAW SGTL 13.0X1.19X90.0M (BLADE) ×2 IMPLANT
BLADE SAW SGTL NAR THIN XSHT (BLADE) ×1 IMPLANT
BNDG CMPR 9X6 STRL LF SNTH (GAUZE/BANDAGES/DRESSINGS) ×1
BNDG ESMARK 6X9 LF (GAUZE/BANDAGES/DRESSINGS) ×2
BOWL SMART MIX CTS (DISPOSABLE) IMPLANT
CEMENT HV SMART SET (Cement) ×4 IMPLANT
COVER SURGICAL LIGHT HANDLE (MISCELLANEOUS) ×2 IMPLANT
CUFF TOURNIQUET SINGLE 34IN LL (TOURNIQUET CUFF) IMPLANT
CUFF TOURNIQUET SINGLE 44IN (TOURNIQUET CUFF) IMPLANT
DISAL AUG PFC 4MM SZ 2.5 RT (Knees) ×2 IMPLANT
DISAL AUG PFC 8MM SZ2.5 RT (Knees) ×2 IMPLANT
DRAPE EXTREMITY T 121X128X90 (DRAPE) ×2 IMPLANT
DRSG ADAPTIC 3X8 NADH LF (GAUZE/BANDAGES/DRESSINGS) ×2 IMPLANT
DRSG PAD ABDOMINAL 8X10 ST (GAUZE/BANDAGES/DRESSINGS) ×4 IMPLANT
DURAPREP 26ML APPLICATOR (WOUND CARE) ×2 IMPLANT
ELECT REM PT RETURN 9FT ADLT (ELECTROSURGICAL) ×2
ELECTRODE REM PT RTRN 9FT ADLT (ELECTROSURGICAL) ×1 IMPLANT
EVACUATOR 1/8 PVC DRAIN (DRAIN) IMPLANT
FACESHIELD WRAPAROUND (MASK) ×4 IMPLANT
FEM TC3 RT PFC SIGMA SZ2.5 (Orthopedic Implant) ×2 IMPLANT
FEMORAL ADAPTER (Orthopedic Implant) ×2 IMPLANT
FEMORAL TC3 RT PFC SIGMA SZ2.5 (Orthopedic Implant) ×1 IMPLANT
GAUZE SPONGE 4X4 12PLY STRL (GAUZE/BANDAGES/DRESSINGS) ×2 IMPLANT
GLOVE BIOGEL PI IND STRL 8 (GLOVE) ×1 IMPLANT
GLOVE BIOGEL PI IND STRL 8.5 (GLOVE) IMPLANT
GLOVE BIOGEL PI INDICATOR 8 (GLOVE) ×1
GLOVE BIOGEL PI INDICATOR 8.5 (GLOVE)
GLOVE ECLIPSE 8.0 STRL XLNG CF (GLOVE) ×2 IMPLANT
GLOVE SURG ORTHO 8.5 STRL (GLOVE) ×2 IMPLANT
GOWN STRL REUS W/ TWL LRG LVL3 (GOWN DISPOSABLE) ×2 IMPLANT
GOWN STRL REUS W/ TWL XL LVL3 (GOWN DISPOSABLE) ×1 IMPLANT
GOWN STRL REUS W/TWL 2XL LVL3 (GOWN DISPOSABLE) ×2 IMPLANT
GOWN STRL REUS W/TWL LRG LVL3 (GOWN DISPOSABLE) ×4
GOWN STRL REUS W/TWL XL LVL3 (GOWN DISPOSABLE) ×2
HANDPIECE INTERPULSE COAX TIP (DISPOSABLE) ×2
INSERT TC3 RP TIB  2.5 17.5MM (Knees) ×1 IMPLANT
INSERT TC3 RP TIB 2.5 17.5MM (Knees) ×1 IMPLANT
KIT BASIN OR (CUSTOM PROCEDURE TRAY) ×2 IMPLANT
KIT ROOM TURNOVER OR (KITS) ×2 IMPLANT
MANIFOLD NEPTUNE II (INSTRUMENTS) ×2 IMPLANT
NEEDLE 22X1 1/2 (OR ONLY) (NEEDLE) IMPLANT
NS IRRIG 1000ML POUR BTL (IV SOLUTION) ×2 IMPLANT
PACK TOTAL JOINT (CUSTOM PROCEDURE TRAY) ×2 IMPLANT
PAD ABD 8X10 STRL (GAUZE/BANDAGES/DRESSINGS) ×1 IMPLANT
PAD ARMBOARD 7.5X6 YLW CONV (MISCELLANEOUS) ×4 IMPLANT
PAD CAST 4YDX4 CTTN HI CHSV (CAST SUPPLIES) ×1 IMPLANT
PADDING CAST COTTON 4X4 STRL (CAST SUPPLIES) ×2
PATELLA DOME PFC 35MM (Knees) ×1 IMPLANT
POST AUG PFC 4MM SZ 2.5 (Knees) ×4 IMPLANT
SET HNDPC FAN SPRY TIP SCT (DISPOSABLE) ×1 IMPLANT
STAPLER VISISTAT 35W (STAPLE) ×2 IMPLANT
STEM TIBIA PFC 13X60MM (Stem) ×1 IMPLANT
STEM UNIVERSAL REVISION 75X14 (Stem) ×1 IMPLANT
SUCTION FRAZIER TIP 10 FR DISP (SUCTIONS) ×2 IMPLANT
SUT BONE WAX W31G (SUTURE) ×1 IMPLANT
SUT ETHIBOND NAB CT1 #1 30IN (SUTURE) ×4 IMPLANT
SUT MNCRL AB 3-0 PS2 18 (SUTURE) ×1 IMPLANT
SUT VIC AB 0 CT1 27 (SUTURE)
SUT VIC AB 0 CT1 27XBRD ANBCTR (SUTURE) ×1 IMPLANT
SUT VIC AB 2-0 CT1 27 (SUTURE) ×4
SUT VIC AB 2-0 CT1 TAPERPNT 27 (SUTURE) IMPLANT
SUT VIC AB 2-0 FS1 27 (SUTURE) ×3 IMPLANT
SYR CONTROL 10ML LL (SYRINGE) IMPLANT
TOWER CARTRIDGE SMART MIX (DISPOSABLE) ×2 IMPLANT
TRAY FOLEY CATH 16FRSI W/METER (SET/KITS/TRAYS/PACK) ×2 IMPLANT
TRAY REVISION SZ 2.5 (Knees) ×1 IMPLANT
WATER STERILE IRR 1000ML POUR (IV SOLUTION) ×6 IMPLANT

## 2014-06-18 NOTE — H&P (Signed)
  The recent History & Physical has been reviewed. I have personally examined the patient today. There is no interval change to the documented History & Physical. The patient would like to proceed with the procedure.  Cheyenne Graham W 06/18/2014,  7:06 AM

## 2014-06-18 NOTE — Transfer of Care (Signed)
Immediate Anesthesia Transfer of Care Note  Patient: Cheyenne Graham  Procedure(s) Performed: Procedure(s): RIGHT TOTAL KNEE REVISION (Right)  Patient Location: PACU  Anesthesia Type:General  Level of Consciousness: awake, alert , oriented and patient cooperative  Airway & Oxygen Therapy: Patient Spontanous Breathing and Patient connected to nasal cannula oxygen  Post-op Assessment: Report given to PACU RN and Post -op Vital signs reviewed and stable  Post vital signs: Reviewed and stable  Complications: No apparent anesthesia complications

## 2014-06-18 NOTE — Progress Notes (Signed)
SCHEDULED IV ACETAMINOPHEN:  CONVERSION TO ORAL ROUTE to complete the ordered doses.  The Pharmacy and Therapeutics Committee has restricted administration of IV acetaminophen (with a 24 hr maximum duration) to patients who meet both of the following criteria:  Unable to tolerate oral or enteral medication  Contraindication to NSAIDs  Because the patient has taken other oral medications today (Robaxin and Percocet both charted as given after AET), IV acetaminophen has been converted to PO to complete the course of therapy originally ordered.  If PO acetaminophen should be continued beyond the original stop time, please adjust the order accordingly using the "modify" function.   If you have questions about this conversion, please contact the pharmacy department.  Verna Czech, [facility] 06/18/2014 2:18 PM

## 2014-06-18 NOTE — Progress Notes (Signed)
Orthopedic Tech Progress Note Patient Details:  Cheyenne Graham 01/17/1966 [id number]  CPM Right Knee CPM Right Knee: On Right Knee Flexion (Degrees): 60 Right Knee Extension (Degrees): 0 Additional Comments: Trapeze bar and foot roll   Cammer, Theodoro Parma 06/18/2014, 2:36 PM

## 2014-06-18 NOTE — Progress Notes (Signed)
Utilization review completed.  

## 2014-06-18 NOTE — Plan of Care (Signed)
Problem: Consults Goal: Diagnosis- Total Joint Replacement Primary Total Knee Right     

## 2014-06-18 NOTE — Op Note (Signed)
Cheyenne Graham, Cheyenne Graham                 ACCOUNT NO.:  [id number]  MEDICAL RECORD NO.:  [id number]  LOCATION:  [facility]                         FACILITY:  [facility]  PHYSICIAN:  Vonna Kotyk. Berna Gitto, M.D.DATE OF BIRTH:  1966/04/25  DATE OF PROCEDURE:  06/18/2014 DATE OF DISCHARGE:                              OPERATIVE REPORT   PREOPERATIVE DIAGNOSIS:  Painful right total knee replacement.  POSTOPERATIVE DIAGNOSIS:  Aseptically loose right total knee replacement.  PROCEDURE:  Revision of right total knee replacement.  SURGEON:  Vonna Kotyk. Durward Fortes, M.D.  ASSISTANT:  [name] D. Petrarca, P.A.-C., who was present throughout the operative procedure to ensure its timely completion.  ANESTHESIA:  Femoral nerve block with general.  COMPLICATIONS:  None.  COMPONENTS:  On the tibial side, it was a DePuy 2.5 MBT revision stem, 13 x 60-mm stem, patella was a 35-mm button.  On the femoral side, it was a 2.5 TC3 component.  There were posterior augments 4 mm both medially and laterally.  There was a 4-mm lateral distal augment and 8- mm medial augment, 5-degree femoral adapter, a T2 femoral adaptor, a 14 x 75-mm femoral stem and a TC3 poly 2.5 x 17.5 mm.  I used polymethyl methacrylate with added vancomycin.  DESCRIPTION OF PROCEDURE:  Cheyenne Graham was met in the holding area, identified the right knee as appropriate operative site.  She did receive a femoral nerve block per Anesthesia.  The patient was then transported to room #7 and placed under general anesthesia without difficulty.  Nursing staff inserted a Foley catheter.  Urine was clear.  Tourniquet was then applied to the right thigh and a time-out was called.  The right lower extremity was then prepped with chlorhexidine scrub and DuraPrep x2 from the tourniquet to the tips of the toes.  Sterile draping was performed.  With the extremity still elevated, it was Esmarch exsanguinated with a proximal tourniquet at 350 mmHg.  The prior  longitudinal knee incision was elliptically excised.  First layer of capsule was incised in the midline and medial parapatellar incision was made with a Bovie through the old incision outlined by the Ethibond sutures.  The Ethibond sutures were removed.  The joint was entered.  There was a clear yellow joint effusion.  Cultures were sent for anaerobic and aerobic cultures.  Patella was everted 180 degrees laterally and knee flexed to 90 degrees. There was minimal synovitis, surely no evidence of infection.  I did send specimens of deep synovium to the pathologist and there was no evidence of any white cells per high-powered field, no evidence of any acute inflammation.  We therefore proceeded with revision.  She has had a prior knee aspirate with a negative culture and just have normal CBC, sed rate and C-reactive protein.  The femoral and tibial components were removed using a series of osteotomes and a small saw blade.  There was very minimal bone loss.  We initially proceeded with revision of the tibial component.  All of the methacrylate was removed from the proximal tibia carefully with a small osteotome, then used hand reamers to 14 mm and on several occasions, checked to be sure that I was within bone.  We made a cleanup cut transversely in the proximal tibia using the internal guide.  Center hole was then made followed by the keel cut.  There was a small cyst extending medially approximately 5-6 mm in diameter, this was debrided of any soft tissue and it was subsequently filled with methacrylate.  A sleeve was not necessary.  The trial tibial component was then placed on the tibia, measuring 2.5 and was in excellent position.  We hand reamed to 14 to accept a 13-mm component.  Attention was then placed on the femur.  The center hole was hand reamed up to 14 mm to accept a 13-mm component.  With the center reamer in place, we then proceed to make the anterior, distal and  posterior cuts. We required a 4-mm augment laterally and distally an 8-mm medially and posterior augments of 4 mm x2.  Soft tissue was removed from all the femoral condyles.  The component did appear to be loose as there was a membrane particularly along the tibia medially and the femur medially.  The box cut was then made for the TC3 component.  We then assembled the trial femoral component with the augments and the 14-mm x 75-mm stem. This was impacted with excellent fit.  We did check our alignment with the external guides on each bony cut.  The trial tibia and femoral components were then inserted.  We trialed a number of polyethylene components and felt like the 17.5-mm component gave [place] full extension and no opening with varus or valgus stress.  The old patella was removed.  Cleanup cut was made transversely.  The patellar guide was then inserted, the 3 holes made, trial patella inserted and through a full range of motion, remained perfectly stable.  At approximately 122 minutes, tourniquet was deflated.  There was immediate capillary refill to the joint surfaces.  We then packed the wound.  During that time, we assembled the components as mentioned above. Tourniquet was down for approximately 23 minutes.  We then irrigated the wound, raised the leg and then Esmarch the leg with the proximal tourniquet at 350 mmHg.  Final components were then inserted.  We initially inserted the tibial component.  We used a gram of vancomycin in the cement.  The cement was packed proximally in the tibia and then the tibial component was then impacted.  We did find a very small crack in the tibia medially and we elected to suture this in place, and it was perfectly stable.  I used #1 Ethibond suture.  The extraneous methacrylate was removed from the periphery.  We then mixed a second batch with vancomycin used around the femur, proximally used cement and the stem did not.  This was also  impacted in place with excellent position.  I used the trial 17.5-mm polyethylene bearing and then reduced the entire construct.  Any further extraneous methacrylate was removed.  The patella was applied with methacrylate and a bone clamp.  The joint was irrigated.  At that point, we then injected the deep capsule with 0.25% Marcaine with epinephrine.  At maturation of the methacrylate, the tourniquet was again deflated at 32 minutes.  We had very nice bleeding circumferentially about the wound.  The wound was irrigated.  Small bleeders were Bovie coagulated. Medium-size Hemovac was then inserted.  I did note that there was a partial avulsion of the patellar tendon. So, we used a single screw and metallic anchor with attached 2-0 FiberWire.  Wound was again irrigated with  saline solution.  I did check to be sure there was no further extraneous methacrylate anywhere about the femur and the tibia.  The deep capsule was then closed with #1 Ethibond, superficial capsule with a running 0 Vicryl, subcu with 2-0 Vicryl and 3-0 Monocryl, skin closed with skin clips.  Sterile bulky dressing was applied followed by the patient's support stocking.  The patient tolerated the procedure without complications.     Vonna Kotyk. Durward Fortes, M.D.     PWW/MEDQ  D:  06/18/2014  T:  06/18/2014  Job:  [id number]

## 2014-06-18 NOTE — Op Note (Signed)
PATIENT ID:      Cheyenne Graham  MRN:     [id number] DOB/AGE:    1965/12/25 / 48 y.o.       OPERATIVE REPORT    DATE OF PROCEDURE:  06/18/2014       PREOPERATIVE DIAGNOSIS:   PAINFUL RIGHT TOTAL KNEE ARTHROPLASTY                                                       Estimated body mass index is 34.14 kg/(m^2) as calculated from the following:   Height as of 06/10/14: 5\' 4"  (1.626 m).   Weight as of 03/05/14: 90.266 kg (199 lb).     POSTOPERATIVE DIAGNOSIS:   PAINFUL RIGHT TOTAL KNEE ARTHROPLASTY-aseptically loose                                                                     Estimated body mass index is 34.14 kg/(m^2) as calculated from the following:   Height as of 06/10/14: 5\' 4"  (1.626 m).   Weight as of 03/05/14: 90.266 kg (199 lb).     PROCEDURE:  Procedure(s): RIGHT TOTAL KNEE REVISION-     SURGEON:  Joni Fears, MD    ASSISTANT:   Biagio Borg, PA-C   (Present and scrubbed throughout the case, critical for assistance with exposure, retraction, instrumentation, and closure.)          ANESTHESIA: regional and general     DRAINS: (right knee) Hemovact drain(s) in the clamped with  Suction Clamped :      TOURNIQUET TIME:  Total Tourniquet Time Documented: Thigh (Right) - 124 minutes Thigh (Right) - 32 minutes Total: Thigh (Right) - 156 minutes     COMPLICATIONS:  None   CONDITION:  stable  PROCEDURE IN DETAIL: [id number]    [name] 06/18/2014, 11:22 AM  -

## 2014-06-18 NOTE — Anesthesia Postprocedure Evaluation (Signed)
  Anesthesia Post-op Note  Patient: Cheyenne Graham  Procedure(s) Performed: Procedure(s): RIGHT TOTAL KNEE REVISION (Right)  Patient Location: PACU  Anesthesia Type:General and Regional  Level of Consciousness: alert   Airway and Oxygen Therapy: Patient Spontanous Breathing and Patient connected to nasal cannula oxygen  Post-op Pain: mild  Post-op Assessment: Post-op Vital signs reviewed, Patient's Cardiovascular Status Stable, Respiratory Function Stable, Patent Airway, No signs of Nausea or vomiting and Pain level controlled  Post-op Vital Signs: stable  Last Vitals:  Filed Vitals:   06/18/14 1322  BP: 122/55  Pulse: 66  Temp: 36.4 C  Resp: 12    Complications: No apparent anesthesia complications

## 2014-06-18 NOTE — Anesthesia Procedure Notes (Addendum)
Anesthesia Regional Block:  Femoral nerve block  Pre-Anesthetic Checklist: ,, timeout performed, Correct Patient, Correct Site, Correct Laterality, Correct Procedure, Correct Position, site marked, Risks and benefits discussed,  Surgical consent,  Pre-op evaluation,  At surgeon's request and post-op pain management  Laterality: Right  Prep: chloraprep       Needles:  Injection technique: Single-shot  Needle Type: Echogenic Stimulator Needle     Needle Length: 5cm 5 cm Needle Gauge: 22 and 22 G    Additional Needles:  Procedures: ultrasound guided (picture in chart) and nerve stimulator Femoral nerve block  Nerve Stimulator or Paresthesia:  Response: quadraceps contraction, 0.45 mA,   Additional Responses:   Narrative:  Start time: 06/18/2014 7:15 AM End time: 06/18/2014 7:25 AM Injection made incrementally with aspirations every 5 mL.  Performed by: Personally  Anesthesiologist: Suzette Battiest, MD  Additional Notes: Functioning IV was confirmed and monitors were applied.  A 19mm 22ga Arrow echogenic stimulator needle was used. Sterile prep, hand hygiene and sterile gloves were used. Ultrasound guidance: relevant anatomy identified, needle position confirmed, local anesthetic spread visualized around nerve(s)., vascular puncture avoided.  Image printed for medical record. Negative aspiration and negative test dose prior to incremental administration of local anesthetic. The patient tolerated the procedure well.     Procedure Name: Intubation Date/Time: 06/18/2014 7:45 AM Performed by: Ned Grace Pre-anesthesia Checklist: Patient identified, Patient being monitored, Emergency Drugs available, Timeout performed and Suction available Patient Re-evaluated:Patient Re-evaluated prior to inductionOxygen Delivery Method: Circle system utilized Preoxygenation: Pre-oxygenation with 100% oxygen Intubation Type: IV induction Ventilation: Mask ventilation without  difficulty Laryngoscope Size: Mac and 3 Grade View: Grade I Tube type: Oral Tube size: 7.0 mm Number of attempts: 1 Airway Equipment and Method: Stylet Placement Confirmation: ETT inserted through vocal cords under direct vision,  breath sounds checked- equal and bilateral and positive ETCO2 Secured at: 22 cm Tube secured with: Tape Dental Injury: Teeth and Oropharynx as per pre-operative assessment  Comments: SIVI by Dr Jenita Seashore.

## 2014-06-18 NOTE — Anesthesia Preprocedure Evaluation (Addendum)
Anesthesia Evaluation  Patient identified by MRN, date of birth, ID band Patient awake    Reviewed: Allergy & Precautions, H&P , NPO status , Patient's Chart, lab work & pertinent test results  Airway Mallampati: III TM Distance: >3 FB Neck ROM: Full    Dental  (+) Dental Advisory Given, Teeth Intact   Pulmonary neg pulmonary ROS,  breath sounds clear to auscultation        Cardiovascular negative cardio ROS  Rhythm:Regular Rate:Normal     Neuro/Psych negative neurological ROS  negative psych ROS   GI/Hepatic GERD-  ,  Endo/Other  Morbid obesity  Renal/GU      Musculoskeletal  (+) Arthritis -,   Abdominal   Peds  Hematology negative hematology ROS (+)   Anesthesia Other Findings   Reproductive/Obstetrics                          Anesthesia Physical Anesthesia Plan  ASA: II  Anesthesia Plan: General   Post-op Pain Management:    Induction: Intravenous  Airway Management Planned: LMA and Oral ETT  Additional Equipment:   Intra-op Plan:   Post-operative Plan: Extubation in OR  Informed Consent: I have reviewed the patients History and Physical, chart, labs and discussed the procedure including the risks, benefits and alternatives for the proposed anesthesia with the patient or authorized representative who has indicated his/her understanding and acceptance.   Dental advisory given  Plan Discussed with: CRNA  Anesthesia Plan Comments:        Anesthesia Quick Evaluation

## 2014-06-19 LAB — URINE CULTURE
Colony Count: NO GROWTH
Culture: NO GROWTH

## 2014-06-19 LAB — CBC
HEMATOCRIT: 29.8 % — AB (ref 36.0–46.0)
HEMOGLOBIN: 10.1 g/dL — AB (ref 12.0–15.0)
MCH: 28.8 pg (ref 26.0–34.0)
MCHC: 33.9 g/dL (ref 30.0–36.0)
MCV: 84.9 fL (ref 78.0–100.0)
Platelets: 236 10*3/uL (ref 150–400)
RBC: 3.51 MIL/uL — ABNORMAL LOW (ref 3.87–5.11)
RDW: 12.5 % (ref 11.5–15.5)
WBC: 6.8 10*3/uL (ref 4.0–10.5)

## 2014-06-19 LAB — BASIC METABOLIC PANEL
Anion gap: 11 (ref 5–15)
BUN: 11 mg/dL (ref 6–23)
CHLORIDE: 107 meq/L (ref 96–112)
CO2: 23 mEq/L (ref 19–32)
Calcium: 8.1 mg/dL — ABNORMAL LOW (ref 8.4–10.5)
Creatinine, Ser: 0.64 mg/dL (ref 0.50–1.10)
GFR calc Af Amer: >90 mL/min (ref >90)
GFR calc non Af Amer: >90 mL/min (ref >90)
GLUCOSE: 105 mg/dL — AB (ref 70–99)
POTASSIUM: 4.1 meq/L (ref 3.7–5.3)
Sodium: 141 mEq/L (ref 137–147)

## 2014-06-19 MED ORDER — CEFAZOLIN SODIUM-DEXTROSE 2-3 GM-% IV SOLR
2.0000 g | Freq: Three times a day (TID) | INTRAVENOUS | Status: DC
  Administered 2014-06-19 – 2014-06-20 (×4): 2 g via INTRAVENOUS
  Filled 2014-06-19 (×7): qty 50

## 2014-06-19 NOTE — Discharge Instructions (Signed)
Information on my medicine - XARELTO (Rivaroxaban)  This medication education was reviewed with me or my healthcare representative as part of my discharge preparation.    Why was Xarelto prescribed for you? Xarelto was prescribed for you to reduce the risk of blood clots forming after orthopedic surgery. The medical term for these abnormal blood clots is venous thromboembolism (VTE).  What do you need to know about xarelto ? Take your Xarelto 10 mg ONCE DAILY at the same time every day. You may take it either with or without food.  If you have difficulty swallowing the tablet whole, you may crush it and mix in applesauce just prior to taking your dose.  Take Xarelto exactly as prescribed by your doctor and DO NOT stop taking Xarelto without talking to the doctor who prescribed the medication.  Stopping without other VTE prevention medication to take the place of Xarelto may increase your risk of developing a clot.  After discharge, you should have regular check-up appointments with your healthcare provider that is prescribing your Xarelto.    What do you do if you miss a dose? If you miss a dose, take it as soon as you remember on the same day then continue your regularly scheduled once daily regimen the next day. Do not take two doses of Xarelto on the same day.   Important Safety Information A possible side effect of Xarelto is bleeding. You should call your healthcare provider right away if you experience any of the following:   Bleeding from an injury or your nose that does not stop.   Unusual colored urine (red or dark brown) or unusual colored stools (red or black).   Unusual bruising for unknown reasons.   A serious fall or if you hit your head (even if there is no bleeding).  Some medicines may interact with Xarelto and might increase your risk of bleeding while on Xarelto. To help avoid this, consult your healthcare provider or pharmacist prior to using any new  prescription or non-prescription medications, including herbals, vitamins, non-steroidal anti-inflammatory drugs (NSAIDs) and supplements.  This website has more information on Xarelto: [email or web address]

## 2014-06-19 NOTE — Evaluation (Signed)
Physical Therapy Evaluation Patient Details Name: Cheyenne Graham MRN: [id number] DOB: 07/10/1966 Today's Date: 06/19/2014   History of Present Illness  48 y.o. female s/p right total knee arthroplasty.  Clinical Impression  Pt is s/p right TKA resulting in the deficits listed below (see PT Problem List). Ambulates up to 25 feet this AM with Min guard while using a rolling walker, safely maintaining 50% weight bearing status on RLE. Tolerated therapeutic exercises, and educated on safety with mobility and precautions. Pt will benefit from skilled PT to increase their independence and safety with mobility to allow discharge to the venue listed below.       Follow Up Recommendations Home health PT;Supervision for mobility/OOB    Equipment Recommendations  None recommended by PT    Recommendations for Other Services OT consult     Precautions / Restrictions Precautions Precautions: Knee Precaution Booklet Issued: Yes (comment) Precaution Comments: Reviewed knee precautions Restrictions Weight Bearing Restrictions: Yes RLE Weight Bearing: Partial weight bearing RLE Partial Weight Bearing Percentage or Pounds: 50%      Mobility  Bed Mobility Overal bed mobility: Needs Assistance Bed Mobility: Supine to Sit     Supine to sit: Min assist;HOB elevated     General bed mobility comments: Min assist for RLE support out of bed. VC for technique. Use of rail with HOB elevated.  Transfers Overall transfer level: Needs assistance Equipment used: Rolling walker (2 wheeled) Transfers: Sit to/from Stand Sit to Stand: Min assist         General transfer comment: Min assist for boost to stand from lowest bed setting. VC for hand placement and to weight shift forward over non-operative knee. Grabs walker halfway up to standing position.  Ambulation/Gait Ambulation/Gait assistance: Min guard Ambulation Distance (Feet): 25 Feet Assistive device: Rolling walker (2 wheeled) Gait  Pattern/deviations: Step-to pattern;Decreased step length - right;Decreased step length - left;Decreased stance time - right;Antalgic;Decreased stride length;Trunk flexed   Gait velocity interpretation: Below normal speed for age/gender General Gait Details: Educated on safe DME use with a rolling walker. VC for sequencing and upright posture. Very slow and guarded however able to safely maintain 50% weight-bearing status on RLE throughout ambulatory bout.  Stairs            Wheelchair Mobility    Modified Rankin (Stroke Patients Only)       Balance Overall balance assessment: Needs assistance Sitting-balance support: No upper extremity supported;Feet supported Sitting balance-Leahy Scale: Fair     Standing balance support: Bilateral upper extremity supported Standing balance-Leahy Scale: Poor                               Pertinent Vitals/Pain Pain Assessment: 0-10 Pain Score: 4  Pain Location: Rt knee Pain Descriptors / Indicators: Sore Pain Intervention(s): Monitored during session;Premedicated before session;Repositioned    Home Living Family/patient expects to be discharged to:: Private residence Living Arrangements: Children;Parent Available Help at Discharge: Family;Available 24 hours/day Type of Home: House Home Access: Stairs to enter Entrance Stairs-Rails: None Entrance Stairs-Number of Steps: 1 Home Layout: Two level;Able to live on main level with bedroom/bathroom Home Equipment: Gilford Rile - 2 wheels;Bedside commode;Cane - single point (3 in 1)      Prior Function Level of Independence: Independent with assistive device(s)         Comments: cane for ambulation     Hand Dominance   Dominant Hand: Right    Extremity/Trunk Assessment  Upper Extremity Assessment: Defer to OT evaluation           Lower Extremity Assessment: RLE deficits/detail RLE Deficits / Details: Decreased strength and ROM as expected post op - very painful  with minimal movement.       Communication   Communication: No difficulties  Cognition Arousal/Alertness: Awake/alert Behavior During Therapy: WFL for tasks assessed/performed Overall Cognitive Status: Within Functional Limits for tasks assessed                      General Comments      Exercises Total Joint Exercises Ankle Circles/Pumps: AROM;Both;10 reps;Supine Quad Sets: AROM;Right;10 reps;Supine Knee Flexion: AROM;Right;5 reps;Seated      Assessment/Plan    PT Assessment Patient needs continued PT services  PT Diagnosis Difficulty walking;Abnormality of gait;Acute pain   PT Problem List Decreased strength;Decreased range of motion;Decreased activity tolerance;Decreased balance;Decreased mobility;Decreased knowledge of use of DME;Decreased knowledge of precautions;Pain  PT Treatment Interventions DME instruction;Gait training;Functional mobility training;Therapeutic activities;Therapeutic exercise;Balance training;Neuromuscular re-education;Patient/family education;Modalities;Stair training   PT Goals (Current goals can be found in the Care Plan section) Acute Rehab PT Goals Patient Stated Goal: Play basketball with my son PT Goal Formulation: With patient Time For Goal Achievement: 06/26/14 Potential to Achieve Goals: Good    Frequency 7X/week   Barriers to discharge        Co-evaluation               End of Session Equipment Utilized During Treatment: Gait belt Activity Tolerance: Patient tolerated treatment well Patient left: in chair;with call bell/phone within reach Nurse Communication: Mobility status         Time: 0912-0938 PT Time Calculation (min): 26 min   Charges:   PT Evaluation $Initial PT Evaluation Tier I: 1 Procedure PT Treatments $Therapeutic Activity: 8-22 mins   PT G Codes:        [name], [name]   [name] 06/19/2014, 9:59 AM

## 2014-06-19 NOTE — Evaluation (Signed)
Occupational Therapy Evaluation Patient Details Name: Cheyenne Graham MRN: [id number] DOB: March 27, 1966 Today's Date: 06/19/2014    History of Present Illness 48 y.o. female s/p right total knee arthroplasty.   Clinical Impression   Pt admitted with the above diagnoses and presents with below problem list. Pt will benefit from continued acute OT to address the below listed deficits and maximize independence with basic ADLs prior to d/c home with family assistance. PTA pt was mod I with ADLs using a cane for mobility. Currently pt at min A level for LB ADLs.       Follow Up Recommendations  Supervision/Assistance - 24 hour;No OT follow up    Equipment Recommendations  None recommended by OT;Other (comment) (has 3n1)    Recommendations for Other Services       Precautions / Restrictions Precautions Precautions: Knee Precaution Booklet Issued: Yes (comment) Precaution Comments: Reviewed knee precautions Restrictions Weight Bearing Restrictions: Yes RLE Weight Bearing: Partial weight bearing RLE Partial Weight Bearing Percentage or Pounds: 50%      Mobility Bed Mobility      General bed mobility comments: pt in recliner  Transfers Overall transfer level: Needs assistance Equipment used: Rolling walker (2 wheeled) Transfers: Sit to/from Stand Sit to Stand: Min assist         General transfer comment: min A to power up from recliner    Balance Overall balance assessment: Needs assistance Sitting-balance support: No upper extremity supported;Feet supported Sitting balance-Leahy Scale: Fair     Standing balance support: Bilateral upper extremity supported;During functional activity Standing balance-Leahy Scale: Poor Standing balance comment: needs rw to maintain balance                            ADL Overall ADL's : Needs assistance/impaired Eating/Feeding: Set up;Sitting   Grooming: Set up;Sitting   Upper Body Bathing: Set up;Sitting   Lower Body  Bathing: Minimal assistance;Sit to/from stand;With adaptive equipment   Upper Body Dressing : Set up;Sitting   Lower Body Dressing: Minimal assistance;With adaptive equipment;Sit to/from Archivist: Ambulation;BSC;RW;Minimal assistance   Toileting- Water quality scientist and Hygiene: Sit to/from stand;Minimal assistance   Tub/ Shower Transfer: Minimal assistance   Functional mobility during ADLs: Min guard;Rolling walker General ADL Comments: min A for LB ADLs to power up during sit>stand. Discussed home setup for toileting/showering at home.      Vision                     Perception     Praxis      Pertinent Vitals/Pain Pain Assessment: 0-10 Pain Score: 4  Pain Location: right knee Pain Descriptors / Indicators: Sore Pain Intervention(s): Monitored during session;Repositioned     Hand Dominance Right   Extremity/Trunk Assessment Upper Extremity Assessment Upper Extremity Assessment: Overall WFL for tasks assessed   Lower Extremity Assessment Lower Extremity Assessment: Defer to PT evaluation        Communication Communication Communication: No difficulties   Cognition Arousal/Alertness: Awake/alert Behavior During Therapy: WFL for tasks assessed/performed Overall Cognitive Status: Within Functional Limits for tasks assessed                     General Comments       Exercises      Shoulder Instructions      Home Living Family/patient expects to be discharged to:: Private residence Living Arrangements: Children;Parent Available Help at Discharge: Family;Available 24 hours/day Type  of Home: House Home Access: Stairs to enter [employer] of Steps: 1 Entrance Stairs-Rails: None Home Layout: Two level;Able to live on main level with bedroom/bathroom               Home Equipment: Gilford Rile - 2 wheels;Bedside commode;Cane - single point          Prior Functioning/Environment Level of Independence:  Independent with assistive device(s)        Comments: cane for ambulation    OT Diagnosis: Acute pain   OT Problem List: Impaired balance (sitting and/or standing);Decreased knowledge of use of DME or AE;Decreased knowledge of precautions;Pain   OT Treatment/Interventions: Self-care/ADL training;Therapeutic activities;Patient/family education;Balance training;DME and/or AE instruction    OT Goals(Current goals can be found in the care plan section) Acute Rehab OT Goals Patient Stated Goal: not stated OT Goal Formulation: With patient Time For Goal Achievement: 06/26/14 Potential to Achieve Goals: Good ADL Goals Pt Will Perform Lower Body Bathing: with supervision;with adaptive equipment;sit to/from stand Pt Will Perform Lower Body Dressing: with supervision;with adaptive equipment;sit to/from stand Pt Will Transfer to Toilet: with supervision;ambulating (3n1 over toilet) Pt Will Perform Toileting - Clothing Manipulation and hygiene: with supervision;sit to/from stand Pt Will Perform Tub/Shower Transfer: with supervision;ambulating;3 in 1;rolling walker  OT Frequency: Min 2X/week   Barriers to D/C:            Co-evaluation              End of Session Equipment Utilized During Treatment: Gait belt;Rolling walker CPM Right Knee CPM Right Knee: Off  Activity Tolerance: Patient tolerated treatment well Patient left: in chair;with call bell/phone within reach   Time: 1008-1028 OT Time Calculation (min): 20 min Charges:  OT General Charges $OT Visit: 1 Procedure OT Evaluation $Initial OT Evaluation Tier I: 1 Procedure OT Treatments $Self Care/Home Management : 8-22 mins G-Codes:    Hortencia Pilar 2014/07/12, 10:39 AM

## 2014-06-19 NOTE — Progress Notes (Signed)
Physical Therapy Treatment Patient Details Name: Cheyenne Graham MRN: [id number] DOB: Feb 07, 1966 Today's Date: 06/19/2014    History of Present Illness 48 y.o. female s/p right total knee arthroplasty.    PT Comments    Patient is progressing well towards physical therapy goals, ambulating up to 100 feet with min guard assist while using a rolling walker. Safely completed stair training and is tolerating therapeutic exercises well. Patient will continue to benefit from skilled physical therapy services to further improve independence with functional mobility.    Follow Up Recommendations  Home health PT;Supervision for mobility/OOB     Equipment Recommendations  None recommended by PT    Recommendations for Other Services OT consult     Precautions / Restrictions Precautions Precautions: Knee Precaution Comments: Reviewed knee precautions Restrictions Weight Bearing Restrictions: Yes RLE Weight Bearing: Partial weight bearing RLE Partial Weight Bearing Percentage or Pounds: 50%    Mobility  Bed Mobility Overal bed mobility: Needs Assistance Bed Mobility: Supine to Sit;Sit to Supine     Supine to sit: HOB elevated;Min guard Sit to supine: Min guard   General bed mobility comments: Min guard for safety to enter/exit bed. Educated to use LLE to support RLE.  Transfers Overall transfer level: Needs assistance Equipment used: Rolling walker (2 wheeled) Transfers: Sit to/from Stand Sit to Stand: Min assist         General transfer comment: Min assist for RW stability. VC for hand placement. Performed from lowest bed setting and recliner. Requires extra time.  Ambulation/Gait Ambulation/Gait assistance: Min guard Ambulation Distance (Feet): 100 Feet Assistive device: Rolling walker (2 wheeled) Gait Pattern/deviations: Step-to pattern;Decreased step length - left;Decreased stance time - right;Antalgic;Decreased stride length;Trunk flexed   Gait velocity  interpretation: Below normal speed for age/gender General Gait Details: Frequent cues for upright posture. Min guard for safety and VC for Rt knee extension in stance phase for quad activation. No instances of knee buckling. Slow and guarded but maintains 50% weight bearing status at all times.   Stairs Stairs: Yes Stairs assistance: Min assist Stair Management: No rails;Step to pattern;Backwards;With walker Number of Stairs: 1 (x2) General stair comments: Educated on safe stair navigation similar to home environment. VC for sequencing. Able to correctly teach back and demonstrate on second trail. Pt with no further questions.  Wheelchair Mobility    Modified Rankin (Stroke Patients Only)       Balance                                    Cognition Arousal/Alertness: Awake/alert Behavior During Therapy: WFL for tasks assessed/performed Overall Cognitive Status: Within Functional Limits for tasks assessed                      Exercises Total Joint Exercises Ankle Circles/Pumps: AROM;Both;10 reps;Supine Quad Sets: AROM;Right;10 reps;Supine Heel Slides: AROM;Right;10 reps;Seated Long Arc Quad: AAROM;Right;10 reps;Seated Goniometric ROM: 10-62 degrees Rt knee flexion in sitting.    General Comments        Pertinent Vitals/Pain Pain Assessment: 0-10 Pain Score: 6  Pain Location: Rt knee Pain Intervention(s): Monitored during session;Repositioned    Home Living                      Prior Function            PT Goals (current goals can now be found in the care plan section)  Acute Rehab PT Goals PT Goal Formulation: With patient Time For Goal Achievement: 06/26/14 Potential to Achieve Goals: Good Progress towards PT goals: Progressing toward goals    Frequency  7X/week    PT Plan Current plan remains appropriate    Co-evaluation             End of Session Equipment Utilized During Treatment: Gait belt Activity Tolerance:  Patient tolerated treatment well Patient left: in chair;with call bell/phone within reach     Time: 1345-1415 PT Time Calculation (min): 30 min  Charges:  $Gait Training: 8-22 mins $Therapeutic Exercise: 8-22 mins                    G Codes:      [employer], [place]  [name] 06/19/2014, 2:58 PM

## 2014-06-19 NOTE — Progress Notes (Signed)
Patient ID: Cheyenne Graham, female   DOB: [birth year], 48 y.o.   MRN: [id number] PATIENT ID: Cheyenne Graham        MRN:  [id number]          DOB/AGE: May 31, 1966 / 48 y.o.    Cheyenne Fears, MD   Cheyenne Borg, PA-C [address] [place], [place]  [address]                             [phone]   PROGRESS NOTE  Subjective:  negative for Chest Pain  negative for Shortness of Breath  negative for Nausea/Vomiting   negative for Calf Pain    Tolerating Diet: yes         Patient reports pain as mild and moderate.     Has been coughing up some mucous.  Foot hurts but denies numbness/tingling   Objective: Vital signs in last 24 hours:   Patient Vitals for the past 24 hrs:  BP Temp Temp src Pulse Resp SpO2  06/19/14 0546 98/56 mmHg 98.9 F (37.2 C) Oral 64 17 100 %  06/19/14 0325 - - - - 18 100 %  06/19/14 0000 - - - - 16 100 %  06/18/14 2355 100/57 mmHg 98.6 F (37 C) Oral 73 17 100 %  06/18/14 2047 107/64 mmHg 98.7 F (37.1 C) Oral 79 20 98 %  06/18/14 1600 - - - - 18 -  06/18/14 1322 122/55 mmHg 97.6 F (36.4 C) - 66 12 99 %  06/18/14 1300 - 98.1 F (36.7 C) - 62 20 100 %  06/18/14 1245 139/78 mmHg - - 57 10 100 %  06/18/14 1230 154/74 mmHg - - 60 11 100 %  06/18/14 1215 140/76 mmHg - - 83 20 100 %  06/18/14 1200 127/71 mmHg 97.8 F (36.6 C) - 57 11 95 %      Intake/Output from previous day:   10/13 0701 - 10/14 0700 In: 2180 [P.O.:180; I.V.:2000] Out: 7026 [Urine:3695; Drains:140]   Intake/Output this shift:       Intake/Output     10/13 0701 - 10/14 0700 10/14 0701 - 10/15 0700   P.O. 180    I.V. 2000    Total Intake 2180     Urine 3695    Drains 140    Blood 200    Total Output 4035     Net -1855             LABORATORY DATA:  Recent Labs  06/19/14 0540  WBC 6.8  HGB 10.1*  HCT 29.8*  PLT 236    Recent Labs  06/19/14 0540  NA 141  K 4.1  CL 107  CO2 23  BUN 11  CREATININE 0.64  GLUCOSE 105*  CALCIUM 8.1*   Lab Results  Component  Value Date   INR 1.04 06/10/2014   INR 0.98 02/15/2012    Recent Radiographic Studies :  No results found.   Examination:  General appearance: alert, mild distress and moderate distress Resp: clear to auscultation bilaterally Cardio: regular rate and rhythm GI: normal findings: bowel sounds normal  Wound Exam: clean, dry, intact   Drainage:  None: wound tissue dry  Motor Exam: EHL, FHL, Anterior Tibial and Posterior Tibial Intact  Sensory Exam: Superficial Peroneal, Deep Peroneal and Tibial normal  Vascular Exam: Right posterior tibial artery has 1+ (weak) pulse  Assessment:    1 Day Post-Op  Procedure(s) (LRB):  RIGHT TOTAL KNEE REVISION (Right)  ADDITIONAL DIAGNOSIS:  Active Problems:   S/P total knee replacement using cement  Acute Blood Loss Anemia asymptomatic   Plan: Physical Therapy as ordered Partial Weight Bearing @ 50% (PWB)  DVT Prophylaxis:  Xarelto, Foot Pumps and TED hose  DISCHARGE PLAN: Home  DISCHARGE NEEDS: HHPT, CPM, Walker and 3-in-1 comode seat  Plan on continuing antibiotics for 24 more hours. Hemovac pulled        [facility]  06/19/2014 7:57 AM

## 2014-06-20 LAB — CBC
HCT: 30.9 % — ABNORMAL LOW (ref 36.0–46.0)
HEMOGLOBIN: 10.6 g/dL — AB (ref 12.0–15.0)
MCH: 29.3 pg (ref 26.0–34.0)
MCHC: 34.3 g/dL (ref 30.0–36.0)
MCV: 85.4 fL (ref 78.0–100.0)
Platelets: 236 10*3/uL (ref 150–400)
RBC: 3.62 MIL/uL — ABNORMAL LOW (ref 3.87–5.11)
RDW: 12.6 % (ref 11.5–15.5)
WBC: 6.7 10*3/uL (ref 4.0–10.5)

## 2014-06-20 LAB — BASIC METABOLIC PANEL
Anion gap: 10 (ref 5–15)
BUN: 8 mg/dL (ref 6–23)
CO2: 26 meq/L (ref 19–32)
CREATININE: 0.63 mg/dL (ref 0.50–1.10)
Calcium: 8.2 mg/dL — ABNORMAL LOW (ref 8.4–10.5)
Chloride: 106 mEq/L (ref 96–112)
GFR calc Af Amer: >90 mL/min (ref >90)
GFR calc non Af Amer: >90 mL/min (ref >90)
Glucose, Bld: 100 mg/dL — ABNORMAL HIGH (ref 70–99)
Potassium: 4.4 mEq/L (ref 3.7–5.3)
Sodium: 142 mEq/L (ref 137–147)

## 2014-06-20 MED ORDER — RIVAROXABAN 10 MG PO TABS: 10.0000 mg | ORAL_TABLET | ORAL | Status: DC

## 2014-06-20 MED ORDER — OXYCODONE HCL 5 MG PO TABS: 5.0000 mg | ORAL_TABLET | ORAL | Status: DC | PRN

## 2014-06-20 MED ORDER — METHOCARBAMOL 500 MG PO TABS: 500.0000 mg | ORAL_TABLET | Freq: Four times a day (QID) | ORAL | Status: DC | PRN

## 2014-06-20 NOTE — Progress Notes (Signed)
Subjective: 2 Days Post-Op Procedure(s) (LRB): RIGHT TOTAL KNEE REVISION (Right) Patient reports pain as mild.    Objective: Vital signs in last 24 hours: Temp:  [98.3 F (36.8 C)-99.3 F (37.4 C)] 98.3 F (36.8 C) (10/15 1357) Pulse Rate:  [93-103] 103 (10/15 1357) Resp:  [16-18] 18 (10/15 1357) BP: (118-130)/(65-79) 130/79 mmHg (10/15 1357) SpO2:  [96 %-98 %] 96 % (10/15 0609)  Intake/Output from previous day: 10/14 0701 - 10/15 0700 In: 720 [P.O.:720] Out: -  Intake/Output this shift: Total I/O In: 480 [P.O.:480] Out: -    Recent Labs  06/19/14 0540 06/20/14 0525  HGB 10.1* 10.6*    Recent Labs  06/19/14 0540 06/20/14 0525  WBC 6.8 6.7  RBC 3.51* 3.62*  HCT 29.8* 30.9*  PLT 236 236    Recent Labs  06/19/14 0540 06/20/14 0525  NA 141 142  K 4.1 4.4  CL 107 106  CO2 23 26  BUN 11 8  CREATININE 0.64 0.63  GLUCOSE 105* 100*  CALCIUM 8.1* 8.2*   No results found for this basename: LABPT, INR,  in the last 72 hours  Neurologically intact, no calf pain  Assessment/Plan: 2 Days Post-Op Procedure(s) (LRB): RIGHT TOTAL KNEE REVISION (Right) Discharge home with home health, excellent progress in PT, dressing changed, lab stable, will D/C to home  [facility], Nyjah Denio W 06/20/2014, 2:15 PM

## 2014-06-20 NOTE — Progress Notes (Signed)
Physical Therapy Treatment Patient Details Name: Cheyenne Graham MRN: [id number] DOB: [birth year] Today's Date: 06/20/2014    History of Present Illness 48 y.o. female s/p right total knee arthroplasty.    PT Comments    Patient is progressing well towards physical therapy goals, ambulating up to 100 feet with a rolling walker at a supervision level. She safely maintains 50% weight-bearing status on RLE at all times during therapy session. Safely completed stair training yesterday. She will have 24 hour care at home. Feel she is adequate for d/c from a mobility standpoint when medically ready.   Follow Up Recommendations  Home health PT;Supervision for mobility/OOB     Equipment Recommendations  None recommended by PT    Recommendations for Other Services OT consult     Precautions / Restrictions Precautions Precautions: Knee Precaution Comments: Reviewed knee precautions Restrictions Weight Bearing Restrictions: Yes RLE Weight Bearing: Partial weight bearing RLE Partial Weight Bearing Percentage or Pounds: 50%    Mobility  Bed Mobility               General bed mobility comments: Pt up in recliner upon my arrival  Transfers Overall transfer level: Needs assistance Equipment used: Rolling walker (2 wheeled) Transfers: Sit to/from Stand Sit to Stand: Supervision         General transfer comment: Supervision for safety with VC for hand placement. Good stability upon standing and maintains 50% WB status on RLE.  Ambulation/Gait Ambulation/Gait assistance: Supervision Ambulation Distance (Feet): 125 Feet Assistive device: Rolling walker (2 wheeled) Gait Pattern/deviations: Step-to pattern;Decreased step length - left;Decreased stance time - right;Antalgic;Trunk flexed   Gait velocity interpretation: Below normal speed for age/gender General Gait Details: Continues to require cues for upright posture. Demonstrates good control of rolling walker. Safely maintains  50% Wb status on RLE at all times. No loss of balance. Very slow and guarded. Instructed to extend right knee in stance phase for quad activation   Stairs            Wheelchair Mobility    Modified Rankin (Stroke Patients Only)       Balance                                    Cognition Arousal/Alertness: Awake/alert Behavior During Therapy: WFL for tasks assessed/performed Overall Cognitive Status: Within Functional Limits for tasks assessed                      Exercises Total Joint Exercises Ankle Circles/Pumps: AROM;Both;10 reps;Supine Quad Sets: AROM;Right;10 reps;Supine Heel Slides: AROM;Right;10 reps;Seated Long Arc Quad: AAROM;Right;10 reps;Seated Goniometric ROM: 6-65 degrees right knee flexion in sitting    General Comments        Pertinent Vitals/Pain Pain Assessment: 0-10 Pain Score: 4  Pain Location: Rt knee Pain Descriptors / Indicators: Aching;Sore Pain Intervention(s): Monitored during session;Repositioned;Premedicated before session    Home Living                      Prior Function            PT Goals (current goals can now be found in the care plan section) Acute Rehab PT Goals PT Goal Formulation: With patient Time For Goal Achievement: 06/26/14 Potential to Achieve Goals: Good Progress towards PT goals: Progressing toward goals    Frequency  7X/week    PT Plan Current plan remains appropriate  Co-evaluation             End of Session Equipment Utilized During Treatment: Gait belt Activity Tolerance: Patient tolerated treatment well Patient left: in chair;with call bell/phone within reach     Time: 1103-1123 PT Time Calculation (min): 20 min  Charges:  $Therapeutic Exercise: 8-22 mins                    G Codes:      Cheyenne Graham, Cheyenne Graham  Cheyenne Graham 06/20/2014, 1:27 PM
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
## 2014-06-20 NOTE — Progress Notes (Signed)
Physical Therapy Treatment Patient Details Name: Cheyenne Graham MRN: [id number] DOB: 24-Jul-1966 Today's Date: 06/20/2014    History of Present Illness 48 y.o. female s/p right total knee arthroplasty.    PT Comments    Continues to progress towards PT goals, ambulating up to 135 feet this afternoon with supervision for safety while using a rolling walker and maintaining 50% weight-bearing status throughout therapy session. Feel pt is ready for d/c from a mobility standpoint when medically ready.   Follow Up Recommendations  Home health PT;Supervision for mobility/OOB     Equipment Recommendations  None recommended by PT    Recommendations for Other Services OT consult     Precautions / Restrictions Precautions Precautions: Knee Precaution Comments: Reviewed knee precautions Restrictions Weight Bearing Restrictions: Yes RLE Weight Bearing: Partial weight bearing RLE Partial Weight Bearing Percentage or Pounds: 50%    Mobility  Bed Mobility               General bed mobility comments: Pt up in recliner upon my arrival  Transfers Overall transfer level: Needs assistance Equipment used: Rolling walker (2 wheeled) Transfers: Sit to/from Stand Sit to Stand: Supervision         General transfer comment: Supervision for safety. Correctly places hands on stable surface to rise.  Ambulation/Gait Ambulation/Gait assistance: Supervision Ambulation Distance (Feet): 135 Feet Assistive device: Rolling walker (2 wheeled) Gait Pattern/deviations: Step-to pattern;Decreased step length - left;Decreased stance time - right;Antalgic;Trunk flexed   Gait velocity interpretation: Below normal speed for age/gender General Gait Details: Intermittent cues for upright posture, requiring fewer reminders this therapy session. Safely maintains weight bearing status on RLE. No loss of balance during bout.   Stairs            Wheelchair Mobility    Modified Rankin (Stroke  Patients Only)       Balance                                    Cognition Arousal/Alertness: Awake/alert Behavior During Therapy: WFL for tasks assessed/performed Overall Cognitive Status: Within Functional Limits for tasks assessed                      Exercises Total Joint Exercises Ankle Circles/Pumps: AROM;Both;10 reps;Supine Quad Sets: AROM;Right;10 reps;Supine Heel Slides: AROM;Right;10 reps;Seated Long Arc Quad: AAROM;Right;10 reps;Seated Goniometric ROM: 6-65 degrees right knee flexion in sitting    General Comments        Pertinent Vitals/Pain Pain Assessment: 0-10 Pain Score: 5  Pain Location: Rt knee Pain Descriptors / Indicators: Aching;Sore Pain Intervention(s): Monitored during session;Repositioned;RN gave pain meds during session    Home Living                      Prior Function            PT Goals (current goals can now be found in the care plan section) Acute Rehab PT Goals PT Goal Formulation: With patient Time For Goal Achievement: 06/26/14 Potential to Achieve Goals: Good Progress towards PT goals: Progressing toward goals    Frequency  7X/week    PT Plan Current plan remains appropriate    Co-evaluation             End of Session Equipment Utilized During Treatment: Gait belt Activity Tolerance: Patient tolerated treatment well Patient left: in chair;with call bell/phone within reach  Time: 2010-0712 PT Time Calculation (min): 13 min  Charges:  $Gait Training: 8-22 mins $Therapeutic Exercise: 8-22 mins                    G Codes:      [employer], [place]  [name] 06/20/2014, 2:30 PM

## 2014-06-20 NOTE — Progress Notes (Signed)
Occupational Therapy Treatment and Discharge Patient Details Name: Cheyenne Graham MRN: [id number] DOB: [birth year] Today's Date: 06/20/2014    History of present illness 48 y.o. female s/p right total knee arthroplasty.   OT comments  This 48 yo female seen today to go over BADLs and shower stall transfers. Pt will all education completed and only needs A for LBADLs otherwise is at a S level. No further OT needs, we will sign off.  Follow Up Recommendations  Supervision/Assistance - 24 hour;No OT follow up          Precautions / Restrictions Precautions Precautions: Knee Precaution Comments: Reviewed knee precautions Restrictions Weight Bearing Restrictions: Yes RLE Weight Bearing: Partial weight bearing RLE Partial Weight Bearing Percentage or Pounds: 50%       Mobility Bed Mobility    General bed mobility comments: Pt up in recliner upon my arrival  Transfers Overall transfer level: Needs assistance Equipment used: Rolling walker (2 wheeled) Transfers: Sit to/from Stand Sit to Stand: Supervision                ADL Overall ADL's : Needs assistance/impaired     Grooming: Wash/dry Radiographer, therapeutic: Supervision/safety;Ambulation;RW;Regular Toilet;Grab bars   Toileting- Clothing Manipulation and Hygiene: Supervision/safety;Sit to/from stand     Tub/Shower Transfer Details (indicate cue type and reason): I demonstrated to her how she would get in and out of her shower stall at home and she verbalized understanding   General ADL Comments: Pt reports that someone will A her with LBADLs until she can do them herself                Cognition   Behavior During Therapy: WFL for tasks assessed/performed Overall Cognitive Status: Within Functional Limits for tasks assessed                                    Pertinent Vitals/ Pain       Pain Assessment: 0-10 Pain Score: 8  Pain Location:  right knee Pain Descriptors / Indicators: Aching;Sore Pain Intervention(s): Monitored during session;Repositioned;Ice applied;Patient requesting pain meds-RN notified         Frequency Min 2X/week     Progress Toward Goals  OT Goals(current goals can now be found in the care plan section)  Progress towards OT goals:  (All education completd)     Plan Discharge plan remains appropriate       End of Session Equipment Utilized During Treatment: Rolling walker   Activity Tolerance Patient tolerated treatment well   Patient Left in chair;with call bell/phone within reach   Nurse Communication Patient requests pain meds        Time: 1478-2956 OT Time Calculation (min): 25 min  Charges: OT General Charges $OT Visit: 1 Procedure OT Treatments $Self Care/Home Management : 23-37 mins  Almon Register [phone] 06/20/2014, 1:24 PM

## 2014-06-21 LAB — BODY FLUID CULTURE: Culture: NO GROWTH

## 2014-06-21 NOTE — Care Management Note (Signed)
CARE MANAGEMENT NOTE 06/21/2014  Patient:  KAIA, DEPAOLIS   Account Number:  [id number]  Date Initiated:  06/20/2014  Documentation initiated by:  Ricki Miller  Subjective/Objective Assessment:   48 yr old female s/p right total knee revision- right total knee arthroplasty.     Action/Plan:   Patient preoperatively setup with [facility], no changes. Has rolling walker and 3in1 from previous surgery. Has support at discharge.   Anticipated DC Date:  06/20/2014   Anticipated DC Plan:  [place]  CM consult      PAC Choice  [place]   Choice offered to / List presented to:  C-1 Patient   DME arranged  CPM      DME agency  [employer]     [place] arranged  HH-2 PT      [place]   Status of service:  Completed, signed off Medicare Important Message given?   (If response is "NO", the following Medicare IM given date fields will be blank) Date Medicare IM given:   Medicare IM given by:   Date Additional Medicare IM given:   Additional Medicare IM given by:    Discharge Disposition:  [place]  Per UR Regulation:  Reviewed for med. necessity/level of care/duration of stay

## 2014-06-23 LAB — ANAEROBIC CULTURE

## 2014-06-25 ENCOUNTER — Encounter (HOSPITAL_COMMUNITY): Payer: Self-pay | Admitting: Orthopaedic Surgery

## 2014-07-18 ENCOUNTER — Ambulatory Visit: Payer: Federal, State, Local not specified - PPO | Admitting: Physical Medicine & Rehabilitation

## 2014-08-05 ENCOUNTER — Ambulatory Visit: Payer: Federal, State, Local not specified - PPO | Admitting: Physical Medicine & Rehabilitation

## 2014-08-05 ENCOUNTER — Encounter: Payer: Federal, State, Local not specified - PPO | Attending: Physical Medicine & Rehabilitation

## 2015-05-21 ENCOUNTER — Encounter: Payer: Self-pay | Admitting: Gastroenterology

## 2015-07-07 ENCOUNTER — Ambulatory Visit (AMBULATORY_SURGERY_CENTER): Payer: Self-pay

## 2015-07-07 VITALS — Ht 64.0 in | Wt 218.4 lb

## 2015-07-07 DIAGNOSIS — Z1211 Encounter for screening for malignant neoplasm of colon: Secondary | ICD-10-CM

## 2015-07-07 MED ORDER — SUPREP BOWEL PREP KIT 17.5-3.13-1.6 GM/177ML PO SOLN: 1.0000 | Freq: Once | ORAL | Status: DC

## 2015-07-07 NOTE — Progress Notes (Signed)
No allergies to eggs or soy No diet/weight loss meds No home oxygen No personal past problems with anesthesia EXCEPT had an aunt who was under general anesthesia and never woke up  [place]

## 2015-07-15 ENCOUNTER — Encounter: Payer: Self-pay | Admitting: Gastroenterology

## 2015-07-21 ENCOUNTER — Ambulatory Visit (AMBULATORY_SURGERY_CENTER): Payer: Federal, State, Local not specified - PPO | Admitting: Gastroenterology

## 2015-07-21 ENCOUNTER — Encounter: Payer: Self-pay | Admitting: Gastroenterology

## 2015-07-21 VITALS — BP 124/78 | HR 67 | Temp 97.0°F | Resp 20 | Ht 64.0 in | Wt 218.0 lb

## 2015-07-21 DIAGNOSIS — D125 Benign neoplasm of sigmoid colon: Secondary | ICD-10-CM

## 2015-07-21 DIAGNOSIS — D124 Benign neoplasm of descending colon: Secondary | ICD-10-CM

## 2015-07-21 DIAGNOSIS — Z1211 Encounter for screening for malignant neoplasm of colon: Secondary | ICD-10-CM

## 2015-07-21 DIAGNOSIS — D123 Benign neoplasm of transverse colon: Secondary | ICD-10-CM | POA: Diagnosis not present

## 2015-07-21 MED ORDER — SODIUM CHLORIDE 0.9 % IV SOLN: 500.0000 mL | INTRAVENOUS | Status: DC

## 2015-07-21 NOTE — Patient Instructions (Signed)
Discharge instructions given. Handouts on polyps and diverticulosis. Hold aspirin and all aspirin  Products for 1 week. Resume previous medications. YOU HAD AN ENDOSCOPIC PROCEDURE TODAY AT [facility]:   Refer to the procedure report that was given to you for any specific questions about what was found during the examination.  If the procedure report does not answer your questions, please call your gastroenterologist to clarify.  If you requested that your care partner not be given the details of your procedure findings, then the procedure report has been included in a sealed envelope for you to review at your convenience later.  YOU SHOULD EXPECT: Some feelings of bloating in the abdomen. Passage of more gas than usual.  Walking can help get rid of the air that was put into your GI tract during the procedure and reduce the bloating. If you had a lower endoscopy (such as a colonoscopy or flexible sigmoidoscopy) you may notice spotting of blood in your stool or on the toilet paper. If you underwent a bowel prep for your procedure, you may not have a normal bowel movement for a few days.  Please Note:  You might notice some irritation and congestion in your nose or some drainage.  This is from the oxygen used during your procedure.  There is no need for concern and it should clear up in a day or so.  SYMPTOMS TO REPORT IMMEDIATELY:   Following lower endoscopy (colonoscopy or flexible sigmoidoscopy):  Excessive amounts of blood in the stool  Significant tenderness or worsening of abdominal pains  Swelling of the abdomen that is new, acute  Fever of 100F or higher   For urgent or emergent issues, a gastroenterologist can be reached at any hour by calling [phone].   DIET: Your first meal following the procedure should be a small meal and then it is ok to progress to your normal diet. Heavy or fried foods are harder to digest and may make you feel nauseous or bloated.   Likewise, meals heavy in dairy and vegetables can increase bloating.  Drink plenty of fluids but you should avoid alcoholic beverages for 24 hours.  ACTIVITY:  You should plan to take it easy for the rest of today and you should NOT DRIVE or use heavy machinery until tomorrow (because of the sedation medicines used during the test).    FOLLOW UP: Our staff will call the number listed on your records the next business day following your procedure to check on you and address any questions or concerns that you may have regarding the information given to you following your procedure. If we do not reach you, we will leave a message.  However, if you are feeling well and you are not experiencing any problems, there is no need to return our call.  We will assume that you have returned to your regular daily activities without incident.  If any biopsies were taken you will be contacted by phone or by letter within the next 1-3 weeks.  Please call us at [phone] if you have not heard about the biopsies in 3 weeks.    SIGNATURES/CONFIDENTIALITY: You and/or your care partner have signed paperwork which will be entered into your electronic medical record.  These signatures attest to the fact that that the information above on your After Visit Summary has been reviewed and is understood.  Full responsibility of the confidentiality of this discharge information lies with you and/or your care-partner.

## 2015-07-21 NOTE — Progress Notes (Signed)
Called to room to assist during endoscopic procedure.  Patient ID and intended procedure confirmed with present staff. Received instructions for my participation in the procedure from the performing physician.  

## 2015-07-21 NOTE — Progress Notes (Signed)
A/ox3 pleased with MAC, report to Celia RN 

## 2015-07-21 NOTE — Op Note (Signed)
[facility]  [address] [place], [address]   COLONOSCOPY PROCEDURE REPORT  PATIENT: Cheyenne Graham, Cheyenne Graham  MR#: [id number] BIRTHDATE: June 02, 1966 , 49  yrs. old GENDER: female ENDOSCOPIST: Yetta Flock, MD REFERRED BY: Sandi Mariscal MD PROCEDURE DATE:  07/21/2015 PROCEDURE:   Colonoscopy, screening and Colonoscopy with snare polypectomy First Screening Colonoscopy - Avg.  risk and is 50 yrs.  old or older Yes.  Prior Negative Screening - Now for repeat screening. N/A  History of Adenoma - Now for follow-up colonoscopy & has been > or = to 3 yrs.  N/A  Polyps removed today? Yes ASA CLASS:   Class II INDICATIONS:Screening for colonic neoplasia and Colorectal Neoplasm Risk Assessment for this procedure is average risk. MEDICATIONS: Propofol 250 mg IV  DESCRIPTION OF PROCEDURE:   After the risks benefits and alternatives of the procedure were thoroughly explained, informed consent was obtained.  The digital rectal exam revealed no abnormalities of the rectum.   The LB [id number]  endoscope was introduced through the anus and advanced to the cecum, which was identified by both the appendix and ileocecal valve. No adverse events experienced.   The quality of the prep was good.  The instrument was then slowly withdrawn as the colon was fully examined. Estimated blood loss is zero unless otherwise noted in this procedure report.  COLON FINDINGS: A sessile polyp measuring 5 mm in size was found at the hepatic flexure.  A polypectomy was performed with a cold snare.  The resection was complete, the polyp tissue was completely retrieved and sent to histology.   A flat polyp measuring 4 mm in size was found in the sigmoid colon.  A polypectomy was performed with a cold snare.  The resection was complete, the polyp tissue was completely retrieved and sent to histology.   There was mild diverticulosis noted in the sigmoid colon.   The examination was otherwise normal.   Retroflexed views revealed no abnormalities. The time to cecum = 3.1 Withdrawal time = 12.1   The scope was withdrawn and the procedure completed. COMPLICATIONS: There were no immediate complications.  ENDOSCOPIC IMPRESSION: 1.   Sessile polyp was found at the hepatic flexure; polypectomy was performed with a cold snare 2.   Flat polyp was found in the sigmoid colon; polypectomy was performed with a cold snare 3.   Mild diverticulosis was noted in the sigmoid colon 4.   The examination was otherwise normal  RECOMMENDATIONS: 1.  Hold aspirin, aspirin products, and anti-inflammatory medication for 1 week. 2.  Await pathology results 3.  Resume diet 4.  Resume medications  eSigned:  Yetta Flock, MD 07/21/2015 2:17 PM cc: Sandi Mariscal MD, the patient

## 2015-07-22 ENCOUNTER — Telehealth: Payer: Self-pay | Admitting: *Deleted

## 2015-07-22 NOTE — Telephone Encounter (Signed)
  Follow up Call-  Call back number 07/21/2015  Post procedure Call Back phone  # [phone]  Permission to leave phone message Yes     Patient questions:  "The number you are trying to call is not reachable."

## 2015-07-29 ENCOUNTER — Encounter: Payer: Self-pay | Admitting: Gastroenterology

## 2015-10-15 ENCOUNTER — Emergency Department (HOSPITAL_COMMUNITY)
Admission: EM | Admit: 2015-10-15 | Discharge: 2015-10-15 | Disposition: A | Discharge status: Home / Self Care | Payer: Federal, State, Local not specified - PPO | Attending: Emergency Medicine | Admitting: Emergency Medicine

## 2015-10-15 ENCOUNTER — Emergency Department (INDEPENDENT_AMBULATORY_CARE_PROVIDER_SITE_OTHER)
Admission: EM | Admit: 2015-10-15 | Discharge: 2015-10-15 | Disposition: A | Discharge status: Nursing Home (Includes ICF) | Payer: Self-pay | Source: Home / Self Care | Attending: Emergency Medicine | Admitting: Emergency Medicine

## 2015-10-15 ENCOUNTER — Encounter (HOSPITAL_COMMUNITY): Payer: Self-pay | Admitting: Emergency Medicine

## 2015-10-15 ENCOUNTER — Encounter (HOSPITAL_COMMUNITY): Payer: Self-pay | Admitting: *Deleted

## 2015-10-15 ENCOUNTER — Emergency Department (HOSPITAL_COMMUNITY): Payer: Federal, State, Local not specified - PPO

## 2015-10-15 DIAGNOSIS — S39012A Strain of muscle, fascia and tendon of lower back, initial encounter: Secondary | ICD-10-CM

## 2015-10-15 DIAGNOSIS — M791 Myalgia, unspecified site: Secondary | ICD-10-CM

## 2015-10-15 DIAGNOSIS — Z8619 Personal history of other infectious and parasitic diseases: Secondary | ICD-10-CM | POA: Diagnosis not present

## 2015-10-15 DIAGNOSIS — I10 Essential (primary) hypertension: Secondary | ICD-10-CM | POA: Diagnosis not present

## 2015-10-15 DIAGNOSIS — Z8701 Personal history of pneumonia (recurrent): Secondary | ICD-10-CM | POA: Insufficient documentation

## 2015-10-15 DIAGNOSIS — K219 Gastro-esophageal reflux disease without esophagitis: Secondary | ICD-10-CM | POA: Insufficient documentation

## 2015-10-15 DIAGNOSIS — Y998 Other external cause status: Secondary | ICD-10-CM | POA: Insufficient documentation

## 2015-10-15 DIAGNOSIS — S29001A Unspecified injury of muscle and tendon of front wall of thorax, initial encounter: Secondary | ICD-10-CM | POA: Insufficient documentation

## 2015-10-15 DIAGNOSIS — S3992XA Unspecified injury of lower back, initial encounter: Secondary | ICD-10-CM | POA: Insufficient documentation

## 2015-10-15 DIAGNOSIS — S29002A Unspecified injury of muscle and tendon of back wall of thorax, initial encounter: Secondary | ICD-10-CM | POA: Insufficient documentation

## 2015-10-15 DIAGNOSIS — M1990 Unspecified osteoarthritis, unspecified site: Secondary | ICD-10-CM | POA: Insufficient documentation

## 2015-10-15 DIAGNOSIS — Y9389 Activity, other specified: Secondary | ICD-10-CM | POA: Diagnosis not present

## 2015-10-15 DIAGNOSIS — Y92410 Unspecified street and highway as the place of occurrence of the external cause: Secondary | ICD-10-CM | POA: Insufficient documentation

## 2015-10-15 DIAGNOSIS — R112 Nausea with vomiting, unspecified: Secondary | ICD-10-CM

## 2015-10-15 DIAGNOSIS — S060X0A Concussion without loss of consciousness, initial encounter: Secondary | ICD-10-CM | POA: Diagnosis not present

## 2015-10-15 DIAGNOSIS — S4991XA Unspecified injury of right shoulder and upper arm, initial encounter: Secondary | ICD-10-CM | POA: Insufficient documentation

## 2015-10-15 DIAGNOSIS — Z79899 Other long term (current) drug therapy: Secondary | ICD-10-CM | POA: Diagnosis not present

## 2015-10-15 DIAGNOSIS — G43909 Migraine, unspecified, not intractable, without status migrainosus: Secondary | ICD-10-CM | POA: Insufficient documentation

## 2015-10-15 DIAGNOSIS — S134XXA Sprain of ligaments of cervical spine, initial encounter: Secondary | ICD-10-CM

## 2015-10-15 DIAGNOSIS — M542 Cervicalgia: Secondary | ICD-10-CM

## 2015-10-15 DIAGNOSIS — M546 Pain in thoracic spine: Secondary | ICD-10-CM

## 2015-10-15 DIAGNOSIS — S0990XA Unspecified injury of head, initial encounter: Secondary | ICD-10-CM | POA: Diagnosis present

## 2015-10-15 DIAGNOSIS — G44319 Acute post-traumatic headache, not intractable: Secondary | ICD-10-CM

## 2015-10-15 MED ORDER — KETOROLAC TROMETHAMINE 60 MG/2ML IM SOLN
INTRAMUSCULAR | Status: AC
  Filled 2015-10-15: qty 2

## 2015-10-15 MED ORDER — IBUPROFEN 800 MG PO TABS: 800.0000 mg | ORAL_TABLET | Freq: Three times a day (TID) | ORAL | Status: DC | PRN

## 2015-10-15 MED ORDER — ONDANSETRON 4 MG PO TBDP
ORAL_TABLET | ORAL | Status: AC
  Filled 2015-10-15: qty 1

## 2015-10-15 MED ORDER — ONDANSETRON 4 MG PO TBDP
4.0000 mg | ORAL_TABLET | Freq: Once | ORAL | Status: AC
  Administered 2015-10-15: 4 mg via ORAL

## 2015-10-15 MED ORDER — KETOROLAC TROMETHAMINE 60 MG/2ML IM SOLN
60.0000 mg | Freq: Once | INTRAMUSCULAR | Status: AC
  Administered 2015-10-15: 60 mg via INTRAMUSCULAR

## 2015-10-15 MED ORDER — METHOCARBAMOL 500 MG PO TABS: 500.0000 mg | ORAL_TABLET | Freq: Four times a day (QID) | ORAL | Status: DC | PRN

## 2015-10-15 NOTE — ED Notes (Signed)
Patient transported to CT 
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
## 2015-10-15 NOTE — ED Notes (Signed)
Patient transported to X-ray 

## 2015-10-15 NOTE — ED Notes (Deleted)
Mother concerned for ear infection.  Symptoms for one week.

## 2015-10-15 NOTE — ED Notes (Signed)
Bowie PA at bedside 

## 2015-10-15 NOTE — Discharge Instructions (Signed)
Cervical Sprain °A cervical sprain is an injury in the neck in which the strong, fibrous tissues (ligaments) that connect your neck bones stretch or tear. Cervical sprains can range from mild to severe. Severe cervical sprains can cause the neck vertebrae to be unstable. This can lead to damage of the spinal cord and can result in serious nervous system problems. The amount of time it takes for a cervical sprain to get better depends on the cause and extent of the injury. Most cervical sprains heal in 1 to 3 weeks. °CAUSES  °Severe cervical sprains may be caused by:  °· Contact sport injuries (such as from football, rugby, wrestling, hockey, auto racing, gymnastics, diving, martial arts, or boxing).   °· Motor vehicle collisions.   °· Whiplash injuries. This is an injury from a sudden forward and backward whipping movement of the head and neck.  °· Falls.   °Mild cervical sprains may be caused by:  °· Being in an awkward position, such as while cradling a telephone between your ear and shoulder.   °· Sitting in a chair that does not offer proper support.   °· Working at a poorly designed computer station.   °· Looking up or down for long periods of time.   °SYMPTOMS  °· Pain, soreness, stiffness, or a burning sensation in the front, back, or sides of the neck. This discomfort may develop immediately after the injury or slowly, 24 hours or more after the injury.   °· Pain or tenderness directly in the middle of the back of the neck.   °· Shoulder or upper back pain.   °· Limited ability to move the neck.   °· Headache.   °· Dizziness.   °· Weakness, numbness, or tingling in the hands or arms.   °· Muscle spasms.   °· Difficulty swallowing or chewing.   °· Tenderness and swelling of the neck.   °DIAGNOSIS  °Most of the time your health care provider can diagnose a cervical sprain by taking your history and doing a physical exam. Your health care provider will ask about previous neck injuries and any known neck  problems, such as arthritis in the neck. X-rays may be taken to find out if there are any other problems, such as with the bones of the neck. Other tests, such as a CT scan or MRI, may also be needed.  °TREATMENT  °Treatment depends on the severity of the cervical sprain. Mild sprains can be treated with rest, keeping the neck in place (immobilization), and pain medicines. Severe cervical sprains are immediately immobilized. Further treatment is done to help with pain, muscle spasms, and other symptoms and may include: °· Medicines, such as pain relievers, numbing medicines, or muscle relaxants.   °· Physical therapy. This may involve stretching exercises, strengthening exercises, and posture training. Exercises and improved posture can help stabilize the neck, strengthen muscles, and help stop symptoms from returning.   °HOME CARE INSTRUCTIONS  °· Put ice on the injured area.   °¨ Put ice in a plastic bag.   °¨ Place a towel between your skin and the bag.   °¨ Leave the ice on for 15-20 minutes, 3-4 times a day.   °· If your injury was severe, you may have been given a cervical collar to wear. A cervical collar is a two-piece collar designed to keep your neck from moving while it heals. °¨ Do not remove the collar unless instructed by your health care provider. °¨ If you have long hair, keep it outside of the collar. °¨ Ask your health care provider before making any adjustments to your collar. Minor   Do not remove the collar unless instructed by your health care provider.    If you have long hair, keep it outside of the collar.    Ask your health care provider before making any adjustments to your collar. Minor adjustments may be required over time to improve comfort and reduce pressure on your chin or on the back of your head.    Ifyou are allowed to remove the collar for cleaning or bathing, follow your health care provider's instructions on how to do so safely.    Keep your collar clean by wiping it with mild soap and water and drying it completely. If the collar you have been given includes removable pads, remove them every 1-2 days and hand wash them with soap and water. Allow them to air dry. They should be completely  dry before you wear them in the collar.    If you are allowed to remove the collar for cleaning and bathing, wash and dry the skin of your neck. Check your skin for irritation or sores. If you see any, tell your health care provider.    Do not drive while wearing the collar.    Only take over-the-counter or prescription medicines for pain, discomfort, or fever as directed by your health care provider.    Keep all follow-up appointments as directed by your health care provider.    Keep all physical therapy appointments as directed by your health care provider.    Make any needed adjustments to your workstation to promote good posture.    Avoid positions and activities that make your symptoms worse.    Warm up and stretch before being active to help prevent problems.   SEEK MEDICAL CARE IF:    Your pain is not controlled with medicine.    You are unable to decrease your pain medicine over time as planned.    Your activity level is not improving as expected.   SEEK IMMEDIATE MEDICAL CARE IF:    You develop any bleeding.   You develop stomach upset.   You have signs of an allergic reaction to your medicine.    Your symptoms get worse.    You develop new, unexplained symptoms.    You have numbness, tingling, weakness, or paralysis in any part of your body.   MAKE SURE YOU:    Understand these instructions.   Will watch your condition.   Will get help right away if you are not doing well or get worse.     This information is not intended to replace advice given to you by your health care provider. Make sure you discuss any questions you have with your health care provider.     Document Released: 06/20/2007 Document Revised: 08/28/2013 Document Reviewed: 02/28/2013  Elsevier Interactive Patient Education 2016 [employer]  Concussion, Adult  A concussion, or closed-head injury, is a brain injury caused by a direct blow to the head or by a quick and sudden movement (jolt) of the head or  neck. Concussions are usually not life-threatening. Even so, the effects of a concussion can be serious. If you have had a concussion before, you are more likely to experience concussion-like symptoms after a direct blow to the head.   CAUSES   Direct blow to the head, such as from running into another player during a soccer game, being hit in a fight, or hitting your head on a hard surface.   A jolt of the head or neck that causes the brain   to move back and forth inside the skull, such as in a car crash.  SIGNS AND SYMPTOMS  The signs of a concussion can be hard to notice. Early on, they may be missed by you, family members, and health care providers. You may look fine but act or feel differently.  Symptoms are usually temporary, but they may last for days, weeks, or even longer. Some symptoms may appear right away while others may not show up for hours or days. Every head injury is different. Symptoms include:   Mild to moderate headaches that will not go away.   A feeling of pressure inside your head.   Having more trouble than usual:   Learning or remembering things you have heard.   Answering questions.   Paying attention or concentrating.   Organizing daily tasks.   Making decisions and solving problems.   Slowness in thinking, acting or reacting, speaking, or reading.   Getting lost or being easily confused.   Feeling tired all the time or lacking energy (fatigued).   Feeling drowsy.   Sleep disturbances.   Sleeping more than usual.   Sleeping less than usual.   Trouble falling asleep.   Trouble sleeping (insomnia).   Loss of balance or feeling lightheaded or dizzy.   Nausea or vomiting.   Numbness or tingling.   Increased sensitivity to:   Sounds.   Lights.   Distractions.   Vision problems or eyes that tire easily.   Diminished sense of taste or smell.   Ringing in the ears.   Mood changes such as feeling sad or anxious.   Becoming easily irritated or angry for little or no  reason.   Lack of motivation.   Seeing or hearing things other people do not see or hear (hallucinations).  DIAGNOSIS  Your health care provider can usually diagnose a concussion based on a description of your injury and symptoms. He or she will ask whether you passed out (lost consciousness) and whether you are having trouble remembering events that happened right before and during your injury.  Your evaluation might include:   A brain scan to look for signs of injury to the brain. Even if the test shows no injury, you may still have a concussion.   Blood tests to be sure other problems are not present.  TREATMENT   Concussions are usually treated in an emergency department, in urgent care, or at a clinic. You may need to stay in the hospital overnight for further treatment.   Tell your health care provider if you are taking any medicines, including prescription medicines, over-the-counter medicines, and natural remedies. Some medicines, such as blood thinners (anticoagulants) and aspirin, may increase the chance of complications. Also tell your health care provider whether you have had alcohol or are taking illegal drugs. This information may affect treatment.   Your health care provider will send you home with important instructions to follow.   How fast you will recover from a concussion depends on many factors. These factors include how severe your concussion is, what part of your brain was injured, your age, and how healthy you were before the concussion.   Most people with mild injuries recover fully. Recovery can take time. In general, recovery is slower in older persons. Also, persons who have had a concussion in the past or have other medical problems may find that it takes longer to recover from their current injury.  HOME CARE INSTRUCTIONS  General Instructions   Carefully follow   the directions your health care provider gave you.   Only take over-the-counter or prescription medicines for pain,  discomfort, or fever as directed by your health care provider.   Take only those medicines that your health care provider has approved.   Do not drink alcohol until your health care provider says you are well enough to do so. Alcohol and certain other drugs may slow your recovery and can put you at risk of further injury.   If it is harder than usual to remember things, write them down.   If you are easily distracted, try to do one thing at a time. For example, do not try to watch TV while fixing dinner.   Talk with family members or close friends when making important decisions.   Keep all follow-up appointments. Repeated evaluation of your symptoms is recommended for your recovery.   Watch your symptoms and tell others to do the same. Complications sometimes occur after a concussion. Older adults with a brain injury may have a higher risk of serious complications, such as a blood clot on the brain.   Tell your teachers, school nurse, school counselor, coach, athletic trainer, or work manager about your injury, symptoms, and restrictions. Tell them about what you can or cannot do. They should watch for:    Increased problems with attention or concentration.    Increased difficulty remembering or learning new information.    Increased time needed to complete tasks or assignments.    Increased irritability or decreased ability to cope with stress.    Increased symptoms.   Rest. Rest helps the brain to heal. Make sure you:    Get plenty of sleep at night. Avoid staying up late at night.    Keep the same bedtime hours on weekends and weekdays.    Rest during the day. Take daytime naps or rest breaks when you feel tired.   Limit activities that require a lot of thought or concentration. These include:    Doing homework or job-related work.    Watching TV.    Working on the computer.   Avoid any situation where there is potential for another head injury (football, hockey, soccer, basketball, martial arts,  downhill snow sports and horseback riding). Your condition will get worse every time you experience a concussion. You should avoid these activities until you are evaluated by the appropriate follow-up health care providers.  Returning To Your Regular Activities  You will need to return to your normal activities slowly, not all at once. You must give your body and brain enough time for recovery.   Do not return to sports or other athletic activities until your health care provider tells you it is safe to do so.   Ask your health care provider when you can drive, ride a bicycle, or operate heavy machinery. Your ability to react may be slower after a brain injury. Never do these activities if you are dizzy.   Ask your health care provider about when you can return to work or school.  Preventing Another Concussion  It is very important to avoid another brain injury, especially before you have recovered. In rare cases, another injury can lead to permanent brain damage, brain swelling, or death. The risk of this is greatest during the first 7-10 days after a head injury. Avoid injuries by:   Wearing a seat belt when riding in a car.   Drinking alcohol only in moderation.   Wearing a helmet when biking, skiing, skateboarding,   skating, or doing similar activities.   Avoiding activities that could lead to a second concussion, such as contact or recreational sports, until your health care provider says it is okay.   Taking safety measures in your home.   Remove clutter and tripping hazards from floors and stairways.   Use grab bars in bathrooms and handrails by stairs.   Place non-slip mats on floors and in bathtubs.   Improve lighting in dim areas.  SEEK MEDICAL CARE IF:   You have increased problems paying attention or concentrating.   You have increased difficulty remembering or learning new information.   You need more time to complete tasks or assignments than before.   You have increased irritability or  decreased ability to cope with stress.   You have more symptoms than before.  Seek medical care if you have any of the following symptoms for more than 2 weeks after your injury:   Lasting (chronic) headaches.   Dizziness or balance problems.   Nausea.   Vision problems.   Increased sensitivity to noise or light.   Depression or mood swings.   Anxiety or irritability.   Memory problems.   Difficulty concentrating or paying attention.   Sleep problems.   Feeling tired all the time.  SEEK IMMEDIATE MEDICAL CARE IF:   You have severe or worsening headaches. These may be a sign of a blood clot in the brain.   You have weakness (even if only in one hand, leg, or part of the face).   You have numbness.   You have decreased coordination.   You vomit repeatedly.   You have increased sleepiness.   One pupil is larger than the other.   You have convulsions.   You have slurred speech.   You have increased confusion. This may be a sign of a blood clot in the brain.   You have increased restlessness, agitation, or irritability.   You are unable to recognize people or places.   You have neck pain.   It is difficult to wake you up.   You have unusual behavior changes.   You lose consciousness.  MAKE SURE YOU:   Understand these instructions.   Will watch your condition.   Will get help right away if you are not doing well or get worse.     This information is not intended to replace advice given to you by your health care provider. Make sure you discuss any questions you have with your health care provider.     Document Released: 11/13/2003 Document Revised: 09/13/2014 Document Reviewed: 03/15/2013  [employer]vier Interactive Patient Education 2016 [employer]
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
## 2015-10-15 NOTE — ED Notes (Signed)
Pt states that she is having pain in her neck that radiates to both shoulders.

## 2016-06-16 ENCOUNTER — Encounter: Payer: Self-pay | Admitting: *Deleted

## 2016-07-08 ENCOUNTER — Ambulatory Visit (INDEPENDENT_AMBULATORY_CARE_PROVIDER_SITE_OTHER): Payer: Self-pay

## 2016-07-08 ENCOUNTER — Ambulatory Visit (INDEPENDENT_AMBULATORY_CARE_PROVIDER_SITE_OTHER): Payer: Federal, State, Local not specified - PPO | Admitting: Orthopedic Surgery

## 2016-07-08 ENCOUNTER — Encounter (INDEPENDENT_AMBULATORY_CARE_PROVIDER_SITE_OTHER): Payer: Self-pay | Admitting: Orthopedic Surgery

## 2016-07-08 VITALS — BP 143/93 | HR 72 | Resp 14 | Ht 64.0 in | Wt 198.0 lb

## 2016-07-08 DIAGNOSIS — M25561 Pain in right knee: Secondary | ICD-10-CM | POA: Diagnosis not present
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
## 2016-07-14 ENCOUNTER — Telehealth (INDEPENDENT_AMBULATORY_CARE_PROVIDER_SITE_OTHER): Payer: Self-pay | Admitting: *Deleted

## 2016-07-14 ENCOUNTER — Ambulatory Visit (HOSPITAL_COMMUNITY)
Admission: RE | Admit: 2016-07-14 | Discharge: 2016-07-14 | Disposition: A | Discharge status: Home / Self Care | Payer: Federal, State, Local not specified - PPO | Source: Ambulatory Visit | Attending: Orthopedic Surgery | Admitting: Orthopedic Surgery

## 2016-07-14 DIAGNOSIS — M25561 Pain in right knee: Secondary | ICD-10-CM | POA: Diagnosis not present

## 2016-07-14 NOTE — Progress Notes (Signed)
Preliminary results by tech - Venous Duplex Lower Ext. Right Completed. Negative for deep and superficial vein thrombosis in the right leg. Results given to [place], [place]. Oda Cogan, BS, RDMS, RVT

## 2016-07-14 NOTE — Telephone Encounter (Signed)
Pt has appt today 07/14/16 at [facility] hospital at 400p, pt is to arrive at 330p. Check in at Admissions and they will have someone to come get her. Pending call back from pt.

## 2016-07-14 NOTE — Telephone Encounter (Signed)
Called pt and aware of apt

## 2016-07-14 NOTE — Addendum Note (Signed)
Addended by: Daylene Posey T on: 07/14/2016 09:55 AM   Modules accepted: Orders

## 2016-07-16 ENCOUNTER — Encounter (INDEPENDENT_AMBULATORY_CARE_PROVIDER_SITE_OTHER): Payer: Self-pay | Admitting: Orthopaedic Surgery

## 2016-07-16 ENCOUNTER — Ambulatory Visit (INDEPENDENT_AMBULATORY_CARE_PROVIDER_SITE_OTHER): Payer: Federal, State, Local not specified - PPO | Admitting: Orthopaedic Surgery

## 2016-07-16 VITALS — BP 119/81 | HR 84 | Ht 63.0 in | Wt 198.0 lb

## 2016-07-16 DIAGNOSIS — M25561 Pain in right knee: Secondary | ICD-10-CM

## 2016-07-16 DIAGNOSIS — G8929 Other chronic pain: Secondary | ICD-10-CM
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
## 2016-07-17 LAB — URIC ACID: Uric Acid, Serum: 4.3 mg/dL (ref 2.5–7.0)

## 2016-07-19 LAB — ANGIOTENSIN CONVERTING ENZYME: Angiotensin-Converting Enzyme: 22 U/L (ref 9–67)

## 2016-07-22 ENCOUNTER — Other Ambulatory Visit: Payer: Self-pay | Admitting: Internal Medicine

## 2016-07-22 ENCOUNTER — Encounter (INDEPENDENT_AMBULATORY_CARE_PROVIDER_SITE_OTHER): Payer: Self-pay | Admitting: *Deleted

## 2016-07-22 DIAGNOSIS — N644 Mastodynia: Secondary | ICD-10-CM

## 2016-07-24 LAB — HLA-B27 ANTIGEN: DNA Result:: NEGATIVE

## 2016-07-30 ENCOUNTER — Ambulatory Visit (HOSPITAL_COMMUNITY)
Admission: RE | Admit: 2016-07-30 | Discharge: 2016-07-30 | Disposition: A | Discharge status: Home / Self Care | Payer: Federal, State, Local not specified - PPO | Source: Ambulatory Visit | Attending: Orthopaedic Surgery | Admitting: Orthopaedic Surgery

## 2016-07-30 DIAGNOSIS — G8929 Other chronic pain: Secondary | ICD-10-CM | POA: Diagnosis not present

## 2016-07-30 DIAGNOSIS — M25561 Pain in right knee: Secondary | ICD-10-CM | POA: Diagnosis not present

## 2016-08-02 ENCOUNTER — Ambulatory Visit
Admission: RE | Admit: 2016-08-02 | Discharge: 2016-08-02 | Disposition: A | Discharge status: Home / Self Care | Payer: Federal, State, Local not specified - PPO | Source: Ambulatory Visit | Attending: Internal Medicine | Admitting: Internal Medicine

## 2016-08-02 DIAGNOSIS — N644 Mastodynia: Secondary | ICD-10-CM

## 2016-08-16 ENCOUNTER — Ambulatory Visit (INDEPENDENT_AMBULATORY_CARE_PROVIDER_SITE_OTHER): Payer: Federal, State, Local not specified - PPO | Admitting: Orthopaedic Surgery

## 2016-08-16 ENCOUNTER — Encounter (INDEPENDENT_AMBULATORY_CARE_PROVIDER_SITE_OTHER): Payer: Self-pay | Admitting: Orthopaedic Surgery

## 2016-08-16 VITALS — BP 119/81 | HR 76 | Ht 63.0 in | Wt 198.0 lb

## 2016-08-16 DIAGNOSIS — G8929 Other chronic pain: Secondary | ICD-10-CM | POA: Diagnosis not present

## 2016-08-16 DIAGNOSIS — M25562 Pain in left knee: Secondary | ICD-10-CM

## 2016-08-16 NOTE — Progress Notes (Signed)
Office Visit Note   Patient: Cheyenne Graham           Date of Birth: 04-13-1966           MRN: [id number] Visit Date: 08/16/2016              Requested by: Sandi Mariscal, MD [place], [place] [address] PCP: Sandi Mariscal, MD   Assessment & Plan: Visit Diagnoses: Painful revised right total knee replacement without evidence of abnormality per MRI scan. Cheyenne Graham is improving in terms of her right knee and will continue working with her exercises.  Plan: We'll give her a note to return to work Friday, December 15 4 hours a day full duty. We will also make an appointment for her to be seen by Dr. Estanislado Pandy for her arthritic symptoms. I will see her in 3 months.  Follow-Up Instructions: No Follow-up on file.   Orders:  No orders of the defined types were placed in this encounter.  No orders of the defined types were placed in this encounter.     Procedures: No procedures performed   Clinical Data: No additional findings.   Subjective: No chief complaint on file.   Pt here today to go over MRI results of Right knee  Cheyenne Graham is had increased right knee pain after injury recently described in prior office notes. She has had some difficulty extending her knee. There was a concern that she may have had a partial tear of the quadriceps tendon. An MRI scan was scheduled and demonstrated no evidence of abnormality about the knee replacement or quadriceps tendon.  Review of Systems   Objective: Vital Signs: LMP 09/14/2015   Physical Exam  Ortho Exam examination of the right knee  demonstrates no effusion. She is actively able to extend the knee completely. There is no instability with varus or valgus stress. Skin is intact. She relates feeling much better in terms of pain along the distal quadriceps I do not feel a gap. No specialty comments available.  Imaging: No results found.   PMFS History: Patient Active Problem List   Diagnosis Date Noted  . S/P total knee  replacement using cement 06/18/2014  . Other chronic postoperative pain 01/25/2014  . Osteoarthritis of left knee 01/25/2014  . Postoperative anemia due to acute blood loss 02/24/2012  . Obesity (BMI 30-39.9) 02/24/2012  . Osteoarthritis of knee 02/09/2012   Past Medical History:  Diagnosis Date  . Arthritis   . Constipation   . Family history of anesthesia complication    ?  Aunt did not wake up.  Cheyenne Graham not sure where she had surgery,l  . GERD (gastroesophageal reflux disease)    decreased since she is not taking advail  . Herpes simplex   . Hypertension    started on BP medication last Wednesday per PCP  . Migraine     2 a week  . Migraine   . Pneumonia         Family History  Problem Relation Age of Onset  . Hypertension Mother   . Cancer Mother   . Hypertension Father   . Asthma Sister   . Migraines Sister   . Colon cancer Neg Hx     Past Surgical History:  Procedure Laterality Date  . Fatty tumor    . KNEE ARTHROSCOPY Right   . KNEE SURGERY  02/22/2012  . KNEE SURGERY Right 06/18/2014  . TOTAL KNEE ARTHROPLASTY  02/22/2012   Procedure: TOTAL KNEE ARTHROPLASTY;  Surgeon: Garald Balding, MD;  Location: [place];  Service: Orthopedics;  Laterality: Right;  . TOTAL KNEE REVISION Right 06/18/2014   Procedure: RIGHT TOTAL KNEE REVISION;  Surgeon: Garald Balding, MD;  Location: [facility];  Service: Orthopedics;  Laterality: Right;  . TUBAL LIGATION     Social History   Occupational History  . Not on file.   Social History Main Topics  . Smoking status: Never Smoker  . Smokeless tobacco: Never Used  . Alcohol use 0.0 oz/week     Comment: occasional  . Drug use: No  . Sexual activity: Yes    Birth control/ protection: Surgical

## 2016-09-08 ENCOUNTER — Ambulatory Visit (INDEPENDENT_AMBULATORY_CARE_PROVIDER_SITE_OTHER): Payer: Federal, State, Local not specified - PPO | Admitting: Orthopedic Surgery

## 2016-09-10 ENCOUNTER — Ambulatory Visit (INDEPENDENT_AMBULATORY_CARE_PROVIDER_SITE_OTHER): Payer: Federal, State, Local not specified - PPO | Admitting: Orthopaedic Surgery

## 2016-09-15 ENCOUNTER — Encounter (INDEPENDENT_AMBULATORY_CARE_PROVIDER_SITE_OTHER): Payer: Self-pay | Admitting: Orthopedic Surgery

## 2016-09-15 ENCOUNTER — Ambulatory Visit (INDEPENDENT_AMBULATORY_CARE_PROVIDER_SITE_OTHER): Payer: Federal, State, Local not specified - PPO | Admitting: Orthopedic Surgery

## 2016-09-15 VITALS — BP 118/74 | HR 92 | Resp 14 | Ht 65.0 in | Wt 198.0 lb

## 2016-09-15 DIAGNOSIS — G8929 Other chronic pain: Secondary | ICD-10-CM

## 2016-09-15 DIAGNOSIS — M25561 Pain in right knee: Secondary | ICD-10-CM

## 2016-09-15 DIAGNOSIS — M1712 Unilateral primary osteoarthritis, left knee: Secondary | ICD-10-CM | POA: Diagnosis not present

## 2016-09-15 MED ORDER — METHYLPREDNISOLONE ACETATE 40 MG/ML IJ SUSP
80.0000 mg | INTRAMUSCULAR | Status: AC | PRN
  Administered 2016-09-15: 80 mg

## 2016-09-15 MED ORDER — BUPIVACAINE HCL 0.5 % IJ SOLN
3.0000 mL | INTRAMUSCULAR | Status: AC | PRN
  Administered 2016-09-15: 3 mL via INTRA_ARTICULAR

## 2016-09-15 MED ORDER — LIDOCAINE HCL 1 % IJ SOLN
5.0000 mL | INTRAMUSCULAR | Status: AC | PRN
  Administered 2016-09-15: 5 mL
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
## 2016-10-03 DIAGNOSIS — Z8679 Personal history of other diseases of the circulatory system: Secondary | ICD-10-CM | POA: Insufficient documentation

## 2016-10-03 DIAGNOSIS — Z8619 Personal history of other infectious and parasitic diseases: Secondary | ICD-10-CM | POA: Insufficient documentation

## 2016-10-03 DIAGNOSIS — Z8669 Personal history of other diseases of the nervous system and sense organs: Secondary | ICD-10-CM | POA: Insufficient documentation

## 2016-10-03 DIAGNOSIS — G8929 Other chronic pain: Secondary | ICD-10-CM | POA: Insufficient documentation

## 2016-10-03 DIAGNOSIS — K219 Gastro-esophageal reflux disease without esophagitis: Secondary | ICD-10-CM | POA: Insufficient documentation

## 2016-10-03 DIAGNOSIS — M25561 Pain in right knee: Secondary | ICD-10-CM

## 2016-10-03 NOTE — Progress Notes (Signed)
Office Visit Note  Patient: Cheyenne Graham             Date of Birth: Jul 21, 1966           MRN: [id number]             PCP: Sandi Mariscal, MD Referring: Sandi Mariscal, MD Visit Date: 10/06/2016 Occupation: Mail processing    Subjective:  Hand pain   History of Present Illness: Cheyenne Graham is a 51 y.o. female seen in consultation per request of her PCP. According to patient she started having changes in her hands about 2 years ago it first started in her right fifth digit. It was felt to be related to osteoarthritis. She states over time the changes have happened in her bilateral hands. She has severe pain at times in her hands. She also reports intermittent swelling in her hands. Her hands lock up and freeze on her at times. She also had history of osteoarthritis in her bilateral knee joints she had right knee joint ReVision recently for total knee replacement she has chronic osteoarthritis in her left knee joint. She does have occasional discomfort in her shoulders and elbow joints. She denies any history of psoriasis or family history of psoriasis. She denies any history of Achilles tendinitis or plantar fasciitis, there is history of eczema. Activities of Daily Living:  Patient reports morning stiffness for 30 minutes.   Patient Reports nocturnal pain.  Difficulty dressing/grooming: Denies Difficulty climbing stairs: Reports Difficulty getting out of chair: Reports Difficulty using hands for taps, buttons, cutlery, and/or writing: Reports   Review of Systems  Constitutional: Negative for fatigue, night sweats, weight gain, weight loss and weakness.  HENT: Positive for mouth sores. Negative for trouble swallowing, trouble swallowing, mouth dryness and nose dryness.        Related to herpes  Eyes: Negative for pain, redness, visual disturbance and dryness.  Respiratory: Negative for cough, shortness of breath and difficulty breathing.   Cardiovascular: Negative for chest pain, palpitations,  hypertension, irregular heartbeat and swelling in legs/feet.  Gastrointestinal: Positive for constipation. Negative for blood in stool and diarrhea.  Endocrine: Negative for increased urination.  Genitourinary: Negative for vaginal dryness.  Musculoskeletal: Positive for arthralgias, joint pain, joint swelling, myalgias, morning stiffness and myalgias. Negative for muscle weakness and muscle tenderness.  Skin: Positive for rash. Negative for color change, hair loss, skin tightness, ulcers and sensitivity to sunlight.       History of eczema  Allergic/Immunologic: Negative for susceptible to infections.  Neurological: Negative for dizziness, memory loss and night sweats.  Hematological: Negative for swollen glands.  Psychiatric/Behavioral: Positive for sleep disturbance. Negative for depressed mood. The patient is not nervous/anxious.     PMFS History:  Patient Active Problem List   Diagnosis Date Noted  . Chronic pain of right knee 10/03/2016  . History of hypertension 10/03/2016  . History of migraine 10/03/2016  . Gastroesophageal reflux disease without esophagitis 10/03/2016  . History of herpes simplex infection 10/03/2016  . History of total knee replacement, right 06/18/2014  . Other chronic postoperative pain 01/25/2014  . Osteoarthritis of left knee 01/25/2014  . Postoperative anemia due to acute blood loss 02/24/2012  . Obesity (BMI 30-39.9) 02/24/2012  . Osteoarthritis of knee 02/09/2012    Past Medical History:  Diagnosis Date  . Arthritis   . Constipation   . Family history of anesthesia complication    ?  Aunt did not wake up.  Ms Childrey not sure where  she had surgery,l  . GERD (gastroesophageal reflux disease)    decreased since she is not taking advail  . Herpes simplex   . Hypertension    started on BP medication last Wednesday per PCP  . Migraine     2 a week  . Migraine   . Pneumonia         Family History  Problem Relation Age of Onset  . Hypertension  Mother   . Cancer Mother   . Hypertension Father   . Asthma Sister   . Migraines Sister   . Colon cancer Neg Hx    Past Surgical History:  Procedure Laterality Date  . Fatty tumor    . KNEE ARTHROSCOPY Right   . KNEE SURGERY  02/22/2012  . KNEE SURGERY Right 06/18/2014  . TOTAL KNEE ARTHROPLASTY  02/22/2012   Procedure: TOTAL KNEE ARTHROPLASTY;  Surgeon: Garald Balding, MD;  Location: [facility];  Service: Orthopedics;  Laterality: Right;  . TOTAL KNEE REVISION Right 06/18/2014   Procedure: RIGHT TOTAL KNEE REVISION;  Surgeon: Garald Balding, MD;  Location: [place];  Service: Orthopedics;  Laterality: Right;  . TUBAL LIGATION     Social History   Social History Narrative  . No narrative on file     Objective: Vital Signs: BP 128/78   Pulse 78   Resp 14   Ht 5' 5"  (1.651 m)   Wt 224 lb (101.6 kg)   LMP 09/14/2015 (Approximate)   BMI 37.28 kg/m    Physical Exam  Constitutional: She is oriented to person, place, and time. She appears well-developed and well-nourished.  HENT:  Head: Normocephalic and atraumatic.  Eyes: Conjunctivae and EOM are normal.  Neck: Normal range of motion.  Cardiovascular: Normal rate, regular rhythm, normal heart sounds and intact distal pulses.   Pulmonary/Chest: Effort normal and breath sounds normal.  Abdominal: Soft. Bowel sounds are normal.  Lymphadenopathy:    She has no cervical adenopathy.  Neurological: She is alert and oriented to person, place, and time.  Skin: Skin is warm and dry. Capillary refill takes less than 2 seconds.  Hyperpigmented dry scales on bilateral elbows consistent with eczema  Psychiatric: She has a normal mood and affect. Her behavior is normal.  Nursing note and vitals reviewed.    Musculoskeletal Exam: C-spine and thoracic lumbar spine good range of motion. Shoulder joints elbow joints with range of motion. She has good range of motion of bilateral wrist joints and MCP joints with no synovitis. She has  thickening of all PIP/DIP joints with subluxation area. Hip joints are good range of motion. Her right total knee replacement is doing well with some discomfort and she is warmth on palpation. Her left knee joint is warm to touch and had discomfort with range of motion. Ankle joints MTPs PIPs with good range of motion with no synovitis.  CDAI Exam: No CDAI exam completed.    Investigation: Findings:  07/16/2016 ace negative. 22, uric acid 4.3, HLA-B27 negative    Imaging: No results found.  Speciality Comments: No specialty comments available.    Procedures:  No procedures performed Allergies: Shrimp [shellfish allergy]   Assessment / Plan:     Visit Diagnoses: Pain in both hands -she has thickening of PIP/DIP joints in bilateral hands and CMC joints. No synovitis on examination. She is incomplete fist formation decreased grip strength and discomfort. 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 There  is a concern about enchondroma in her right third and fourth digit. I'll make a referral to hand surgery. She's been having a lot of discomfort in her bilateral CMC joints of given her prescription for the right [facility] joint brace will see response to that. I will also schedule ultrasound of her bilateral hands to look for synovitis. All autoimmune workup has been negative so far.  History of total knee replacement, right - Status post revision is still with ongoing discomfort  Chronic pain of right knee  Primary osteoarthritis of left knee: She has end-stage osteoarthritis and is awaiting totally replacement in future  Obesity (BMI 30-39.9): Weight loss diet and exercise was discussed.  Other medical problems are listed as follows.  History of hypertension  History of migraine  Gastroesophageal reflux disease without esophagitis  History of herpes simplex infection  Dyslipidemia  Vitamin D deficiency     Orders: Orders Placed This Encounter  Procedures  . XR Hand 2 View Right  . XR Hand 2 View Left   No orders of the defined types were placed in this encounter.   Face-to-face time spent with patient was 45 minutes. 50% of time was spent in counseling and coordination of care.  Follow-Up Instructions: Return for Osteoarthritis.   Bo Merino, MD  Note - This record has been created using Editor, commissioning.  Chart creation errors have been sought, but may not always  have been located. Such creation errors do not reflect on  the standard of medical care.

## 2016-10-06 ENCOUNTER — Ambulatory Visit (INDEPENDENT_AMBULATORY_CARE_PROVIDER_SITE_OTHER): Payer: Self-pay

## 2016-10-06 ENCOUNTER — Ambulatory Visit (INDEPENDENT_AMBULATORY_CARE_PROVIDER_SITE_OTHER): Payer: Federal, State, Local not specified - PPO | Admitting: Rheumatology

## 2016-10-06 ENCOUNTER — Encounter: Payer: Self-pay | Admitting: Rheumatology

## 2016-10-06 VITALS — BP 128/78 | HR 78 | Resp 14 | Ht 65.0 in | Wt 224.0 lb

## 2016-10-06 DIAGNOSIS — E669 Obesity, unspecified: Secondary | ICD-10-CM | POA: Diagnosis not present

## 2016-10-06 DIAGNOSIS — Z8619 Personal history of other infectious and parasitic diseases: Secondary | ICD-10-CM | POA: Diagnosis not present

## 2016-10-06 DIAGNOSIS — Z8669 Personal history of other diseases of the nervous system and sense organs: Secondary | ICD-10-CM | POA: Diagnosis not present

## 2016-10-06 DIAGNOSIS — Z96651 Presence of right artificial knee joint: Secondary | ICD-10-CM

## 2016-10-06 DIAGNOSIS — K219 Gastro-esophageal reflux disease without esophagitis: Secondary | ICD-10-CM

## 2016-10-06 DIAGNOSIS — M25561 Pain in right knee: Secondary | ICD-10-CM | POA: Diagnosis not present

## 2016-10-06 DIAGNOSIS — E785 Hyperlipidemia, unspecified: Secondary | ICD-10-CM

## 2016-10-06 DIAGNOSIS — M79642 Pain in left hand: Secondary | ICD-10-CM

## 2016-10-06 DIAGNOSIS — M79641 Pain in right hand: Secondary | ICD-10-CM | POA: Diagnosis not present

## 2016-10-06 DIAGNOSIS — M1712 Unilateral primary osteoarthritis, left knee: Secondary | ICD-10-CM | POA: Diagnosis not present

## 2016-10-06 DIAGNOSIS — G8929 Other chronic pain: Secondary | ICD-10-CM

## 2016-10-06 DIAGNOSIS — E559 Vitamin D deficiency, unspecified: Secondary | ICD-10-CM

## 2016-10-06 DIAGNOSIS — Z8679 Personal history of other diseases of the circulatory system: Secondary | ICD-10-CM | POA: Diagnosis not present

## 2016-10-06 NOTE — Patient Instructions (Signed)
Supplements for OA Natural anti-inflammatories  You can purchase these at [employer], [employer] or online.  . Turmeric (capsules)  . Ginger (ginger root or capsules)  . Omega 3 (Fish, flax seeds, chia seeds, walnuts, almonds)  . Tart cherry (dried or extract)   Patient should be under the care of a physician while taking these supplements. This may not be reproduced without the permission of Dr. Jefferie Holston.  

## 2016-10-22 ENCOUNTER — Telehealth: Payer: Self-pay | Admitting: Rheumatology

## 2016-10-22 NOTE — Telephone Encounter (Signed)
Patient called stating that the ibuprofen is tearing up her stomach, wants to know what else she can take for the pain.  [phone]

## 2016-10-22 NOTE — Telephone Encounter (Signed)
Attempted to return patient's call and left message for patient to call the office.  

## 2016-11-03 NOTE — Telephone Encounter (Signed)
Attempted to contact patient and left message for patient to call the office.  

## 2016-11-04 NOTE — Telephone Encounter (Signed)
Patient advised of recommendations and verbalized understanding.  

## 2016-11-04 NOTE — Telephone Encounter (Signed)
Patient states she is taking Ibuprofen for the pain in her hands and her knees. Patient states it is causing serious acid reflux and a little nausea. Patient would like to know if there is anything else she can take for the pain.

## 2016-11-04 NOTE — Telephone Encounter (Signed)
She should discontinue ibuprofen and take Tylenol instead. She should also see her PCP for treatment of her reflux.

## 2016-11-05 ENCOUNTER — Ambulatory Visit: Payer: Federal, State, Local not specified - PPO | Admitting: Rheumatology

## 2016-11-17 DIAGNOSIS — M19041 Primary osteoarthritis, right hand: Secondary | ICD-10-CM | POA: Insufficient documentation

## 2016-11-17 DIAGNOSIS — M19042 Primary osteoarthritis, left hand: Secondary | ICD-10-CM | POA: Insufficient documentation

## 2016-11-17 NOTE — Progress Notes (Signed)
Office Visit Note  Patient: Cheyenne Graham             Date of Birth: 02/15/1966           MRN: [id number]             PCP: Sandi Mariscal, MD Referring: Sandi Mariscal, MD Visit Date: 11/24/2016 Occupation: Postal clerk    Subjective:  Pain in hands   History of Present Illness: Cheyenne Graham is a 51 y.o. female returns today for follow-up visit after her initial evaluation. She states she continues to have a lot of pain and discomfort in her bilateral hands. She states her hands get numb frequently. She also has severe pain in her right third and fourth finger. She notices some swelling in her left ring finger. Right total knee replacement continues to hurt. She needs left total knee replacement as well. She has decreased grip strength. She is scheduled to see a hand surgeon for right third and fourth trigger finger.  Activities of Daily Living:  Patient reports morning stiffness for 30 minutes.   Patient Reports nocturnal pain.  Difficulty dressing/grooming: Denies Difficulty climbing stairs: Reports Difficulty getting out of chair: Reports Difficulty using hands for taps, buttons, cutlery, and/or writing: Reports   Review of Systems  Constitutional: Positive for fatigue. Negative for night sweats, weight gain, weight loss and weakness.  HENT: Negative for mouth sores, trouble swallowing, trouble swallowing, mouth dryness and nose dryness.   Eyes: Negative for pain, redness, visual disturbance and dryness.  Respiratory: Negative for cough, shortness of breath and difficulty breathing.   Cardiovascular: Negative for chest pain, palpitations, hypertension, irregular heartbeat and swelling in legs/feet.  Gastrointestinal: Negative for blood in stool, constipation and diarrhea.  Endocrine: Negative for increased urination.  Genitourinary: Negative for vaginal dryness.  Musculoskeletal: Positive for arthralgias, joint pain and morning stiffness. Negative for joint swelling, myalgias, muscle  weakness, muscle tenderness and myalgias.  Skin: Negative for color change, rash, hair loss, skin tightness, ulcers and sensitivity to sunlight.  Allergic/Immunologic: Negative for susceptible to infections.  Neurological: Negative for dizziness, memory loss and night sweats.  Hematological: Negative for swollen glands.  Psychiatric/Behavioral: Positive for sleep disturbance. Negative for depressed mood. The patient is not nervous/anxious.     PMFS History:  Patient Active Problem List   Diagnosis Date Noted  . Primary osteoarthritis of both hands 11/17/2016  . Chronic pain of right knee 10/03/2016  . History of hypertension 10/03/2016  . History of migraine 10/03/2016  . Gastroesophageal reflux disease without esophagitis 10/03/2016  . History of herpes simplex infection 10/03/2016  . History of total knee replacement, right 06/18/2014  . Other chronic postoperative pain 01/25/2014  . Osteoarthritis of left knee 01/25/2014  . Postoperative anemia due to acute blood loss 02/24/2012  . Obesity (BMI 30-39.9) 02/24/2012    Past Medical History:  Diagnosis Date  . Arthritis   . Constipation   . Family history of anesthesia complication    ?  Aunt did not wake up.  Ms Galbreath not sure where she had surgery,l  . GERD (gastroesophageal reflux disease)    decreased since she is not taking advail  . Herpes simplex   . Hypertension    started on BP medication last Wednesday per PCP  . Migraine     2 a week  . Migraine   . Pneumonia         Family History  Problem Relation Age of Onset  . Hypertension Mother   .  Cancer Mother   . Hypertension Father   . Asthma Sister   . Migraines Sister   . Colon cancer Neg Hx    Past Surgical History:  Procedure Laterality Date  . Fatty tumor    . KNEE ARTHROSCOPY Right   . KNEE SURGERY  02/22/2012  . KNEE SURGERY Right 06/18/2014  . TOTAL KNEE ARTHROPLASTY  02/22/2012   Procedure: TOTAL KNEE ARTHROPLASTY;  Surgeon: Garald Balding, MD;   Location: [facility];  Service: Orthopedics;  Laterality: Right;  . TOTAL KNEE REVISION Right 06/18/2014   Procedure: RIGHT TOTAL KNEE REVISION;  Surgeon: Garald Balding, MD;  Location: [facility];  Service: Orthopedics;  Laterality: Right;  . TUBAL LIGATION     Social History   Social History Narrative  . No narrative on file     Objective: Vital Signs: BP 124/80   Pulse 82   Resp 14   Wt 218 lb (98.9 kg)   LMP 09/14/2015 (Approximate)   BMI 39.87 kg/m    Physical Exam  Constitutional: She is oriented to person, place, and time. She appears well-developed and well-nourished.  HENT:  Head: Normocephalic and atraumatic.  Eyes: Conjunctivae and EOM are normal.  Neck: Normal range of motion.  Cardiovascular: Normal rate, regular rhythm, normal heart sounds and intact distal pulses.   Pulmonary/Chest: Effort normal and breath sounds normal.  Abdominal: Soft. Bowel sounds are normal.  Lymphadenopathy:    She has no cervical adenopathy.  Neurological: She is alert and oriented to person, place, and time.  Skin: Skin is warm and dry. Capillary refill takes less than 2 seconds.  Psychiatric: She has a normal mood and affect. Her behavior is normal.  Nursing note and vitals reviewed.    Musculoskeletal Exam: C-spine and thoracic lumbar spine good range of motion. Shoulder joints elbow joints wrist joints good range of motion. She has severe DIP PIP thickening and subluxation of several PIP joints. Tenderness across MCPs and PIP joints was noted. Hip joints knee joints ankles MTPs PIPs with good range of motion. No synovitis.  CDAI Exam: No CDAI exam completed.    Investigation: Findings:  07/16/2016 ace negative. 22, uric acid 4.3, HLA-B27 negative   No visits with results within 3 Month(s) from this visit.  Latest known visit with results is:  Office Visit on 07/16/2016  Component Date Value Ref Range Status  . Uric Acid, Serum 07/16/2016 4.3  2.5 - 7.0 mg/dL Final  . DNA  Result: 07/16/2016 Negative  Negative Final  . Results reviewed by: 07/16/2016 REPORT   Final   Comment: Karlyne Greenspan, Ph.D.,[facility] Director, Molecular Genetics The B27 allele group of the HLA-B locus is present in 2 to 9% of the general population.  About 20% of HLA-B27 carriers develop autoimmune disorders including Ankylosing Spondylitis (AS), Reactive Arthritis, Psoriatic Arthritis, Undifferentiated Oligoarthritis, Uveitis, or Inflammatory Bowel Disease. The highest association is with AS, where approximately 95% of AS patients are HLA-B27 positive. Genetic counseling as needed. Typing performed by PCR and hybridization with sequence specific oligonucleotide probes (SSO) using the FDA-cleared LABType(R) SSO Kit.   . Angiotensin-Converting Enzyme 07/16/2016 22  9 - 67 U/L Final   Comment: ** Please note change in reference range(s). **       Imaging: [place] Extrem Up Bilat Comp  Result Date: 11/24/2016 Ultrasound examination of bilateral hands was performed per EULAR recommendations. Using 12 MHz transducer, grayscale and power Doppler bilateral second, third, and fifth MCP joints, right second and third PIP, left  third and fourth PIP and bilateral wrist joints both dorsal and volar aspects were evaluated to look for synovitis or tenosynovitis. The findings were there was no synovitis or tenosynovitis on ultrasound examination. Severe narrowing of PIP joints was noted with spurring. Right median nerve was 0.15 cm squares which was more than upper limits of normal and left median nerve was 0.07 cm squares which was within normal limits. Impression: Ultrasound examination showed no synovitis. The findings were consistent with osteoarthritis. Her right median nerve was enlarged which was consistent with carpal tunnel syndrome.  Xr Knee 3 View Left  Result Date: 11/18/2016 3 views of the left knee were obtained the standing projection. There are significant tricompartmental degenerative  changes. The medial joint space is significantly narrowed with subchondral sclerosis and cyst formation. Increased varus. Degenerative changes about the patellofemoral joint and the lateral compartment are significant as well. All consistent with near end-stage osteoarthritis   Speciality Comments: No specialty comments available.    Procedures:  No procedures performed Allergies: Shrimp [shellfish allergy]   Assessment / Plan:     Visit Diagnoses: Primary osteoarthritis of left knee: She continues to have some discomfort in her bilateral knee joints .She needs left total knee replacement in future.  Pain in both hands -patient complains of ongoing pain in her bilateral hands especially in her right hand. She describes severe pain in her right third and fourth finger. She has nocturnal pain and paresthesias. Plan: [place] Extrem Up Bilat Comp. Ultrasound today revealed osteoarthritis there was no synovitis on examination she also has right median nerve enlargement consistent with carpal tunnel syndrome. She requested some medication for relief. After indications side effects contraindications were discussed she was given Neurontin 300 mg by mouth daily at bedtime. Total 30 tablets with one refill were given. She has appointment coming up with Dr. Burney Gauze for evaluation of her hands and osteoarthritis. She may get nerve conduction velocity there is well to evaluate the extent carpal tunnel syndrome. Autoimmune effort was negative which was discussed at length.  Primary osteoarthritis of both hands: Severe disease joint protection and muscle strengthening was discussed.  History of total knee replacement, right: Chronic pain  History of migraine  History of hypertension  History of gastroesophageal reflux (GERD)    Orders: Orders Placed This Encounter  Procedures  . [place] Extrem Up Bilat Comp  . COMPLETE METABOLIC PANEL WITH GFR   Meds ordered this encounter  Medications  . gabapentin  (NEURONTIN) 300 MG capsule    Sig: Take 1 capsule (300 mg total) by mouth at bedtime.    Dispense:  30 capsule    Refill:  1    Face-to-face time spent with patient was 30 minutes. 50% of time was spent in counseling and coordination of care.  Follow-Up Instructions: Return in about 2 months (around 01/24/2017) for OA, CTS.   Bo Merino, MD  Note - This record has been created using Editor, commissioning.  Chart creation errors have been sought, but may not always  have been located. Such creation errors do not reflect on  the standard of medical care.

## 2016-11-18 ENCOUNTER — Encounter (INDEPENDENT_AMBULATORY_CARE_PROVIDER_SITE_OTHER): Payer: Self-pay | Admitting: Orthopaedic Surgery

## 2016-11-18 ENCOUNTER — Ambulatory Visit (INDEPENDENT_AMBULATORY_CARE_PROVIDER_SITE_OTHER): Payer: Self-pay

## 2016-11-18 ENCOUNTER — Ambulatory Visit (INDEPENDENT_AMBULATORY_CARE_PROVIDER_SITE_OTHER): Payer: Federal, State, Local not specified - PPO | Admitting: Orthopaedic Surgery

## 2016-11-18 VITALS — BP 118/90 | HR 94 | Resp 14 | Ht 62.0 in | Wt 224.0 lb

## 2016-11-18 DIAGNOSIS — G8929 Other chronic pain: Secondary | ICD-10-CM

## 2016-11-18 DIAGNOSIS — M25562 Pain in left knee: Secondary | ICD-10-CM | POA: Diagnosis not present
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
## 2016-11-24 ENCOUNTER — Encounter: Payer: Self-pay | Admitting: Rheumatology

## 2016-11-24 ENCOUNTER — Inpatient Hospital Stay (INDEPENDENT_AMBULATORY_CARE_PROVIDER_SITE_OTHER): Payer: Self-pay

## 2016-11-24 ENCOUNTER — Ambulatory Visit (INDEPENDENT_AMBULATORY_CARE_PROVIDER_SITE_OTHER): Payer: Federal, State, Local not specified - PPO | Admitting: Rheumatology

## 2016-11-24 VITALS — BP 124/80 | HR 82 | Resp 14 | Wt 218.0 lb

## 2016-11-24 DIAGNOSIS — Z8669 Personal history of other diseases of the nervous system and sense organs: Secondary | ICD-10-CM | POA: Diagnosis not present

## 2016-11-24 DIAGNOSIS — Z8719 Personal history of other diseases of the digestive system: Secondary | ICD-10-CM

## 2016-11-24 DIAGNOSIS — Z96651 Presence of right artificial knee joint: Secondary | ICD-10-CM | POA: Diagnosis not present

## 2016-11-24 DIAGNOSIS — Z8679 Personal history of other diseases of the circulatory system: Secondary | ICD-10-CM | POA: Diagnosis not present

## 2016-11-24 DIAGNOSIS — M1712 Unilateral primary osteoarthritis, left knee: Secondary | ICD-10-CM

## 2016-11-24 DIAGNOSIS — Z5181 Encounter for therapeutic drug level monitoring: Secondary | ICD-10-CM | POA: Diagnosis not present

## 2016-11-24 DIAGNOSIS — M19042 Primary osteoarthritis, left hand: Secondary | ICD-10-CM

## 2016-11-24 DIAGNOSIS — G8929 Other chronic pain: Secondary | ICD-10-CM

## 2016-11-24 DIAGNOSIS — M25561 Pain in right knee: Secondary | ICD-10-CM

## 2016-11-24 DIAGNOSIS — M79642 Pain in left hand: Secondary | ICD-10-CM | POA: Diagnosis not present

## 2016-11-24 DIAGNOSIS — M19041 Primary osteoarthritis, right hand: Secondary | ICD-10-CM | POA: Diagnosis not present

## 2016-11-24 DIAGNOSIS — M79641 Pain in right hand: Secondary | ICD-10-CM

## 2016-11-24 MED ORDER — GABAPENTIN 300 MG PO CAPS
300.0000 mg | ORAL_CAPSULE | Freq: Every day | ORAL | 1 refills | Status: DC
Start: 2016-11-24 — End: 2017-01-22

## 2016-11-24 NOTE — Patient Instructions (Signed)
Gabapentin capsules or tablets What is this medicine? GABAPENTIN (GA ba pen tin) is used to control partial seizures in adults with epilepsy. It is also used to treat certain types of nerve pain. This medicine may be used for other purposes; ask your health care provider or pharmacist if you have questions. COMMON BRAND NAME(S): Active-PAC with Gabapentin, Gabarone, Neurontin What should I tell my health care provider before I take this medicine? They need to know if you have any of these conditions: -kidney disease -suicidal thoughts, plans, or attempt; a previous suicide attempt by you or a family member -an unusual or allergic reaction to gabapentin, other medicines, foods, dyes, or preservatives -pregnant or trying to get pregnant -breast-feeding How should I use this medicine? Take this medicine by mouth with a glass of water. Follow the directions on the prescription label. You can take it with or without food. If it upsets your stomach, take it with food.Take your medicine at regular intervals. Do not take it more often than directed. Do not stop taking except on your doctor's advice. If you are directed to break the 600 or 800 mg tablets in half as part of your dose, the extra half tablet should be used for the next dose. If you have not used the extra half tablet within 28 days, it should be thrown away. A special MedGuide will be given to you by the pharmacist with each prescription and refill. Be sure to read this information carefully each time. Talk to your pediatrician regarding the use of this medicine in children. Special care may be needed. Overdosage: If you think you have taken too much of this medicine contact a poison control center or emergency room at once. NOTE: This medicine is only for you. Do not share this medicine with others. What if I miss a dose? If you miss a dose, take it as soon as you can. If it is almost time for your next dose, take only that dose. Do not  take double or extra doses. What may interact with this medicine? Do not take this medicine with any of the following medications: -other gabapentin products This medicine may also interact with the following medications: -alcohol -antacids -antihistamines for allergy, cough and cold -certain medicines for anxiety or sleep -certain medicines for depression or psychotic disturbances -homatropine; hydrocodone -naproxen -narcotic medicines (opiates) for pain -phenothiazines like chlorpromazine, mesoridazine, prochlorperazine, thioridazine This list may not describe all possible interactions. Give your health care provider a list of all the medicines, herbs, non-prescription drugs, or dietary supplements you use. Also tell them if you smoke, drink alcohol, or use illegal drugs. Some items may interact with your medicine. What should I watch for while using this medicine? Visit your doctor or health care professional for regular checks on your progress. You may want to keep a record at home of how you feel your condition is responding to treatment. You may want to share this information with your doctor or health care professional at each visit. You should contact your doctor or health care professional if your seizures get worse or if you have any new types of seizures. Do not stop taking this medicine or any of your seizure medicines unless instructed by your doctor or health care professional. Stopping your medicine suddenly can increase your seizures or their severity. Wear a medical identification bracelet or chain if you are taking this medicine for seizures, and carry a card that lists all your medications. You may get drowsy, dizzy,   or have blurred vision. Do not drive, use machinery, or do anything that needs mental alertness until you know how this medicine affects you. To reduce dizzy or fainting spells, do not sit or stand up quickly, especially if you are an older patient. Alcohol can  increase drowsiness and dizziness. Avoid alcoholic drinks. Your mouth may get dry. Chewing sugarless gum or sucking hard candy, and drinking plenty of water will help. The use of this medicine may increase the chance of suicidal thoughts or actions. Pay special attention to how you are responding while on this medicine. Any worsening of mood, or thoughts of suicide or dying should be reported to your health care professional right away. Women who become pregnant while using this medicine may enroll in the [place] American Antiepileptic Drug Pregnancy Registry by calling 1-888-233-2334. This registry collects information about the safety of antiepileptic drug use during pregnancy. What side effects may I notice from receiving this medicine? Side effects that you should report to your doctor or health care professional as soon as possible: -allergic reactions like skin rash, itching or hives, swelling of the face, lips, or tongue -worsening of mood, thoughts or actions of suicide or dying Side effects that usually do not require medical attention (report to your doctor or health care professional if they continue or are bothersome): -constipation -difficulty walking or controlling muscle movements -dizziness -nausea -slurred speech -tiredness -tremors -weight gain This list may not describe all possible side effects. Call your doctor for medical advice about side effects. You may report side effects to FDA at 1-800-FDA-1088. Where should I keep my medicine? Keep out of reach of children. This medicine may cause accidental overdose and death if it taken by other adults, children, or pets. Mix any unused medicine with a substance like cat litter or coffee grounds. Then throw the medicine away in a sealed container like a sealed bag or a coffee can with a lid. Do not use the medicine after the expiration date. Store at room temperature between 15 and 30 degrees C (59 and 86 degrees F). NOTE: This  sheet is a summary. It may not cover all possible information. If you have questions about this medicine, talk to your doctor, pharmacist, or health care provider.  2018 Elsevier/Gold Standard (2013-10-19 15:26:50)  

## 2016-11-24 NOTE — Progress Notes (Signed)
Pharmacy Note Patient was prescribed gabapentin during today's visit.  Counseled patient on the purpose, proper use, and adverse effects of gabapentin.  No drug interactions noted.  Patient voiced understanding and denies any questions or concerns regarding her medications.

## 2017-01-14 NOTE — Progress Notes (Deleted)
 Office Visit Note  Patient: Cheyenne Graham             Date of Birth: 05/30/1966           MRN: [id number]             PCP: Sun, Yun, MD Referring: Sun, Yun, MD Visit Date: 01/25/2017 Occupation: @GUAROCC@    Subjective:  No chief complaint on file.   History of Present Illness: Cheyenne Graham is a 50 y.o. female ***   Activities of Daily Living:  Patient reports morning stiffness for *** {minute/hour:19697}.   Patient {ACTIONS;DENIES/REPORTS:21021675::"Denies"} nocturnal pain.  Difficulty dressing/grooming: {ACTIONS;DENIES/REPORTS:21021675::"Denies"} Difficulty climbing stairs: {ACTIONS;DENIES/REPORTS:21021675::"Denies"} Difficulty getting out of chair: {ACTIONS;DENIES/REPORTS:21021675::"Denies"} Difficulty using hands for taps, buttons, cutlery, and/or writing: {ACTIONS;DENIES/REPORTS:21021675::"Denies"}   No Rheumatology ROS completed.   PMFS History:  Patient Active Problem List   Diagnosis Date Noted  . Primary osteoarthritis of both hands 11/17/2016  . Chronic pain of right knee 10/03/2016  . History of hypertension 10/03/2016  . History of migraine 10/03/2016  . Gastroesophageal reflux disease without esophagitis 10/03/2016  . History of herpes simplex infection 10/03/2016  . History of total knee replacement, right 06/18/2014  . Other chronic postoperative pain 01/25/2014  . Osteoarthritis of left knee 01/25/2014  . Postoperative anemia due to acute blood loss 02/24/2012  . Obesity (BMI 30-39.9) 02/24/2012    Past Medical History:  Diagnosis Date  . Arthritis   . Constipation   . Family history of anesthesia complication    ?  Aunt did not wake up.  Ms Creamer not sure where she had surgery,l  . GERD (gastroesophageal reflux disease)    decreased since she is not taking advail  . Herpes simplex   . Hypertension    started on BP medication last Wednesday per PCP  . Migraine     2 a week  . Migraine   . Pneumonia         Family History  Problem  Relation Age of Onset  . Hypertension Mother   . Cancer Mother   . Hypertension Father   . Asthma Sister   . Migraines Sister   . Colon cancer Neg Hx    Past Surgical History:  Procedure Laterality Date  . Fatty tumor    . KNEE ARTHROSCOPY Right   . KNEE SURGERY  02/22/2012  . KNEE SURGERY Right 06/18/2014  . TOTAL KNEE ARTHROPLASTY  02/22/2012   Procedure: TOTAL KNEE ARTHROPLASTY;  Surgeon: Peter W Whitfield, MD;  Location: [facility];  Service: Orthopedics;  Laterality: Right;  . TOTAL KNEE REVISION Right 06/18/2014   Procedure: RIGHT TOTAL KNEE REVISION;  Surgeon: Peter W Whitfield, MD;  Location: [facility];  Service: Orthopedics;  Laterality: Right;  . TUBAL LIGATION     Social History   Social History Narrative  . No narrative on file     Objective: Vital Signs: LMP 09/14/2015 (Approximate)    Physical Exam   Musculoskeletal Exam: ***  CDAI Exam: No CDAI exam completed.    Investigation: Findings:  07/16/2016 ace negative. 22, uric acid 4.3, HLA-B27 negative      Imaging: No results found.  Speciality Comments: No specialty comments available.    Procedures:  No procedures performed Allergies: Shrimp [shellfish allergy]   Assessment / Plan:     Visit Diagnoses: Primary osteoarthritis of both hands  Primary osteoarthritis of left knee  History of hypertension  History of migraine  History of total knee replacement, right  History   of gastroesophageal reflux (GERD)  History of herpes simplex infection  Chronic pain of right knee  Obesity (BMI 30-39.9)    Orders: No orders of the defined types were placed in this encounter.  No orders of the defined types were placed in this encounter.   Face-to-face time spent with patient was *** minutes. 50% of time was spent in counseling and coordination of care.  Follow-Up Instructions: No Follow-up on file.   Amy Littrell, RT  Note - This record has been created using Dragon software.  Chart  creation errors have been sought, but may not always  have been located. Such creation errors do not reflect on  the standard of medical care. 

## 2017-01-22 ENCOUNTER — Other Ambulatory Visit: Payer: Self-pay | Admitting: Rheumatology

## 2017-01-24 NOTE — Telephone Encounter (Signed)
Last Visit: 11/24/16 Next Visit: 01/25/17   Okay to refill Gabapentin?

## 2017-01-24 NOTE — Telephone Encounter (Signed)
ok 

## 2017-01-25 ENCOUNTER — Ambulatory Visit: Payer: Federal, State, Local not specified - PPO | Admitting: Rheumatology

## 2017-03-28 ENCOUNTER — Other Ambulatory Visit: Payer: Self-pay | Admitting: Rheumatology

## 2017-03-28 NOTE — Telephone Encounter (Signed)
ok 

## 2017-03-28 NOTE — Telephone Encounter (Signed)
Last Visit: 11/24/16 Next Visit was due in May 2018. Message sent to the front to schedule patient.   Okay to refill Gabapentin?  

## 2017-03-29 ENCOUNTER — Telehealth: Payer: Self-pay | Admitting: Rheumatology

## 2017-03-29 NOTE — Telephone Encounter (Signed)
-----   Message from Carole Binning, LPN sent at 09/21/5788  8:44 AM EDT ----- Regarding: Please schedule patient for follow up visit Please schedule patient for follow up visit. Patient was due May 2018. Thanks!

## 2017-03-29 NOTE — Telephone Encounter (Signed)
Left message on machine for patient to call back to schedule follow up appt.

## 2017-06-04 ENCOUNTER — Other Ambulatory Visit: Payer: Self-pay | Admitting: Rheumatology

## 2017-06-06 NOTE — Telephone Encounter (Signed)
ok 

## 2017-06-06 NOTE — Telephone Encounter (Signed)
Last Visit: 11/24/16 Next Visit was due in May 2018. Message sent to the front to schedule patient.   Okay to refill Gabapentin?

## 2017-06-20 ENCOUNTER — Ambulatory Visit (INDEPENDENT_AMBULATORY_CARE_PROVIDER_SITE_OTHER): Payer: Federal, State, Local not specified - PPO | Admitting: Orthopaedic Surgery

## 2017-06-20 ENCOUNTER — Encounter (INDEPENDENT_AMBULATORY_CARE_PROVIDER_SITE_OTHER): Payer: Self-pay | Admitting: Orthopaedic Surgery

## 2017-06-20 VITALS — BP 117/82 | HR 106 | Resp 16 | Ht 65.0 in | Wt 179.0 lb

## 2017-06-20 DIAGNOSIS — M1712 Unilateral primary osteoarthritis, left knee: Secondary | ICD-10-CM

## 2017-06-20 DIAGNOSIS — M25562 Pain in left knee: Secondary | ICD-10-CM

## 2017-06-20 DIAGNOSIS — G8929 Other chronic pain: Secondary | ICD-10-CM

## 2017-06-20 MED ORDER — METHYLPREDNISOLONE ACETATE 40 MG/ML IJ SUSP
80.0000 mg | INTRAMUSCULAR | Status: AC | PRN
  Administered 2017-06-20: 80 mg

## 2017-06-20 MED ORDER — LIDOCAINE HCL 1 % IJ SOLN
5.0000 mL | INTRAMUSCULAR | Status: AC | PRN
  Administered 2017-06-20: 5 mL

## 2017-06-20 MED ORDER — BUPIVACAINE HCL 0.5 % IJ SOLN
3.0000 mL | INTRAMUSCULAR | Status: AC | PRN
  Administered 2017-06-20: 3 mL via INTRA_ARTICULAR

## 2017-06-20 NOTE — Progress Notes (Signed)
Office Visit Note   Patient: Cheyenne Graham           Date of Birth: [birth year]           MRN: [id number] Visit Date: 06/20/2017              Requested by: Sandi Mariscal, [place], [place] [address] PCP: Sandi Mariscal, MD   Assessment & Plan: Visit Diagnoses:  1. Chronic pain of left knee   2. Primary osteoarthritis of left knee     Plan: Primary osteoarthritis left knee by prior x-rays. Having some recurrent pain. would like cortisone injection. We'll inject the knee and see back as needed. Long discussion regarding different treatment options.  Follow-Up Instructions: Return if symptoms worsen or fail to improve.   Orders:  No orders of the defined types were placed in this encounter.  No orders of the defined types were placed in this encounter.     Procedures: Large Joint Inj Date/Time: 06/20/2017 2:38 PM Performed by: Garald Balding Authorized by: Garald Balding   Consent Given by:  Patient Timeout: prior to procedure the correct patient, procedure, and site was verified   Indications:  Pain and joint swelling Location:  Knee Site:  L knee Prep: patient was prepped and draped in usual sterile fashion   Needle Size:  25 G Needle Length:  1.5 inches Approach:  Anteromedial Ultrasound Guidance: No   Fluoroscopic Guidance: No   Arthrogram: No   Medications:  5 mL lidocaine 1 %; 80 mg methylPREDNISolone acetate 40 MG/ML; 3 mL bupivacaine 0.5 % Aspiration Attempted: No   Patient tolerance:  Patient tolerated the procedure well with no immediate complications     Clinical Data: No additional findings.   Subjective: Chief Complaint  Patient presents with  . Left Knee - Pain, Edema    Cheyenne Graham is a 51 y o that is here for chronic Left knee pain.SHe relates her left knee has a "bone" coming out of the knee that is painful. He also has lost 30 lbs.  Cheyenne Graham is had a prior diagnosis of osteoarthritis of both knees. She's had a prior right total  knee replacement with loosening right requiring a total knee revision. She still is having some discomfort with her knee up until recently. She is lost over 51 pounds with a goal of losing 22 more. As a result she is having much less pain in her right knee to better really "doesn't bother me". She continues to have some recurrent pain or left knee. She's had prior films demonstrating significant osteoarthritis. She's not had any fever or chills. She is on her feet up to 10-12 hrs. a day working  HPI  Review of Systems  Constitutional: Negative for chills, fatigue and fever.  Eyes: Negative for itching.  Respiratory: Negative for chest tightness and shortness of breath.   Cardiovascular: Negative for chest pain, palpitations and leg swelling.  Gastrointestinal: Negative for blood in stool, constipation and diarrhea.  Endocrine: Negative for polyuria.  Genitourinary: Negative for dysuria.  Musculoskeletal: Negative for back pain, joint swelling, neck pain and neck stiffness.  Allergic/Immunologic: Negative for immunocompromised state.  Neurological: Negative for dizziness and numbness.  Hematological: Does not bruise/bleed easily.  Psychiatric/Behavioral: The patient is not nervous/anxious.      Objective: Vital Signs: BP 117/82   Pulse (!) 106   Resp 16   Ht 5\' 5"  (1.651 m)   Wt 179 lb (81.2 kg)   LMP  09/14/2015 (Approximate)   BMI 29.79 kg/m   Physical Exam  Ortho Exam awake alert and oriented 3. Comfortable sitting right knee was not hot red warm or swollen. No instability. Full extension and flexed about 100. No calf pain neurovascular exam intact. Was not effused.  Left knee without effusion. Predominantly lateral joint pain with slight increased valgus with weightbearing. Some patellar crepitation. Minimal medial joint pain. No instability. No calf discomfort or mass in the popliteal space. Neurovascular exam intact distally  Specialty Comments:  No specialty comments  available.  Imaging: No results found.   PMFS History: Patient Active Problem List   Diagnosis Date Noted  . Primary osteoarthritis of both hands 11/17/2016  . Chronic pain of right knee 10/03/2016  . History of hypertension 10/03/2016  . History of migraine 10/03/2016  . Gastroesophageal reflux disease without esophagitis 10/03/2016  . History of herpes simplex infection 10/03/2016  . History of total knee replacement, right 06/18/2014  . Other chronic postoperative pain 01/25/2014  . Osteoarthritis of left knee 01/25/2014  . Postoperative anemia due to acute blood loss 02/24/2012  . Obesity (BMI 30-39.9) 02/24/2012   Past Medical History:  Diagnosis Date  . Arthritis   . Constipation   . Family history of anesthesia complication    ?  Aunt did not wake up.  Cheyenne Graham not sure where she had surgery,l  . GERD (gastroesophageal reflux disease)    decreased since she is not taking advail  . Herpes simplex   . Hypertension    started on BP medication last Wednesday per PCP  . Migraine     2 a week  . Migraine   . Pneumonia         Family History  Problem Relation Age of Onset  . Hypertension Mother   . Cancer Mother   . Hypertension Father   . Asthma Sister   . Migraines Sister   . Colon cancer Neg Hx     Past Surgical History:  Procedure Laterality Date  . Fatty tumor    . KNEE ARTHROSCOPY Right   . KNEE SURGERY  02/22/2012  . KNEE SURGERY Right 06/18/2014  . TOTAL KNEE ARTHROPLASTY  02/22/2012   Procedure: TOTAL KNEE ARTHROPLASTY;  Surgeon: Garald Balding, MD;  Location: [facility];  Service: Orthopedics;  Laterality: Right;  . TOTAL KNEE REVISION Right 06/18/2014   Procedure: RIGHT TOTAL KNEE REVISION;  Surgeon: Garald Balding, MD;  Location: [facility];  Service: Orthopedics;  Laterality: Right;  . TUBAL LIGATION     Social History   Occupational History  . Not on file.   Social History Main Topics  . Smoking status: Never Smoker  . Smokeless tobacco:  Never Used  . Alcohol use 0.0 oz/week     Comment: occasional  . Drug use: No  . Sexual activity: Yes    Birth control/ protection: Surgical

## 2017-06-22 ENCOUNTER — Other Ambulatory Visit: Payer: Self-pay | Admitting: Internal Medicine

## 2017-07-07 ENCOUNTER — Telehealth: Payer: Self-pay | Admitting: Rheumatology

## 2017-07-07 NOTE — Telephone Encounter (Signed)
I LMOVM for patient to call, and schedule rov. °

## 2017-07-07 NOTE — Telephone Encounter (Signed)
-----   Message from Carole Binning, LPN sent at 11/09/91  9:14 AM EDT ----- Regarding: Please schedule patient for follow up visit Please schedule patient for follow up visit. Patient was due May 2018. Thanks!

## 2017-07-20 ENCOUNTER — Encounter (INDEPENDENT_AMBULATORY_CARE_PROVIDER_SITE_OTHER): Payer: Self-pay | Admitting: Orthopedic Surgery

## 2017-07-20 ENCOUNTER — Ambulatory Visit (INDEPENDENT_AMBULATORY_CARE_PROVIDER_SITE_OTHER): Payer: Federal, State, Local not specified - PPO | Admitting: Orthopedic Surgery

## 2017-07-20 VITALS — BP 115/86 | HR 109 | Resp 16 | Ht 65.0 in | Wt 179.0 lb

## 2017-07-20 DIAGNOSIS — M1712 Unilateral primary osteoarthritis, left knee: Secondary | ICD-10-CM

## 2017-07-20 MED ORDER — DICLOFENAC SODIUM 1 % TD GEL: 2.0000 g | Freq: Four times a day (QID) | TRANSDERMAL | 2 refills | Status: DC

## 2017-07-20 MED ORDER — LIDOCAINE HCL 1 % IJ SOLN
2.0000 mL | INTRAMUSCULAR | Status: AC | PRN
  Administered 2017-07-20: 2 mL

## 2017-07-20 MED ORDER — METHYLPREDNISOLONE ACETATE 40 MG/ML IJ SUSP
80.0000 mg | INTRAMUSCULAR | Status: AC | PRN
  Administered 2017-07-20: 80 mg

## 2017-07-20 MED ORDER — BUPIVACAINE HCL 0.5 % IJ SOLN
2.0000 mL | INTRAMUSCULAR | Status: AC | PRN
  Administered 2017-07-20: 2 mL via INTRA_ARTICULAR

## 2017-07-20 NOTE — Telephone Encounter (Signed)
Per patient Dr. Estanislado Pandy is sending her out to another doctor for her hands, and does not have a tx option for her. Patient does not need Gabapentin, or a follow up appt with doctor at this time due to seeing another doctor of hands.

## 2017-07-20 NOTE — Progress Notes (Signed)
Office Visit Note   Patient: Cheyenne Graham           Date of Birth: 1965/12/07           MRN: [id number] Visit Date: 07/20/2017              Requested by: Sandi Mariscal, [place], [place] [address] PCP: Sandi Mariscal, MD   Assessment & Plan: Visit Diagnoses:  1. Unilateral primary osteoarthritis, left knee     Plan:  #1: Corticosteroid injection to the left knee was performed with excellent results post injection with pain relief. She states this is different than what she had initially last month. #2: Out of work today and tomorrow #3: We will precertified for Visco supplementation  Follow-Up Instructions: Return if symptoms worsen or fail to improve.   Orders:  No orders of the defined types were placed in this encounter.  Meds ordered this encounter  Medications  . diclofenac sodium (VOLTAREN) 1 % GEL    Sig: Apply 2-4 g 4 (four) times daily topically.    Dispense:  5 Tube    Refill:  2    Order Specific Question:   Supervising Provider    Answer:   Garald Balding [[id number]]      Procedures: Large Joint Inj: L knee on 07/20/2017 12:26 PM Indications: pain and diagnostic evaluation Details: 25 G 1.5 in needle, anteromedial approach  Arthrogram: No  Medications: 2 mL lidocaine 1 %; 2 mL bupivacaine 0.5 %; 80 mg methylPREDNISolone acetate 40 MG/ML Procedure, treatment alternatives, risks and benefits explained, specific risks discussed. Consent was given by the patient. Patient was prepped and draped in the usual sterile fashion.       Clinical Data: No additional findings.   Subjective: Chief Complaint  Patient presents with  . Left Knee - Pain  . Knee Pain    Left knee pain x 3 weeks, difficulty walking, difficulty sleeping at night, sharp, stabbing pain, popping, clicking, grinding noises, Advil doesn't help    HPI  Cheyenne Graham is a very pleasant 51 year old 39 American female who is seen today for evaluation of her left knee. She's had  chronic left knee pain for many years. She did have her right total knee arthroplasty in the past and has done fairly well with that overall. That was a revision of a total knee replacement. She has lost over 51 pounds and is actually doing well from that standpoint. Her right knee is continuing to do well overall and does not really bother her. Her left knee is quite symptomatic. She did have a cortisone injection back on October 15 and at that time she states that the there was a very painful injection and she had no relief at all with that. She comes in today for reevaluation. Unfortunate she does work for the [employer] and is putting in long days. She denies any recent injury or trauma. Denies any hip or groin pain. Eyes any skin rashes.  Review of Systems  Constitutional: Positive for fatigue.  HENT: Negative for trouble swallowing.   Respiratory: Negative for shortness of breath.   Cardiovascular: Negative for leg swelling.  Gastrointestinal: Positive for constipation. Negative for diarrhea.  Endocrine: Negative for heat intolerance.  Genitourinary: Negative for difficulty urinating.  Musculoskeletal: Positive for joint swelling. Negative for back pain and gait problem.  Skin: Negative for rash.  Neurological: Positive for headaches.  Hematological: Bruises/bleeds easily.  Psychiatric/Behavioral: Positive for sleep disturbance.  Objective: Vital Signs: BP 115/86 (BP Location: Right Arm, Patient Position: Sitting, Cuff Size: Normal)   Pulse (!) 109   Resp 16   Ht 5\' 5"  (1.651 m)   Wt 179 lb (81.2 kg)   LMP 09/14/2015 (Approximate)   BMI 29.79 kg/m   Physical Exam  Constitutional: She is oriented to person, place, and time. She appears well-developed and well-nourished.  HENT:  Head: Normocephalic and atraumatic.  Eyes: EOM are normal. Pupils are equal, round, and reactive to light.  Pulmonary/Chest: Effort normal.  Neurological: She is alert and oriented to person, place,  and time.  Skin: Skin is warm and dry.  Psychiatric: She has a normal mood and affect. Her behavior is normal. Judgment and thought content normal.    Ortho Exam  Exam today of the left knee reveals no warmth or erythema. She does have a trace effusion. There is some patellofemoral crepitance with range of motion. She is tender to palpation over the medial joint line. She opens a little with valgus stressing. Good endpoint. Neurovascularly intact distally. Calf is supple and nontender. She is tender over the proximal medial tibial plateau. Good hip motion without pain or crepitance.  Specialty Comments:  No specialty comments available.  Imaging: No results found.  X-rays reviewed from 07/20/2017.   PMFS History: Patient Active Problem List   Diagnosis Date Noted  . Primary osteoarthritis of both hands 11/17/2016  . Chronic pain of right knee 10/03/2016  . History of hypertension 10/03/2016  . History of migraine 10/03/2016  . Gastroesophageal reflux disease without esophagitis 10/03/2016  . History of herpes simplex infection 10/03/2016  . History of total knee replacement, right 06/18/2014  . Other chronic postoperative pain 01/25/2014  . Osteoarthritis of left knee 01/25/2014  . Postoperative anemia due to acute blood loss 02/24/2012  . Obesity (BMI 30-39.9) 02/24/2012   Past Medical History:  Diagnosis Date  . Arthritis   . Constipation   . Family history of anesthesia complication    ?  Aunt did not wake up.  Ms Aziz not sure where she had surgery,l  . GERD (gastroesophageal reflux disease)    decreased since she is not taking advail  . Herpes simplex   . Hypertension    started on BP medication last Wednesday per PCP  . Migraine     2 a week  . Migraine   . Pneumonia         Family History  Problem Relation Age of Onset  . Hypertension Mother   . Cancer Mother   . Hypertension Father   . Asthma Sister   . Migraines Sister   . Colon cancer Neg Hx       Past Surgical History:  Procedure Laterality Date  . Fatty tumor    . KNEE ARTHROSCOPY Right   . KNEE SURGERY  02/22/2012  . KNEE SURGERY Right 06/18/2014  . TUBAL LIGATION     Social History   Occupational History  . Not on file  Tobacco Use  . Smoking status: Never Smoker  . Smokeless tobacco: Never Used  Substance and Sexual Activity  . Alcohol use: Yes    Alcohol/week: 0.0 oz    Comment: occasional  . Drug use: No  . Sexual activity: Yes    Birth control/protection: Surgical

## 2017-08-08 ENCOUNTER — Other Ambulatory Visit: Payer: Self-pay | Admitting: Rheumatology

## 2017-08-08 NOTE — Telephone Encounter (Signed)
Last Visit: 11/24/16 Next Visit was due in May 2018. Message sent to the front to schedule patient.   Okay to refill Gabapentin?

## 2017-08-08 NOTE — Telephone Encounter (Signed)
Ok. Pl sch fu

## 2017-08-11 ENCOUNTER — Telehealth: Payer: Self-pay

## 2017-08-11 NOTE — Telephone Encounter (Signed)
Left VM advising patient the number for [employer] concerning her FMLA paperwork.

## 2017-09-20 ENCOUNTER — Telehealth: Payer: Self-pay | Admitting: Rheumatology

## 2017-09-20 NOTE — Telephone Encounter (Signed)
I LMOM for patient to call, and schedule rov with doctor.

## 2017-09-20 NOTE — Telephone Encounter (Signed)
-----   Message from Carole Binning, LPN sent at 00/11/7046  9:18 AM EST ----- Regarding: Please schedule patient a follow up visit Please schedule patient a follow up visit. Patient was due May 2018. Thanks!

## 2017-10-26 ENCOUNTER — Ambulatory Visit (INDEPENDENT_AMBULATORY_CARE_PROVIDER_SITE_OTHER): Payer: Federal, State, Local not specified - PPO | Admitting: Orthopedic Surgery

## 2017-10-27 ENCOUNTER — Ambulatory Visit (INDEPENDENT_AMBULATORY_CARE_PROVIDER_SITE_OTHER): Payer: Federal, State, Local not specified - PPO | Admitting: Orthopedic Surgery

## 2017-10-27 ENCOUNTER — Ambulatory Visit (INDEPENDENT_AMBULATORY_CARE_PROVIDER_SITE_OTHER): Payer: Self-pay

## 2017-10-27 ENCOUNTER — Encounter (INDEPENDENT_AMBULATORY_CARE_PROVIDER_SITE_OTHER): Payer: Self-pay | Admitting: Orthopedic Surgery

## 2017-10-27 DIAGNOSIS — M1712 Unilateral primary osteoarthritis, left knee: Secondary | ICD-10-CM | POA: Diagnosis not present

## 2017-10-27 MED ORDER — BUPIVACAINE HCL 0.5 % IJ SOLN
2.0000 mL | INTRAMUSCULAR | Status: AC | PRN
  Administered 2017-10-27: 2 mL via INTRA_ARTICULAR

## 2017-10-27 MED ORDER — METHYLPREDNISOLONE ACETATE 40 MG/ML IJ SUSP
80.0000 mg | INTRAMUSCULAR | Status: AC | PRN
  Administered 2017-10-27: 80 mg

## 2017-10-27 MED ORDER — LIDOCAINE HCL 1 % IJ SOLN
2.0000 mL | INTRAMUSCULAR | Status: AC | PRN
  Administered 2017-10-27: 2 mL

## 2017-10-27 NOTE — Progress Notes (Signed)
Office Visit Note   Patient: Cheyenne Graham           Date of Birth: 16-Mar-1966           MRN: [id number] Visit Date: 10/27/2017              Requested by: Sandi Mariscal, [place], [place] [id number] PCP: Sandi Mariscal, MD   Assessment & Plan: Visit Diagnoses:  1. Unilateral primary osteoarthritis, left knee     Plan:  #1: Corticosteroid injection was placed into the left knee without difficulty. #2: Note was written for her to be out of work until next week if she is unable to work tomorrow.  Follow-Up Instructions: Return if symptoms worsen or fail to improve.   Orders:  Orders Placed This Encounter  Procedures  . Large Joint Inj: L knee  . XR KNEE 3 VIEW LEFT   No orders of the defined types were placed in this encounter.     Procedures: Large Joint Inj: L knee on 10/27/2017 11:31 AM Indications: pain and diagnostic evaluation Details: 25 G 1.5 in needle, anteromedial approach  Arthrogram: No  Medications: 2 mL lidocaine 1 %; 2 mL bupivacaine 0.5 %; 80 mg methylPREDNISolone acetate 40 MG/ML Procedure, treatment alternatives, risks and benefits explained, specific risks discussed. Consent was given by the patient. Patient was prepped and draped in the usual sterile fashion.       Clinical Data: No additional findings.   Subjective: Chief Complaint  Patient presents with  . Left Knee - Pain    Cheyenne Graham is a 52 y o here today for chronic Left knee pain.    HPI  Cheyenne Graham is seen back today for evaluation of her left knee.  She has had chronic osteoarthritis of the left knee with chronic knee pain.  We have performed a right total knee arthroplasty with revision and this is doing fairly well.  However her left knee continues to be a bother for her.  Is been about a year since we last x-rayed her.  We have tried to continue to treat her conservatively since she is not ready to have a total knee replacement..  She does have pain with every step, nighttime  pain, as well as pain with activities of daily living.  Seen today for evaluation.   Review of Systems  Constitutional: Positive for fatigue. Negative for chills and fever.  HENT: Positive for sinus pain. Negative for hearing loss and tinnitus.   Eyes: Negative for itching.  Respiratory: Negative for chest tightness and shortness of breath.   Cardiovascular: Negative for chest pain, palpitations and leg swelling.  Gastrointestinal: Negative for blood in stool, constipation and diarrhea.  Endocrine: Negative for polyuria.  Genitourinary: Negative for dysuria.  Musculoskeletal: Positive for joint swelling. Negative for back pain, neck pain and neck stiffness.  Allergic/Immunologic: Negative for immunocompromised state.  Neurological: Negative for dizziness, numbness and headaches.  Hematological: Does not bruise/bleed easily.  Psychiatric/Behavioral: Positive for sleep disturbance. The patient is not nervous/anxious.      Objective: Vital Signs: LMP 09/14/2015 (Approximate)   Physical Exam  Constitutional: She is oriented to person, place, and time. She appears well-developed and well-nourished.  HENT:  Head: Normocephalic and atraumatic.  Eyes: EOM are normal. Pupils are equal, round, and reactive to light.  Pulmonary/Chest: Effort normal.  Neurological: She is alert and oriented to person, place, and time.  Skin: Skin is warm and dry.  Psychiatric: She has a normal mood  and affect. Her behavior is normal. Judgment and thought content normal.    Ortho Exam  Left knee today reveals increased valgus with weightbearing.  She does have crepitance with range of motion at the patellofemoral and tibial femoral joints.  She does have pain to palpation along the medial joint line but more than lateral.  Specialty Comments:  No specialty comments available.  Imaging: Xr Knee 3 View Left  Result Date: 10/27/2017 4 view x-ray of the left knee reveals tricompartmental osteoarthritis of  the knee.  She is bone-on-bone medially with periarticular spurring noted.  Appears she has some translation of the distal femur medially on the proximal tibial plateau.  Peaked intercondylar spines are noted.  She does have sclerosing and cystic changes in both the distal medial femoral condyle and proximal medial tibial plateau.  On the lateral she does have significant spurring in the patellofemoral joint and along the femoral condyle.    PMFS History: Patient Active Problem List   Diagnosis Date Noted  . Primary osteoarthritis of both hands 11/17/2016  . Chronic pain of right knee 10/03/2016  . History of hypertension 10/03/2016  . History of migraine 10/03/2016  . Gastroesophageal reflux disease without esophagitis 10/03/2016  . History of herpes simplex infection 10/03/2016  . History of total knee replacement, right 06/18/2014  . Other chronic postoperative pain 01/25/2014  . Osteoarthritis of left knee 01/25/2014  . Postoperative anemia due to acute blood loss 02/24/2012  . Obesity (BMI 30-39.9) 02/24/2012   Past Medical History:  Diagnosis Date  . Arthritis   . Constipation   . Family history of anesthesia complication    ?  Aunt did not wake up.  Cheyenne Graham not sure where she had surgery,l  . GERD (gastroesophageal reflux disease)    decreased since she is not taking advail  . Herpes simplex   . Hypertension    started on BP medication last Wednesday per PCP  . Migraine     2 a week  . Migraine   . Pneumonia         Family History  Problem Relation Age of Onset  . Hypertension Mother   . Cancer Mother   . Hypertension Father   . Asthma Sister   . Migraines Sister   . Colon cancer Neg Hx     Past Surgical History:  Procedure Laterality Date  . Fatty tumor    . KNEE ARTHROSCOPY Right   . KNEE SURGERY  02/22/2012  . KNEE SURGERY Right 06/18/2014  . TOTAL KNEE ARTHROPLASTY  02/22/2012   Procedure: TOTAL KNEE ARTHROPLASTY;  Surgeon: Garald Balding, MD;   Location: [facility];  Service: Orthopedics;  Laterality: Right;  . TOTAL KNEE REVISION Right 06/18/2014   Procedure: RIGHT TOTAL KNEE REVISION;  Surgeon: Garald Balding, MD;  Location: [facility];  Service: Orthopedics;  Laterality: Right;  . TUBAL LIGATION     Social History   Occupational History  . Not on file  Tobacco Use  . Smoking status: Never Smoker  . Smokeless tobacco: Never Used  Substance and Sexual Activity  . Alcohol use: Yes    Alcohol/week: 0.0 oz    Comment: occasional  . Drug use: No  . Sexual activity: Yes    Birth control/protection: Surgical

## 2017-12-21 ENCOUNTER — Encounter (INDEPENDENT_AMBULATORY_CARE_PROVIDER_SITE_OTHER): Payer: Self-pay

## 2017-12-21 ENCOUNTER — Encounter (INDEPENDENT_AMBULATORY_CARE_PROVIDER_SITE_OTHER): Payer: Self-pay | Admitting: Orthopedic Surgery

## 2017-12-21 ENCOUNTER — Ambulatory Visit (INDEPENDENT_AMBULATORY_CARE_PROVIDER_SITE_OTHER): Payer: Federal, State, Local not specified - PPO

## 2017-12-21 ENCOUNTER — Ambulatory Visit (INDEPENDENT_AMBULATORY_CARE_PROVIDER_SITE_OTHER): Payer: Federal, State, Local not specified - PPO | Admitting: Orthopedic Surgery

## 2017-12-21 VITALS — BP 146/85 | HR 83 | Resp 16 | Ht 65.0 in | Wt 178.0 lb

## 2017-12-21 DIAGNOSIS — M25561 Pain in right knee: Secondary | ICD-10-CM | POA: Diagnosis not present

## 2017-12-21 DIAGNOSIS — M1712 Unilateral primary osteoarthritis, left knee: Secondary | ICD-10-CM | POA: Diagnosis not present

## 2017-12-21 DIAGNOSIS — Z96651 Presence of right artificial knee joint: Secondary | ICD-10-CM | POA: Diagnosis not present

## 2017-12-21 DIAGNOSIS — G8929 Other chronic pain: Secondary | ICD-10-CM

## 2017-12-21 MED ORDER — DICLOFENAC SODIUM 75 MG PO TBEC: 75.0000 mg | DELAYED_RELEASE_TABLET | Freq: Two times a day (BID) | ORAL | 0 refills | Status: DC

## 2017-12-21 NOTE — Progress Notes (Signed)
Office Visit Note   Patient: Cheyenne Graham           Date of Birth: February 19, 1966           MRN: [id number] Visit Date: 12/21/2017              Requested by: Sandi Mariscal, [place], [place] [address] PCP: Sandi Mariscal, MD   Assessment & Plan: Visit Diagnoses:  1. Unilateral primary osteoarthritis, left knee   2. Chronic pain of right knee   3. Total knee replacement status, right     Plan:  #1: Stop her Advil and Aleve and any other nonsteroidal anti-inflammatories.  I prescribed diclofenac 75 mg 1 p.o. twice daily with food. #2: She can use to regular strength Tylenol every 8 hours to control her pain as needed   Follow-Up Instructions: Return in about 1 month (around 01/18/2018).   Orders:  Orders Placed This Encounter  Procedures  . XR KNEE 3 VIEW RIGHT   Meds ordered this encounter  Medications  . diclofenac (VOLTAREN) 75 MG EC tablet    Sig: Take 1 tablet (75 mg total) by mouth 2 (two) times daily. With FOOD!  Do not use Advil, Aleve, etc (NSAID's) at the same time    Dispense:  60 tablet    Refill:  0    Order Specific Question:   Supervising Provider    Answer:   Garald Balding [[id number]]      Procedures: No procedures performed   Clinical Data: No additional findings.   Subjective: Chief Complaint  Patient presents with  . Right Knee - Pain  . Left Knee - Pain  . Knee Pain    Bil knee pain, right hurts worse than left, no injury, swelling, stiffness, difficulty walking, locking, TKR - right, IBU doesn't help, limping, no surgery to left knee, not diabetic    HPI  52 year old African-American female who is seen today for bilateral knee pain.  She is status post right total knee arthroplasty with revision arthroplasty.  Pain along the medial border and posterior tibial area.  Her left knee is so degenerated and painful if she does have an antalgic limp and feels a possibly the reason for her pain in both knees and legs is secondary to her  gait.  She has been "eating" Advil which unfortunately has not been beneficial.  She has recently had a corticosteroid injection 2 months ago in the left knee.  Seen today for evaluation.    Review of Systems  Constitutional: Positive for activity change and fatigue.  HENT: Negative for trouble swallowing.   Eyes: Negative for pain.  Respiratory: Negative for shortness of breath.   Cardiovascular: Positive for leg swelling.  Gastrointestinal: Positive for constipation.  Endocrine: Negative for cold intolerance.  Genitourinary: Negative for difficulty urinating.  Musculoskeletal: Positive for gait problem and joint swelling.  Skin: Negative for rash.  Allergic/Immunologic: Positive for food allergies.  Neurological: Positive for weakness.  Hematological: Does not bruise/bleed easily.  Psychiatric/Behavioral: Positive for sleep disturbance.     Objective: Vital Signs: BP (!) 146/85 (BP Location: Right Arm, Patient Position: Sitting, Cuff Size: Normal)   Pulse 83   Resp 16   Ht 5\' 5"  (1.651 m)   Wt 178 lb (80.7 kg)   LMP 09/14/2015 (Approximate)   BMI 29.62 kg/m   Physical Exam  Constitutional: She is oriented to person, place, and time. She appears well-developed and well-nourished.  HENT:  Head: Normocephalic and  atraumatic.  Eyes: Pupils are equal, round, and reactive to light. EOM are normal.  Pulmonary/Chest: Effort normal.  Neurological: She is alert and oriented to person, place, and time.  Skin: Skin is warm and dry.  Psychiatric: She has a normal mood and affect. Her behavior is normal. Judgment and thought content normal.    Ortho Exam  Exam of her right knee reveals good stability.  She has tenderness diffusely about the knee but more along the medial border of the tibia.  She has good range of motion.  Left knee reveals mild effusion.  No warmth or erythema.  Pseudolaxity with valgus stressing but does have a good endpoint.  Tender medially more than laterally.   Crepitance with range of motion.     Specialty Comments:  No specialty comments available.  Imaging: Xr Knee 3 View Right  Result Date: 12/21/2017 Three-view x-ray of the right knee appears to have maintained the same stability.  Some lucencies under the cement mantle at the proximal tibia and along the proximal medial border of the plateau.  Some hypertrophy of the distal third of the femur medially probably secondary to the stress is placed by the long stem of the femoral component.  I do not see any stress fractures per se.    PMFS History: Patient Active Problem List   Diagnosis Date Noted  . Primary osteoarthritis of both hands 11/17/2016  . Chronic pain of right knee 10/03/2016  . History of hypertension 10/03/2016  . History of migraine 10/03/2016  . Gastroesophageal reflux disease without esophagitis 10/03/2016  . History of herpes simplex infection 10/03/2016  . History of total knee replacement, right 06/18/2014  . Other chronic postoperative pain 01/25/2014  . Osteoarthritis of left knee 01/25/2014  . Postoperative anemia due to acute blood loss 02/24/2012  . Obesity (BMI 30-39.9) 02/24/2012   Past Medical History:  Diagnosis Date  . Arthritis   . Constipation   . Family history of anesthesia complication    ?  Aunt did not wake up.  Ms Fullenwider not sure where she had surgery,l  . GERD (gastroesophageal reflux disease)    decreased since she is not taking advail  . Herpes simplex   . Hypertension    started on BP medication last Wednesday per PCP  . Migraine     2 a week  . Pneumonia         Family History  Problem Relation Age of Onset  . Hypertension Mother   . Cancer Mother   . Hypertension Father   . Asthma Sister   . Migraines Sister   . Colon cancer Neg Hx     Past Surgical History:  Procedure Laterality Date  . Fatty tumor    . KNEE ARTHROSCOPY Right   . KNEE SURGERY  02/22/2012  . KNEE SURGERY Right 06/18/2014  . TOTAL KNEE ARTHROPLASTY   02/22/2012   Procedure: TOTAL KNEE ARTHROPLASTY;  Surgeon: Garald Balding, MD;  Location: [facility];  Service: Orthopedics;  Laterality: Right;  . TOTAL KNEE REVISION Right 06/18/2014   Procedure: RIGHT TOTAL KNEE REVISION;  Surgeon: Garald Balding, MD;  Location: [facility];  Service: Orthopedics;  Laterality: Right;  . TUBAL LIGATION     Social History   Occupational History  . Not on file  Tobacco Use  . Smoking status: Never Smoker  . Smokeless tobacco: Never Used  Substance and Sexual Activity  . Alcohol use: Yes    Alcohol/week: 0.0 oz  Comment: occasional  . Drug use: No  . Sexual activity: Yes    Birth control/protection: Surgical

## 2018-01-20 ENCOUNTER — Ambulatory Visit (INDEPENDENT_AMBULATORY_CARE_PROVIDER_SITE_OTHER): Payer: Federal, State, Local not specified - PPO | Admitting: Orthopaedic Surgery

## 2018-01-20 ENCOUNTER — Encounter (INDEPENDENT_AMBULATORY_CARE_PROVIDER_SITE_OTHER): Payer: Self-pay | Admitting: Orthopaedic Surgery

## 2018-01-20 VITALS — BP 140/85 | HR 79 | Resp 18 | Ht 65.0 in | Wt 177.0 lb

## 2018-01-20 DIAGNOSIS — M1712 Unilateral primary osteoarthritis, left knee: Secondary | ICD-10-CM

## 2018-01-20 MED ORDER — METHYLPREDNISOLONE ACETATE 40 MG/ML IJ SUSP
80.0000 mg | INTRAMUSCULAR | Status: AC | PRN
  Administered 2018-01-20: 80 mg

## 2018-01-20 MED ORDER — LIDOCAINE HCL 1 % IJ SOLN
2.0000 mL | INTRAMUSCULAR | Status: AC | PRN
  Administered 2018-01-20: 2 mL

## 2018-01-20 MED ORDER — BUPIVACAINE HCL 0.5 % IJ SOLN
2.0000 mL | INTRAMUSCULAR | Status: AC | PRN
  Administered 2018-01-20: 2 mL via INTRA_ARTICULAR

## 2018-01-20 NOTE — Progress Notes (Signed)
Office Visit Note   Patient: Cheyenne Graham           Date of Birth: 1966-04-23           MRN: [id number] Visit Date: 01/20/2018              Requested by: Sandi Mariscal, [place], [place] [address] PCP: Sandi Mariscal, MD   Assessment & Plan: Visit Diagnoses:  1. Primary osteoarthritis of left knee     Plan: Presently having more problem with the arthritic left knee.  Will inject with cortisone.  Return as needed  Follow-Up Instructions: Return if symptoms worsen or fail to improve.   Orders:  No orders of the defined types were placed in this encounter.  No orders of the defined types were placed in this encounter.     Procedures: Large Joint Inj: L knee on 01/20/2018 11:13 AM Indications: pain and diagnostic evaluation Details: 25 G 1.5 in needle, anteromedial approach  Arthrogram: No  Medications: 2 mL lidocaine 1 %; 2 mL bupivacaine 0.5 %; 80 mg methylPREDNISolone acetate 40 MG/ML Procedure, treatment alternatives, risks and benefits explained, specific risks discussed. Consent was given by the patient. Patient was prepped and draped in the usual sterile fashion.       Clinical Data: No additional findings.   Subjective: Chief Complaint  Patient presents with  . Left Knee - Pain  . Follow-up    LEFT KNEE PAIN FOR 2-3 MONTHS NO INJURY. VOLTAREN TABS DID NOT HELP  [name] is been seen on a number of occasions in the past for the osteoarthritis of both knees.  She is had a right total knee replacement with subsequent revision for loosening.  She does have occasional pain in that knee that is "off-and-on".  Presently she is asymptomatic.  She also is noted to have significant osteoarthritis of her left knee.  She has been seen by Dr. Estanislado Pandy with a diagnosis of primary arthritis.  Recently seen by Dr. Burney Gauze for the arthritis of both hands and bilateral carpal tunnel syndrome.  She is considering carpal tunnel release.  Presently she is been having more  trouble with her left knee.  Last cortisone injection was over 3 months ago.  HPI  Review of Systems   Objective: Vital Signs: BP 140/85 (BP Location: Left Arm, Patient Position: Sitting, Cuff Size: Normal)   Pulse 79   Resp 18   Ht 5\' 5"  (1.651 m)   Wt 177 lb (80.3 kg)   LMP 09/14/2015 (Approximate)   BMI 29.45 kg/m   Physical Exam  Constitutional: She is oriented to person, place, and time. She appears well-developed and well-nourished.  HENT:  Mouth/Throat: Oropharynx is clear and moist.  Eyes: Pupils are equal, round, and reactive to light. EOM are normal.  Pulmonary/Chest: Effort normal.  Neurological: She is alert and oriented to person, place, and time.  Skin: Skin is warm and dry.  Psychiatric: She has a normal mood and affect. Her behavior is normal.    Ortho Exam awake alert and oriented x3 comfortable sitting.  No acute distress.  No problem referable to her right knee.  Full quick extension flexion to about 95 degrees.  He is effusion.  Knee was not hot warm or red.  No significant areas of tenderness.  Recently had some discomfort in the area of her right calf which has resolved.  Does have small effusion of the left knee with predominant medial joint discomfort.  Some patellar crepitation.  Motor exam intact bilaterally  Specialty Comments:  No specialty comments available.  Imaging: No results found.   PMFS History: Patient Active Problem List   Diagnosis Date Noted  . Primary osteoarthritis of both hands 11/17/2016  . Chronic pain of right knee 10/03/2016  . History of hypertension 10/03/2016  . History of migraine 10/03/2016  . Gastroesophageal reflux disease without esophagitis 10/03/2016  . History of herpes simplex infection 10/03/2016  . History of total knee replacement, right 06/18/2014  . Other chronic postoperative pain 01/25/2014  . Osteoarthritis of left knee 01/25/2014  . Postoperative anemia due to acute blood loss 02/24/2012  . Obesity  (BMI 30-39.9) 02/24/2012   Past Medical History:  Diagnosis Date  . Arthritis   . Constipation   . Family history of anesthesia complication    ?  Aunt did not wake up.  [name] not sure where she had surgery,l  . GERD (gastroesophageal reflux disease)    decreased since she is not taking advail  . Herpes simplex   . Hypertension    started on BP medication last Wednesday per PCP  . Migraine     2 a week  . Pneumonia         Family History  Problem Relation Age of Onset  . Hypertension Mother   . Cancer Mother   . Hypertension Father   . Asthma Sister   . Migraines Sister   . Colon cancer Neg Hx     Past Surgical History:  Procedure Laterality Date  . Fatty tumor    . KNEE ARTHROSCOPY Right   . KNEE SURGERY  02/22/2012  . KNEE SURGERY Right 06/18/2014  . TOTAL KNEE ARTHROPLASTY  02/22/2012   Procedure: TOTAL KNEE ARTHROPLASTY;  Surgeon: Garald Balding, MD;  Location: [facility];  Service: Orthopedics;  Laterality: Right;  . TOTAL KNEE REVISION Right 06/18/2014   Procedure: RIGHT TOTAL KNEE REVISION;  Surgeon: Garald Balding, MD;  Location: [facility];  Service: Orthopedics;  Laterality: Right;  . TUBAL LIGATION     Social History   Occupational History  . Not on file  Tobacco Use  . Smoking status: Never Smoker  . Smokeless tobacco: Never Used  Substance and Sexual Activity  . Alcohol use: Yes    Alcohol/week: 0.0 oz    Comment: occasional  . Drug use: No  . Sexual activity: Yes    Birth control/protection: Surgical     Garald Balding, MD   Note - This record has been created using [employer].  Chart creation errors have been sought, but may not always  have been located. Such creation errors do not reflect on  the standard of medical care.

## 2018-06-14 ENCOUNTER — Encounter: Payer: Self-pay | Admitting: *Deleted

## 2018-06-14 ENCOUNTER — Encounter (INDEPENDENT_AMBULATORY_CARE_PROVIDER_SITE_OTHER): Payer: Self-pay | Admitting: Orthopedic Surgery

## 2018-06-14 ENCOUNTER — Ambulatory Visit (INDEPENDENT_AMBULATORY_CARE_PROVIDER_SITE_OTHER): Payer: Federal, State, Local not specified - PPO | Admitting: Orthopedic Surgery

## 2018-06-14 VITALS — BP 127/89 | HR 85 | Resp 14 | Ht 65.0 in | Wt 177.0 lb

## 2018-06-14 DIAGNOSIS — M1712 Unilateral primary osteoarthritis, left knee: Secondary | ICD-10-CM | POA: Diagnosis not present

## 2018-06-14 MED ORDER — BUPIVACAINE HCL 0.5 % IJ SOLN
2.0000 mL | INTRAMUSCULAR | Status: AC | PRN
  Administered 2018-06-14: 2 mL via INTRA_ARTICULAR

## 2018-06-14 MED ORDER — METHYLPREDNISOLONE ACETATE 40 MG/ML IJ SUSP
80.0000 mg | INTRAMUSCULAR | Status: AC | PRN
  Administered 2018-06-14: 80 mg

## 2018-06-14 MED ORDER — LIDOCAINE HCL 1 % IJ SOLN
2.0000 mL | INTRAMUSCULAR | Status: AC | PRN
  Administered 2018-06-14: 2 mL

## 2018-06-14 NOTE — Progress Notes (Signed)
Office Visit Note   Patient: Cheyenne Graham           Date of Birth: 29-Jul-1966           MRN: [id number] Visit Date: 06/14/2018              Requested by: Sandi Mariscal, [place], [place] [id number] PCP: Sandi Mariscal, MD   Assessment & Plan: Visit Diagnoses:  1. Unilateral primary osteoarthritis, left knee     Plan:  #1: We did have a long discussion about options for her knee which included that of total knee replacement as well as Visco supplementation.  At this time she would like to consider corticosteroid injection and that was accomplished atraumatically. #2: Follow back up if she would like to try gel shots or total knee arthroplasty surgery.  Follow-Up Instructions: Return if symptoms worsen or fail to improve.   Orders:  No orders of the defined types were placed in this encounter.  No orders of the defined types were placed in this encounter.     Procedures: Large Joint Inj: L knee on 06/14/2018 11:53 AM Indications: pain and diagnostic evaluation Details: 25 G 1.5 in needle, anteromedial approach  Arthrogram: No  Medications: 2 mL lidocaine 1 %; 2 mL bupivacaine 0.5 %; 80 mg methylPREDNISolone acetate 40 MG/ML Procedure, treatment alternatives, risks and benefits explained, specific risks discussed. Consent was given by the patient. Patient was prepped and draped in the usual sterile fashion.       Clinical Data: No additional findings.   Subjective: Chief Complaint  Patient presents with  . Left Knee - Pain    HPI  Cheyenne Graham of is a very pleasant 52 year old African-American female who presents today with recurrence of pain secondary to her osteoarthritis.  She does have chronic pain and has had corticosteroid injections as well as Visco supplementation.  She had a right total knee arthroplasty in the past and required a revision.  She would like to still put off having a total knee replacement on the left.  Comes in today for discussion as well  as corticosteroid injection.  Denies any history of recent injury or trauma.  Has had a 50 pound weight loss.  Review of Systems  Constitutional: Positive for fatigue.  HENT: Negative for ear pain, sinus pressure and sinus pain.   Eyes: Negative for pain and redness.  Respiratory: Negative for shortness of breath, wheezing and stridor.   Cardiovascular: Negative for chest pain and leg swelling.  Gastrointestinal: Negative for abdominal pain and diarrhea.  Endocrine: Negative for polyuria.  Genitourinary: Negative for pelvic pain and urgency.  Musculoskeletal: Positive for gait problem and joint swelling.  Skin: Negative for rash.  Allergic/Immunologic: Negative for immunocompromised state.  Neurological: Positive for headaches. Negative for dizziness and numbness.  Hematological: Does not bruise/bleed easily.  Psychiatric/Behavioral: Negative for confusion. The patient is not nervous/anxious.      Objective: Vital Signs: BP 127/89 (BP Location: Right Arm, Patient Position: Sitting, Cuff Size: Normal)   Pulse 85   Resp 14   Ht 5\' 5"  (1.651 m)   LMP 09/14/2015 (Approximate)   BMI 29.45 kg/m    Physical Exam  Constitutional: She is oriented to person, place, and time. She appears well-developed and well-nourished.  HENT:  Mouth/Throat: Oropharynx is clear and moist.  Eyes: Pupils are equal, round, and reactive to light. EOM are normal.  Pulmonary/Chest: Effort normal.  Neurological: She is alert and oriented to person, place, and  time.  Skin: Skin is warm and dry.  Psychiatric: She has a normal mood and affect. Her behavior is normal.    Ortho Exam  Today examination reveals limited range of motion from about 10 degrees to 90 degrees.  She has crepitance with range of motion.  Mild effusion without warmth or erythema.  Calf is supple nontender.  Good motion of the hips without pain laterally.  Is intact distally.  Specialty Comments:  No specialty comments  available.  Imaging: No results found.   PMFS History: Current Outpatient Medications  Medication Sig Dispense Refill  . diclofenac sodium (VOLTAREN) 1 % GEL Apply 2-4 g 4 (four) times daily topically. 5 Tube 2  . ibuprofen (ADVIL,MOTRIN) 200 MG tablet Take 400 mg by mouth daily as needed.    Marland Kitchen LORazepam (ATIVAN) 0.5 MG tablet Take by mouth daily as needed.     . Multiple Vitamins-Minerals (MULTIVITAMIN WITH MINERALS) tablet Take 1 tablet by mouth daily.    . valACYclovir (VALTREX) 1000 MG tablet Take 1,000 mg as needed by mouth.     Marland Kitchen amitriptyline (ELAVIL) 25 MG tablet Take 25 mg by mouth at bedtime as needed for sleep.    Marland Kitchen atorvastatin (LIPITOR) 10 MG tablet     . cloNIDine (CATAPRES - DOSED IN MG/24 HR) 0.1 mg/24hr patch     . diclofenac (VOLTAREN) 75 MG EC tablet Take 1 tablet (75 mg total) by mouth 2 (two) times daily. With FOOD!  Do not use Advil, Aleve, etc (NSAID's) at the same time (Patient not taking: Reported on 01/20/2018) 60 tablet 0  . escitalopram (LEXAPRO) 10 MG tablet 5 mg as needed.     Marland Kitchen esomeprazole (NEXIUM) 40 MG capsule Take 40 mg by mouth daily as needed.    . gabapentin (NEURONTIN) 300 MG capsule TAKE 1 CAPSULE BY MOUTH EVERYDAY AT BEDTIME (Patient not taking: Reported on 12/21/2017) 30 capsule 1  . ibuprofen (ADVIL,MOTRIN) 200 MG tablet Take by mouth.    Marland Kitchen lisinopril-hydrochlorothiazide (PRINZIDE,ZESTORETIC) 10-12.5 MG tablet     . omeprazole (PRILOSEC) 40 MG capsule     . PREMARIN vaginal cream APPLY WITH APPLICATOR EVERY FEW DAYS AS DIRECTED  2  . simvastatin (ZOCOR) 40 MG tablet     . TROKENDI XR 200 MG CP24     . Vitamin D, Ergocalciferol, (DRISDOL) 50000 units CAPS capsule      No current facility-administered medications for this visit.     Patient Active Problem List   Diagnosis Date Noted  . Primary osteoarthritis of both hands 11/17/2016  . Chronic pain of right knee 10/03/2016  . History of hypertension 10/03/2016  . History of migraine  10/03/2016  . Gastroesophageal reflux disease without esophagitis 10/03/2016  . History of herpes simplex infection 10/03/2016  . History of total knee replacement, right 06/18/2014  . Other chronic postoperative pain 01/25/2014  . Osteoarthritis of left knee 01/25/2014  . Postoperative anemia due to acute blood loss 02/24/2012  . Obesity (BMI 30-39.9) 02/24/2012   Past Medical History:  Diagnosis Date  . Arthritis   . Constipation   . Family history of anesthesia complication    ?  Aunt did not wake up.  Ms Seawright not sure where she had surgery,l  . GERD (gastroesophageal reflux disease)    decreased since she is not taking advail  . Herpes simplex   . Hypertension    started on BP medication last Wednesday per PCP  . Migraine     2 a  week  . Pneumonia         Family History  Problem Relation Age of Onset  . Hypertension Mother   . Cancer Mother   . Hypertension Father   . Asthma Sister   . Migraines Sister   . Colon cancer Neg Hx     Past Surgical History:  Procedure Laterality Date  . Fatty tumor    . KNEE ARTHROSCOPY Right   . KNEE SURGERY  02/22/2012  . KNEE SURGERY Right 06/18/2014  . TOTAL KNEE ARTHROPLASTY  02/22/2012   Procedure: TOTAL KNEE ARTHROPLASTY;  Surgeon: Garald Balding, MD;  Location: [facility];  Service: Orthopedics;  Laterality: Right;  . TOTAL KNEE REVISION Right 06/18/2014   Procedure: RIGHT TOTAL KNEE REVISION;  Surgeon: Garald Balding, MD;  Location: [facility];  Service: Orthopedics;  Laterality: Right;  . TUBAL LIGATION     Social History   Occupational History  . Not on file  Tobacco Use  . Smoking status: Never Smoker  . Smokeless tobacco: Never Used  Substance and Sexual Activity  . Alcohol use: Yes    Alcohol/week: 0.0 standard drinks    Comment: occasional  . Drug use: No  . Sexual activity: Yes    Birth control/protection: Surgical

## 2018-09-25 ENCOUNTER — Encounter (INDEPENDENT_AMBULATORY_CARE_PROVIDER_SITE_OTHER): Payer: Self-pay | Admitting: Orthopaedic Surgery

## 2018-09-25 ENCOUNTER — Telehealth (INDEPENDENT_AMBULATORY_CARE_PROVIDER_SITE_OTHER): Payer: Self-pay | Admitting: *Deleted

## 2018-09-25 ENCOUNTER — Telehealth (INDEPENDENT_AMBULATORY_CARE_PROVIDER_SITE_OTHER): Payer: Self-pay | Admitting: Orthopaedic Surgery

## 2018-09-25 ENCOUNTER — Ambulatory Visit (INDEPENDENT_AMBULATORY_CARE_PROVIDER_SITE_OTHER): Payer: Federal, State, Local not specified - PPO | Admitting: Orthopaedic Surgery

## 2018-09-25 VITALS — BP 128/97 | HR 85

## 2018-09-25 DIAGNOSIS — M1712 Unilateral primary osteoarthritis, left knee: Secondary | ICD-10-CM

## 2018-09-25 MED ORDER — METHYLPREDNISOLONE ACETATE 40 MG/ML IJ SUSP
80.0000 mg | INTRAMUSCULAR | Status: AC | PRN
  Administered 2018-09-25: 80 mg

## 2018-09-25 MED ORDER — BUPIVACAINE HCL 0.5 % IJ SOLN
2.0000 mL | INTRAMUSCULAR | Status: AC | PRN
  Administered 2018-09-25: 2 mL via INTRA_ARTICULAR

## 2018-09-25 MED ORDER — LIDOCAINE HCL 1 % IJ SOLN
2.0000 mL | INTRAMUSCULAR | Status: AC | PRN
  Administered 2018-09-25: 2 mL

## 2018-09-25 NOTE — Progress Notes (Signed)
Office Visit Note   Patient: Cheyenne Graham           Date of Birth: 07/16/1966           MRN: [id number] Visit Date: 09/25/2018              Requested by: Sandi Mariscal, [place], [place] [address] PCP: Sandi Mariscal, MD   Assessment & Plan: Visit Diagnoses:  1. Primary osteoarthritis of left knee     Plan: Cortisone injection left knee.  Will precertify Visco supplementation.  FMLA papers completed.  I would suggest up to 10 days off a month  as needed for FMLA.  Note today to stay out of work from January 14 through today  Follow-Up Instructions: Return will pre certr visco.   Orders:  No orders of the defined types were placed in this encounter.  No orders of the defined types were placed in this encounter.     Procedures: Large Joint Inj: L knee on 09/25/2018 9:56 AM Indications: pain and diagnostic evaluation Details: 25 G 1.5 in needle, anteromedial approach  Arthrogram: No  Medications: 2 mL lidocaine 1 %; 2 mL bupivacaine 0.5 %; 80 mg methylPREDNISolone acetate 40 MG/ML Procedure, treatment alternatives, risks and benefits explained, specific risks discussed. Consent was given by the patient. Patient was prepped and draped in the usual sterile fashion.       Clinical Data: No additional findings.   Subjective: Chief Complaint  Patient presents with  . Left Knee - Follow-up, Pain  . Knee Pain    last injection 06/2018  Cheyenne Graham is having recurrent symptoms of left knee pain related to her osteoarthritis.  Her last cortisone injection was 3 months ago.  She also needs FMLA papers updated and would like to consider Visco supplementation.  She is aware of definitive treatment of knee replacement but is not ready.  She has had a prior right total knee replacement with revision and still has some discomfort on occasion  HPI  Review of Systems   Objective: Vital Signs: BP (!) 128/97   Pulse 85   LMP 09/14/2015 (Approximate)   Physical  Exam Constitutional:      Appearance: She is well-developed.  Eyes:     Pupils: Pupils are equal, round, and reactive to light.  Pulmonary:     Effort: Pulmonary effort is normal.  Skin:    General: Skin is warm and dry.  Neurological:     Mental Status: She is alert and oriented to person, place, and time.  Psychiatric:        Behavior: Behavior normal.     Ortho Exam awake alert and oriented x3.  Comfortable sitting.  Left knee with small effusion.  Predominant medial joint pain.  Lacks just a few degrees to full extension about 100 degrees of flexion without instability.  No calf pain  Specialty Comments:  No specialty comments available.  Imaging: No results found.   PMFS History: Patient Active Problem List   Diagnosis Date Noted  . Primary osteoarthritis of both hands 11/17/2016  . Chronic pain of right knee 10/03/2016  . History of hypertension 10/03/2016  . History of migraine 10/03/2016  . Gastroesophageal reflux disease without esophagitis 10/03/2016  . History of herpes simplex infection 10/03/2016  . History of total knee replacement, right 06/18/2014  . Other chronic postoperative pain 01/25/2014  . Osteoarthritis of left knee 01/25/2014  . Postoperative anemia due to acute blood loss 02/24/2012  . Obesity (  BMI 30-39.9) 02/24/2012   Past Medical History:  Diagnosis Date  . Arthritis   . Constipation   . Family history of anesthesia complication    ?  Aunt did not wake up.  Cheyenne Graham not sure where she had surgery,l  . GERD (gastroesophageal reflux disease)    decreased since she is not taking advail  . Herpes simplex   . Hypertension    started on BP medication last Wednesday per PCP  . Migraine     2 a week  . Pneumonia         Family History  Problem Relation Age of Onset  . Hypertension Mother   . Cancer Mother   . Hypertension Father   . Asthma Sister   . Migraines Sister   . Colon cancer Neg Hx     Past Surgical History:  Procedure  Laterality Date  . Fatty tumor    . KNEE ARTHROSCOPY Right   . KNEE SURGERY  02/22/2012  . KNEE SURGERY Right 06/18/2014  . TOTAL KNEE ARTHROPLASTY  02/22/2012   Procedure: TOTAL KNEE ARTHROPLASTY;  Surgeon: Garald Balding, MD;  Location: [facility];  Service: Orthopedics;  Laterality: Right;  . TOTAL KNEE REVISION Right 06/18/2014   Procedure: RIGHT TOTAL KNEE REVISION;  Surgeon: Garald Balding, MD;  Location: [facility];  Service: Orthopedics;  Laterality: Right;  . TUBAL LIGATION     Social History   Occupational History  . Not on file  Tobacco Use  . Smoking status: Never Smoker  . Smokeless tobacco: Never Used  Substance and Sexual Activity  . Alcohol use: Yes    Alcohol/week: 0.0 standard drinks    Comment: occasional  . Drug use: No  . Sexual activity: Yes    Birth control/protection: Surgical     Garald Balding, MD   Note - This record has been created using [employer].  Chart creation errors have been sought, but may not always  have been located. Such creation errors do not reflect on  the standard of medical care.

## 2018-09-25 NOTE — Telephone Encounter (Signed)
Please apply for left knee visco for Dr. Durward Fortes. Thank you.

## 2018-09-25 NOTE — Telephone Encounter (Signed)
Patient paid $25.00 in cash on 09/25/18 for form sent to [facility].

## 2018-09-28 NOTE — Telephone Encounter (Signed)
Noted  

## 2018-10-18 ENCOUNTER — Telehealth (INDEPENDENT_AMBULATORY_CARE_PROVIDER_SITE_OTHER): Payer: Self-pay

## 2018-10-18 NOTE — Telephone Encounter (Signed)
Submitted VOB for Synvisc series, left knee. 

## 2018-10-26 ENCOUNTER — Telehealth (INDEPENDENT_AMBULATORY_CARE_PROVIDER_SITE_OTHER): Payer: Self-pay

## 2018-10-26 NOTE — Telephone Encounter (Signed)
Please schedule patient an appointment with Dr. Durward Fortes for gel injection.  Thank You.  Approved for Synvisc series, left knee Buy & Bill Covered at 85% after deductible has been met. Patient will be responsible for 15% OOP. No Co-pay No PA required

## 2018-10-27 NOTE — Telephone Encounter (Signed)
LMOM for patient to call and schedule Synvisc injections for her left knee.

## 2019-07-03 ENCOUNTER — Ambulatory Visit: Payer: Federal, State, Local not specified - PPO | Admitting: Orthopaedic Surgery

## 2019-07-04 ENCOUNTER — Ambulatory Visit: Payer: Federal, State, Local not specified - PPO | Admitting: Orthopaedic Surgery

## 2019-07-12 ENCOUNTER — Other Ambulatory Visit: Payer: Self-pay

## 2019-07-12 ENCOUNTER — Ambulatory Visit: Payer: Federal, State, Local not specified - PPO | Admitting: Orthopedic Surgery

## 2019-07-12 ENCOUNTER — Encounter: Payer: Self-pay | Admitting: Orthopedic Surgery

## 2019-07-12 VITALS — Ht 64.0 in | Wt 185.0 lb

## 2019-07-12 DIAGNOSIS — M1712 Unilateral primary osteoarthritis, left knee: Secondary | ICD-10-CM

## 2019-07-12 MED ORDER — LIDOCAINE HCL 1 % IJ SOLN
2.0000 mL | INTRAMUSCULAR | Status: AC | PRN
  Administered 2019-07-12: 2 mL

## 2019-07-12 MED ORDER — BUPIVACAINE HCL 0.25 % IJ SOLN
2.0000 mL | INTRAMUSCULAR | Status: AC | PRN
  Administered 2019-07-12: 2 mL via INTRA_ARTICULAR

## 2019-07-12 MED ORDER — METHYLPREDNISOLONE ACETATE 40 MG/ML IJ SUSP
80.0000 mg | INTRAMUSCULAR | Status: AC | PRN
  Administered 2019-07-12: 80 mg via INTRA_ARTICULAR

## 2019-07-12 NOTE — Progress Notes (Signed)
Office Visit Note   Patient: Cheyenne Graham           Date of Birth: [birth year]           MRN: [id number] Visit Date: 07/12/2019              Requested by: Sandi Mariscal, [place],  [place] [id number] PCP: Sandi Mariscal, MD   Assessment & Plan: Visit Diagnoses:  1. Primary osteoarthritis of left knee     Plan:  #1: Corticosteroid injection instilled in the left knee without difficulty.  Tolerated procedure well. #2: Discussed further treatment options that of total knee replacement but unfortunately she does not have the time to have that done at this time.  Follow-Up Instructions: Return if symptoms worsen or fail to improve.   Orders:  No orders of the defined types were placed in this encounter.  No orders of the defined types were placed in this encounter.     Procedures: Large Joint Inj: L knee on 07/12/2019 12:41 PM Indications: pain and diagnostic evaluation Details: 25 G 1.5 in needle, anteromedial approach  Arthrogram: No  Medications: 2 mL lidocaine 1 %; 80 mg methylPREDNISolone acetate 40 MG/ML; 2 mL bupivacaine 0.25 % Procedure, treatment alternatives, risks and benefits explained, specific risks discussed. Consent was given by the patient. Immediately prior to procedure a time out was called to verify the correct patient, procedure, equipment, support staff and site/side marked as required. Patient was prepped and draped in the usual sterile fashion.       Clinical Data: No additional findings.   Subjective: Chief Complaint  Patient presents with  . Left Knee - Pain   HPI Patient presents today for recurrent left knee pain. She was last seen on 09/25/2018 and received a cortisone injection. She is wanting to get another one today. She is trying to push out replacement surgery as long as she can. She has difficulty sleeping and swelling that comes and goes. She works for the [employer] and is on her feet all day.     Review of Systems   Constitutional: Negative for fatigue.  HENT: Negative for ear pain.   Eyes: Negative for pain.  Respiratory: Negative for shortness of breath.   Cardiovascular: Negative for leg swelling.  Gastrointestinal: Positive for constipation. Negative for diarrhea.  Endocrine: Positive for cold intolerance. Negative for heat intolerance.  Genitourinary: Negative for difficulty urinating.  Musculoskeletal: Positive for joint swelling.  Skin: Negative for rash.  Allergic/Immunologic: Positive for food allergies.  Neurological: Negative for weakness.  Hematological: Does not bruise/bleed easily.  Psychiatric/Behavioral: Positive for sleep disturbance.     Objective: Vital Signs: Ht 5\' 4"  (1.626 m)   Wt 185 lb (83.9 kg)   LMP 09/14/2015 (Approximate)   BMI 31.76 kg/m   Physical Exam Vitals signs reviewed.  Constitutional:      Appearance: She is well-developed.  Eyes:     Pupils: Pupils are equal, round, and reactive to light.  Pulmonary:     Effort: Pulmonary effort is normal.  Skin:    General: Skin is warm and dry.  Neurological:     Mental Status: She is alert and oriented to person, place, and time.  Psychiatric:        Behavior: Behavior normal.     Ortho Exam  Today she has range of motion where she lacks a few degrees of full extension and flexes to about 90 to 95 degrees.  Crepitance with range of  motion.  Mild effusion.  No warmth erythema.  Ligamentously stable.  Calf is supple nontender.  Neurovascular intact.  Specialty Comments:  No specialty comments available.  Imaging: No results found.   PMFS History: Current Outpatient Medications  Medication Sig Dispense Refill  . amitriptyline (ELAVIL) 25 MG tablet Take 25 mg by mouth at bedtime as needed for sleep.    Marland Kitchen atorvastatin (LIPITOR) 10 MG tablet     . cloNIDine (CATAPRES - DOSED IN MG/24 HR) 0.1 mg/24hr patch     . diclofenac (VOLTAREN) 75 MG EC tablet Take 1 tablet (75 mg total) by mouth 2 (two) times  daily. With FOOD!  Do not use Advil, Aleve, etc (NSAID's) at the same time 60 tablet 0  . diclofenac sodium (VOLTAREN) 1 % GEL Apply 2-4 g 4 (four) times daily topically. 5 Tube 2  . escitalopram (LEXAPRO) 10 MG tablet 5 mg as needed.     Marland Kitchen esomeprazole (NEXIUM) 40 MG capsule Take 40 mg by mouth daily as needed.    . gabapentin (NEURONTIN) 300 MG capsule TAKE 1 CAPSULE BY MOUTH EVERYDAY AT BEDTIME 30 capsule 1  . ibuprofen (ADVIL,MOTRIN) 200 MG tablet Take 400 mg by mouth daily as needed.    Marland Kitchen ibuprofen (ADVIL,MOTRIN) 200 MG tablet Take by mouth.    Marland Kitchen LINZESS 145 MCG CAPS capsule Take 145 mcg by mouth daily.    Marland Kitchen lisinopril-hydrochlorothiazide (PRINZIDE,ZESTORETIC) 10-12.5 MG tablet     . LORazepam (ATIVAN) 0.5 MG tablet Take by mouth daily as needed.     . Multiple Vitamins-Minerals (MULTIVITAMIN WITH MINERALS) tablet Take 1 tablet by mouth daily.    Marland Kitchen omeprazole (PRILOSEC) 40 MG capsule     . PREMARIN vaginal cream APPLY WITH APPLICATOR EVERY FEW DAYS AS DIRECTED  2  . simvastatin (ZOCOR) 40 MG tablet     . TROKENDI XR 200 MG CP24     . valACYclovir (VALTREX) 1000 MG tablet Take 1,000 mg as needed by mouth.     . Vitamin D, Ergocalciferol, (DRISDOL) 50000 units CAPS capsule     . zolpidem (AMBIEN) 10 MG tablet Take 10 mg by mouth at bedtime.     No current facility-administered medications for this visit.     Patient Active Problem List   Diagnosis Date Noted  . Primary osteoarthritis of both hands 11/17/2016  . Chronic pain of right knee 10/03/2016  . History of hypertension 10/03/2016  . History of migraine 10/03/2016  . Gastroesophageal reflux disease without esophagitis 10/03/2016  . History of herpes simplex infection 10/03/2016  . History of total knee replacement, right 06/18/2014  . Other chronic postoperative pain 01/25/2014  . Osteoarthritis of left knee 01/25/2014  . Postoperative anemia due to acute blood loss 02/24/2012  . Obesity (BMI 30-39.9) 02/24/2012   Past  Medical History:  Diagnosis Date  . Arthritis   . Constipation   . Family history of anesthesia complication    ?  Aunt did not wake up.  Ms Goggins not sure where she had surgery,l  . GERD (gastroesophageal reflux disease)    decreased since she is not taking advail  . Herpes simplex   . Hypertension    started on BP medication last Wednesday per PCP  . Migraine     2 a week  . Pneumonia         Family History  Problem Relation Age of Onset  . Hypertension Mother   . Cancer Mother   . Hypertension Father   .  Asthma Sister   . Migraines Sister   . Colon cancer Neg Hx     Past Surgical History:  Procedure Laterality Date  . Fatty tumor    . KNEE ARTHROSCOPY Right   . KNEE SURGERY  02/22/2012  . KNEE SURGERY Right 06/18/2014  . TOTAL KNEE ARTHROPLASTY  02/22/2012   Procedure: TOTAL KNEE ARTHROPLASTY;  Surgeon: Garald Balding, MD;  Location: [facility];  Service: Orthopedics;  Laterality: Right;  . TOTAL KNEE REVISION Right 06/18/2014   Procedure: RIGHT TOTAL KNEE REVISION;  Surgeon: Garald Balding, MD;  Location: [facility];  Service: Orthopedics;  Laterality: Right;  . TUBAL LIGATION     Social History   Occupational History  . Not on file  Tobacco Use  . Smoking status: Never Smoker  . Smokeless tobacco: Never Used  Substance and Sexual Activity  . Alcohol use: Yes    Alcohol/week: 0.0 standard drinks    Comment: occasional  . Drug use: No  . Sexual activity: Yes    Birth control/protection: Surgical

## 2019-09-20 ENCOUNTER — Ambulatory Visit: Payer: Federal, State, Local not specified - PPO | Admitting: Orthopaedic Surgery

## 2019-09-26 ENCOUNTER — Encounter: Payer: Self-pay | Admitting: Orthopaedic Surgery

## 2019-09-26 ENCOUNTER — Telehealth: Payer: Self-pay | Admitting: Orthopaedic Surgery

## 2019-09-26 ENCOUNTER — Ambulatory Visit (INDEPENDENT_AMBULATORY_CARE_PROVIDER_SITE_OTHER): Payer: Federal, State, Local not specified - PPO | Admitting: Orthopaedic Surgery

## 2019-09-26 ENCOUNTER — Other Ambulatory Visit: Payer: Self-pay

## 2019-09-26 VITALS — Ht 65.0 in | Wt 182.0 lb

## 2019-09-26 DIAGNOSIS — M1712 Unilateral primary osteoarthritis, left knee: Secondary | ICD-10-CM | POA: Diagnosis not present

## 2019-09-26 MED ORDER — METHYLPREDNISOLONE ACETATE 40 MG/ML IJ SUSP
80.0000 mg | INTRAMUSCULAR | Status: AC | PRN
  Administered 2019-09-26: 12:00:00 80 mg via INTRA_ARTICULAR

## 2019-09-26 MED ORDER — LIDOCAINE HCL 1 % IJ SOLN
2.0000 mL | INTRAMUSCULAR | Status: AC | PRN
  Administered 2019-09-26: 12:00:00 2 mL

## 2019-09-26 MED ORDER — BUPIVACAINE HCL 0.5 % IJ SOLN
2.0000 mL | INTRAMUSCULAR | Status: AC | PRN
  Administered 2019-09-26: 12:00:00 2 mL via INTRA_ARTICULAR

## 2019-09-26 NOTE — Telephone Encounter (Signed)
Please get preauth for Left knee visco injections. This is Dr.Whitfield's patient.

## 2019-09-26 NOTE — Progress Notes (Signed)
Office Visit Note   Patient: Cheyenne Graham           Date of Birth: 1966-08-03           MRN: [id number] Visit Date: 09/26/2019              Requested by: Sandi Mariscal, [place],  [place] [address] PCP: Sandi Mariscal, MD   Assessment & Plan: Visit Diagnoses:  1. Primary osteoarthritis of left knee     Plan: Recurrent symptoms of osteoarthritis left knee.  Prior films demonstrate advanced osteoarthritis.  Will reinject with cortisone and pre-CERT for Visco supplementation.  Has had a long history of problems with her left knee but is hesitant to consider knee replacement. This patient is diagnosed with osteoarthritis of the knee(s).    Radiographs show evidence of joint space narrowing, osteophytes, subchondral sclerosis and/or subchondral cysts.  This patient has knee pain which interferes with functional and activities of daily living.    This patient has experienced inadequate response, adverse effects and/or intolerance with conservative treatments such as acetaminophen, NSAIDS, topical creams, physical therapy or regular exercise, knee bracing and/or weight loss.   This patient has experienced inadequate response or has a contraindication to intra articular steroid injections for at least 3 months.   This patient is not scheduled to have a total knee replacement within 6 months of starting treatment with viscosupplementation.   Follow-Up Instructions: Return if symptoms worsen or fail to improve.   Orders:  Orders Placed This Encounter  Procedures  . Large Joint Inj: L knee   No orders of the defined types were placed in this encounter.     Procedures: Large Joint Inj: L knee on 09/26/2019 11:47 AM Indications: pain and diagnostic evaluation Details: 25 G 1.5 in needle, anteromedial approach  Arthrogram: No  Medications: 2 mL lidocaine 1 %; 2 mL bupivacaine 0.5 %; 80 mg methylPREDNISolone acetate 40 MG/ML Procedure, treatment alternatives, risks and  benefits explained, specific risks discussed. Consent was given by the patient. Patient was prepped and draped in the usual sterile fashion.       Clinical Data: No additional findings.   Subjective: Chief Complaint  Patient presents with  . Left Knee - Pain  Patient presents today for follow up on her left knee pain. She was last here in November of 2020 and received a cortisone injection. She said that the injection helped "somewhat". She said that her knee burns and pops. She is taking Advil for pain. Her right knee is painful. She had aright total knee arthroplasty in 2013, followed by a revision in 2015. She said that she can barely bend her right knee today. She wonders if her right knee is hurting more due to compensating for the left knee.  Prior films of the left knee were performed in 2019 demonstrating bone-on-bone in the medial compartment  HPI  Review of Systems   Objective: Vital Signs: Ht 5\' 5"  (1.651 m)   Wt 182 lb (82.6 kg)   LMP 09/14/2015 (Approximate)   BMI 30.29 kg/m   Physical Exam Constitutional:      Appearance: She is well-developed.  Eyes:     Pupils: Pupils are equal, round, and reactive to light.  Pulmonary:     Effort: Pulmonary effort is normal.  Skin:    General: Skin is warm and dry.  Neurological:     Mental Status: She is alert and oriented to person, place, and time.  Psychiatric:  Behavior: Behavior normal.     Ortho Exam left knee was not hot red warm or swollen.  No effusion.  Does have increased varus with weightbearing.  Full extension about 100 degrees of flexion.  No instability.  Some popliteal fullness but no pain.  No calf discomfort.  Motor exam intact  Specialty Comments:  No specialty comments available.  Imaging: No results found.   PMFS History: Patient Active Problem List   Diagnosis Date Noted  . Primary osteoarthritis of both hands 11/17/2016  . Chronic pain of right knee 10/03/2016  . History of  hypertension 10/03/2016  . History of migraine 10/03/2016  . Gastroesophageal reflux disease without esophagitis 10/03/2016  . History of herpes simplex infection 10/03/2016  . History of total knee replacement, right 06/18/2014  . Other chronic postoperative pain 01/25/2014  . Osteoarthritis of left knee 01/25/2014  . Postoperative anemia due to acute blood loss 02/24/2012  . Obesity (BMI 30-39.9) 02/24/2012   Past Medical History:  Diagnosis Date  . Arthritis   . Constipation   . Family history of anesthesia complication    ?  Aunt did not wake up.  Ms Sinon not sure where she had surgery,l  . GERD (gastroesophageal reflux disease)    decreased since she is not taking advail  . Herpes simplex   . Hypertension    started on BP medication last Wednesday per PCP  . Migraine     2 a week  . Pneumonia         Family History  Problem Relation Age of Onset  . Hypertension Mother   . Cancer Mother   . Hypertension Father   . Asthma Sister   . Migraines Sister   . Colon cancer Neg Hx     Past Surgical History:  Procedure Laterality Date  . Fatty tumor    . KNEE ARTHROSCOPY Right   . KNEE SURGERY  02/22/2012  . KNEE SURGERY Right 06/18/2014  . TOTAL KNEE ARTHROPLASTY  02/22/2012   Procedure: TOTAL KNEE ARTHROPLASTY;  Surgeon: Garald Balding, MD;  Location: [facility];  Service: Orthopedics;  Laterality: Right;  . TOTAL KNEE REVISION Right 06/18/2014   Procedure: RIGHT TOTAL KNEE REVISION;  Surgeon: Garald Balding, MD;  Location: [facility];  Service: Orthopedics;  Laterality: Right;  . TUBAL LIGATION     Social History   Occupational History  . Not on file  Tobacco Use  . Smoking status: Never Smoker  . Smokeless tobacco: Never Used  Substance and Sexual Activity  . Alcohol use: Yes    Alcohol/week: 0.0 standard drinks    Comment: occasional  . Drug use: No  . Sexual activity: Yes    Birth control/protection: Surgical

## 2019-09-27 NOTE — Telephone Encounter (Signed)
Noted  

## 2019-09-28 ENCOUNTER — Telehealth: Payer: Self-pay

## 2019-09-28 NOTE — Telephone Encounter (Signed)
Submitted VOB for Synvisc series, left knee.

## 2019-10-15 ENCOUNTER — Telehealth: Payer: Self-pay

## 2019-10-15 NOTE — Telephone Encounter (Signed)
Called and left a VM advising patient to call back to schedule an appointment for gel injection with Dr. Durward Fortes.  Approved for Synvisc series, left knee. Buy & Bill Must meet deductible first Patient will be responsible for 15% OOP. Co-pay of $35.00 No PA required

## 2019-11-09 ENCOUNTER — Ambulatory Visit: Payer: Federal, State, Local not specified - PPO | Attending: Internal Medicine

## 2019-11-09 DIAGNOSIS — Z23 Encounter for immunization: Secondary | ICD-10-CM | POA: Insufficient documentation

## 2019-11-09 NOTE — Progress Notes (Signed)
   Covid-19 Vaccination Clinic  Name:  Cheyenne Graham    MRN: [id number] DOB: 1965-12-10  11/09/2019  Cheyenne Graham was observed post Covid-19 immunization for 15 minutes without incident. She was provided with Vaccine Information Sheet and instruction to access the V-Safe system.   Cheyenne Graham was instructed to call 911 with any severe reactions post vaccine: Marland Kitchen Difficulty breathing  . Swelling of face and throat  . A fast heartbeat  . A bad rash all over body  . Dizziness and weakness

## 2019-11-15 ENCOUNTER — Ambulatory Visit: Payer: Federal, State, Local not specified - PPO

## 2019-11-20 ENCOUNTER — Ambulatory Visit: Payer: Federal, State, Local not specified - PPO | Admitting: Orthopaedic Surgery

## 2019-11-27 ENCOUNTER — Other Ambulatory Visit: Payer: Self-pay

## 2019-11-27 ENCOUNTER — Ambulatory Visit: Payer: Federal, State, Local not specified - PPO | Admitting: Orthopaedic Surgery

## 2019-11-27 ENCOUNTER — Encounter: Payer: Self-pay | Admitting: Orthopaedic Surgery

## 2019-11-27 VITALS — Ht 65.0 in | Wt 182.0 lb

## 2019-11-27 DIAGNOSIS — M1712 Unilateral primary osteoarthritis, left knee: Secondary | ICD-10-CM

## 2019-11-27 MED ORDER — LIDOCAINE HCL 1 % IJ SOLN
2.0000 mL | INTRAMUSCULAR | Status: AC | PRN
  Administered 2019-11-27: 2 mL

## 2019-11-27 MED ORDER — HYLAN G-F 20 16 MG/2ML IX SOSY
16.0000 mg | PREFILLED_SYRINGE | INTRA_ARTICULAR | Status: AC | PRN
  Administered 2019-11-27: 16 mg via INTRA_ARTICULAR

## 2019-11-27 NOTE — Progress Notes (Signed)
Office Visit Note   Patient: Cheyenne Graham           Date of Birth: 05-Jul-1966           MRN: [id number] Visit Date: 11/27/2019              Requested by: Sandi Mariscal, [place],  [place] [address] PCP: Sandi Mariscal, MD   Assessment & Plan: Visit Diagnoses:  1. Primary osteoarthritis of left knee     Plan: #1: Synvisc injection was given to the left knee without difficulty.  Tolerated the procedure well. #2: Follow back up in 1 week for her final third injection.  Follow-Up Instructions: Return in about 1 week (around 12/04/2019).   Orders:  Orders Placed This Encounter  Procedures  . Large Joint Inj: L knee   No orders of the defined types were placed in this encounter.     Procedures: Large Joint Inj: L knee on 11/27/2019 2:06 PM Indications: pain and joint swelling Details: 25 G 1.5 in needle, anteromedial approach  Arthrogram: No  Medications: 2 mL lidocaine 1 %; 16 mg Hylan 16 MG/2ML Outcome: tolerated well, no immediate complications Procedure, treatment alternatives, risks and benefits explained, specific risks discussed. Consent was given by the patient. Immediately prior to procedure a time out was called to verify the correct patient, procedure, equipment, support staff and site/side marked as required. Patient was prepped and draped in the usual sterile fashion.       Clinical Data: No additional findings.   Subjective: Chief Complaint  Patient presents with  . Left Knee - Pain  Patient presents today for her left knee. She is here to start the Synvisc injections.   HPI  Review of Systems  Constitutional: Negative for fatigue.  HENT: Negative for ear pain.   Eyes: Negative for pain.  Respiratory: Negative for shortness of breath.   Cardiovascular: Negative for leg swelling.  Gastrointestinal: Negative for constipation and diarrhea.  Endocrine: Negative for cold intolerance and heat intolerance.  Genitourinary: Negative for  difficulty urinating.  Musculoskeletal: Negative for joint swelling.  Skin: Negative for rash.  Allergic/Immunologic: Positive for food allergies.  Neurological: Negative for weakness.  Hematological: Does not bruise/bleed easily.  Psychiatric/Behavioral: Positive for sleep disturbance.     Objective: Vital Signs: Ht 5\' 5"  (1.651 m)   Wt 182 lb (82.6 kg)   LMP 09/14/2015 (Approximate)   BMI 30.29 kg/m   Physical Exam  Ortho Exam  Exam today reveals what appears to be no reactivity to her last injection.  Specialty Comments:  No specialty comments available.  Imaging: No results found.   PMFS History: Current Outpatient Medications  Medication Sig Dispense Refill  . amitriptyline (ELAVIL) 25 MG tablet Take 25 mg by mouth at bedtime as needed for sleep.    Marland Kitchen atorvastatin (LIPITOR) 10 MG tablet     . cloNIDine (CATAPRES - DOSED IN MG/24 HR) 0.1 mg/24hr patch     . diclofenac (VOLTAREN) 75 MG EC tablet Take 1 tablet (75 mg total) by mouth 2 (two) times daily. With FOOD!  Do not use Advil, Aleve, etc (NSAID's) at the same time 60 tablet 0  . diclofenac sodium (VOLTAREN) 1 % GEL Apply 2-4 g 4 (four) times daily topically. 5 Tube 2  . escitalopram (LEXAPRO) 10 MG tablet 5 mg as needed.     Marland Kitchen esomeprazole (NEXIUM) 40 MG capsule Take 40 mg by mouth daily as needed.    . gabapentin (NEURONTIN)  300 MG capsule TAKE 1 CAPSULE BY MOUTH EVERYDAY AT BEDTIME 30 capsule 1  . ibuprofen (ADVIL,MOTRIN) 200 MG tablet Take 400 mg by mouth daily as needed.    Marland Kitchen ibuprofen (ADVIL,MOTRIN) 200 MG tablet Take by mouth.    Marland Kitchen LINZESS 145 MCG CAPS capsule Take 145 mcg by mouth daily.    Marland Kitchen lisinopril-hydrochlorothiazide (PRINZIDE,ZESTORETIC) 10-12.5 MG tablet     . LORazepam (ATIVAN) 0.5 MG tablet Take by mouth daily as needed.     . Multiple Vitamins-Minerals (MULTIVITAMIN WITH MINERALS) tablet Take 1 tablet by mouth daily.    Marland Kitchen omeprazole (PRILOSEC) 40 MG capsule     . PREMARIN vaginal cream APPLY  WITH APPLICATOR EVERY FEW DAYS AS DIRECTED  2  . simvastatin (ZOCOR) 40 MG tablet     . TROKENDI XR 200 MG CP24     . valACYclovir (VALTREX) 1000 MG tablet Take 1,000 mg as needed by mouth.     . Vitamin D, Ergocalciferol, (DRISDOL) 50000 units CAPS capsule     . zolpidem (AMBIEN) 10 MG tablet Take 10 mg by mouth at bedtime.     No current facility-administered medications for this visit.    Patient Active Problem List   Diagnosis Date Noted  . Primary osteoarthritis of both hands 11/17/2016  . Chronic pain of right knee 10/03/2016  . History of hypertension 10/03/2016  . History of migraine 10/03/2016  . Gastroesophageal reflux disease without esophagitis 10/03/2016  . History of herpes simplex infection 10/03/2016  . History of total knee replacement, right 06/18/2014  . Other chronic postoperative pain 01/25/2014  . Osteoarthritis of left knee 01/25/2014  . Postoperative anemia due to acute blood loss 02/24/2012  . Obesity (BMI 30-39.9) 02/24/2012   Past Medical History:  Diagnosis Date  . Arthritis   . Constipation   . Family history of anesthesia complication    ?  Aunt did not wake up.  Ms Elbon not sure where she had surgery,l  . GERD (gastroesophageal reflux disease)    decreased since she is not taking advail  . Herpes simplex   . Hypertension    started on BP medication last Wednesday per PCP  . Migraine     2 a week  . Pneumonia         Family History  Problem Relation Age of Onset  . Hypertension Mother   . Cancer Mother   . Hypertension Father   . Asthma Sister   . Migraines Sister   . Colon cancer Neg Hx     Past Surgical History:  Procedure Laterality Date  . Fatty tumor    . KNEE ARTHROSCOPY Right   . KNEE SURGERY  02/22/2012  . KNEE SURGERY Right 06/18/2014  . TOTAL KNEE ARTHROPLASTY  02/22/2012   Procedure: TOTAL KNEE ARTHROPLASTY;  Surgeon: Garald Balding, MD;  Location: [facility];  Service: Orthopedics;  Laterality: Right;  . TOTAL KNEE  REVISION Right 06/18/2014   Procedure: RIGHT TOTAL KNEE REVISION;  Surgeon: Garald Balding, MD;  Location: [facility];  Service: Orthopedics;  Laterality: Right;  . TUBAL LIGATION     Social History   Occupational History  . Not on file  Tobacco Use  . Smoking status: Never Smoker  . Smokeless tobacco: Never Used  Substance and Sexual Activity  . Alcohol use: Yes    Alcohol/week: 0.0 standard drinks    Comment: occasional  . Drug use: No  . Sexual activity: Yes    Birth control/protection: Surgical

## 2019-12-03 ENCOUNTER — Ambulatory Visit: Payer: Federal, State, Local not specified - PPO | Attending: Internal Medicine

## 2019-12-03 DIAGNOSIS — Z23 Encounter for immunization: Secondary | ICD-10-CM

## 2019-12-03 NOTE — Progress Notes (Signed)
   Covid-19 Vaccination Clinic  Name:  Cheyenne Graham    MRN: [id number] DOB: 12-03-1965  12/03/2019  [name] was observed post Covid-19 immunization for 15 minutes without incident. She was provided with Vaccine Information Sheet and instruction to access the V-Safe system.   [name] was instructed to call 911 with any severe reactions post vaccine: Marland Kitchen Difficulty breathing  . Swelling of face and throat  . A fast heartbeat  . A bad rash all over body  . Dizziness and weakness   Immunizations Administered    Name Date Dose VIS Date Route   Pfizer COVID-19 Vaccine 12/03/2019  8:46 AM 0.3 mL 08/17/2019 Intramuscular   Manufacturer: [place]   Lot: [id number]   [place]: [id number]

## 2019-12-04 ENCOUNTER — Encounter: Payer: Self-pay | Admitting: Orthopaedic Surgery

## 2019-12-04 ENCOUNTER — Ambulatory Visit: Payer: Federal, State, Local not specified - PPO | Admitting: Orthopaedic Surgery

## 2019-12-04 ENCOUNTER — Other Ambulatory Visit: Payer: Self-pay

## 2019-12-04 DIAGNOSIS — M1712 Unilateral primary osteoarthritis, left knee: Secondary | ICD-10-CM

## 2019-12-04 MED ORDER — LIDOCAINE HCL 1 % IJ SOLN
1.0000 mL | INTRAMUSCULAR | Status: AC | PRN
  Administered 2019-12-04: 1 mL

## 2019-12-04 MED ORDER — HYLAN G-F 20 16 MG/2ML IX SOSY
16.0000 mg | PREFILLED_SYRINGE | INTRA_ARTICULAR | Status: AC | PRN
  Administered 2019-12-04: 16 mg via INTRA_ARTICULAR

## 2019-12-04 NOTE — Progress Notes (Signed)
Office Visit Note   Patient: Cheyenne Graham           Date of Birth: Oct 18, 1965           MRN: [id number] Visit Date: 12/04/2019              Requested by: Sandi Mariscal, [place],  [place] [address] PCP: Sandi Mariscal, MD   Assessment & Plan: Visit Diagnoses:  1. Primary osteoarthritis of left knee     Plan:  #1: Third Synvisc injection was given without difficulty. #2: Follow back up as needed.    Follow-Up Instructions: Return if symptoms worsen or fail to improve.   Orders:  No orders of the defined types were placed in this encounter.  No orders of the defined types were placed in this encounter.     Procedures: Large Joint Inj: L knee on 12/04/2019 2:31 PM Indications: pain and joint swelling Details: 25 G 1.5 in needle, anteromedial approach  Arthrogram: No  Medications: 1 mL lidocaine 1 %; 16 mg Hylan 16 MG/2ML Outcome: tolerated well, no immediate complications Procedure, treatment alternatives, risks and benefits explained, specific risks discussed. Consent was given by the patient. Immediately prior to procedure a time out was called to verify the correct patient, procedure, equipment, support staff and site/side marked as required. Patient was prepped and draped in the usual sterile fashion.       Clinical Data: No additional findings.   Subjective: Chief Complaint  Patient presents with  . Left Knee - Follow-up   HPI Patient presents today for the second Synvisc injection in her left knee. She started the series on 11/27/2019. She states that she is hurting more since the injection.    Review of Systems  Constitutional: Positive for fatigue.  HENT: Negative for ear pain.   Eyes: Negative for pain.  Respiratory: Negative for shortness of breath.   Cardiovascular: Negative for leg swelling.  Gastrointestinal: Positive for constipation and diarrhea.  Endocrine: Negative for cold intolerance and heat intolerance.  Genitourinary:  Negative for difficulty urinating.  Musculoskeletal: Negative for joint swelling.  Allergic/Immunologic: Positive for food allergies.  Neurological: Negative for weakness.  Hematological: Does not bruise/bleed easily.  Psychiatric/Behavioral: Positive for sleep disturbance.     Objective: Vital Signs: LMP 09/14/2015 (Approximate)   Physical Exam Constitutional:      Appearance: She is well-developed.  Eyes:     Pupils: Pupils are equal, round, and reactive to light.  Pulmonary:     Effort: Pulmonary effort is normal.  Skin:    General: Skin is warm and dry.  Neurological:     Mental Status: She is alert and oriented to person, place, and time.  Psychiatric:        Behavior: Behavior normal.     Ortho Exam  Exam today reveals a mild effusion.  No warmth or erythema.  No ecchymosis noted.  Specialty Comments:  No specialty comments available.  Imaging: No results found.   PMFS History: Current Outpatient Medications  Medication Sig Dispense Refill  . amitriptyline (ELAVIL) 25 MG tablet Take 25 mg by mouth at bedtime as needed for sleep.    Marland Kitchen atorvastatin (LIPITOR) 10 MG tablet     . cloNIDine (CATAPRES - DOSED IN MG/24 HR) 0.1 mg/24hr patch     . diclofenac (VOLTAREN) 75 MG EC tablet Take 1 tablet (75 mg total) by mouth 2 (two) times daily. With FOOD!  Do not use Advil, Aleve, etc (NSAID's) at the  same time 60 tablet 0  . diclofenac sodium (VOLTAREN) 1 % GEL Apply 2-4 g 4 (four) times daily topically. 5 Tube 2  . escitalopram (LEXAPRO) 10 MG tablet 5 mg as needed.     Marland Kitchen esomeprazole (NEXIUM) 40 MG capsule Take 40 mg by mouth daily as needed.    . gabapentin (NEURONTIN) 300 MG capsule TAKE 1 CAPSULE BY MOUTH EVERYDAY AT BEDTIME 30 capsule 1  . ibuprofen (ADVIL,MOTRIN) 200 MG tablet Take 400 mg by mouth daily as needed.    Marland Kitchen ibuprofen (ADVIL,MOTRIN) 200 MG tablet Take by mouth.    Marland Kitchen LINZESS 145 MCG CAPS capsule Take 145 mcg by mouth daily.    Marland Kitchen  lisinopril-hydrochlorothiazide (PRINZIDE,ZESTORETIC) 10-12.5 MG tablet     . LORazepam (ATIVAN) 0.5 MG tablet Take by mouth daily as needed.     . Multiple Vitamins-Minerals (MULTIVITAMIN WITH MINERALS) tablet Take 1 tablet by mouth daily.    Marland Kitchen omeprazole (PRILOSEC) 40 MG capsule     . PREMARIN vaginal cream APPLY WITH APPLICATOR EVERY FEW DAYS AS DIRECTED  2  . simvastatin (ZOCOR) 40 MG tablet     . TROKENDI XR 200 MG CP24     . valACYclovir (VALTREX) 1000 MG tablet Take 1,000 mg as needed by mouth.     . Vitamin D, Ergocalciferol, (DRISDOL) 50000 units CAPS capsule     . zolpidem (AMBIEN) 10 MG tablet Take 10 mg by mouth at bedtime.     No current facility-administered medications for this visit.    Patient Active Problem List   Diagnosis Date Noted  . Primary osteoarthritis of both hands 11/17/2016  . Chronic pain of right knee 10/03/2016  . History of hypertension 10/03/2016  . History of migraine 10/03/2016  . Gastroesophageal reflux disease without esophagitis 10/03/2016  . History of herpes simplex infection 10/03/2016  . History of total knee replacement, right 06/18/2014  . Other chronic postoperative pain 01/25/2014  . Osteoarthritis of left knee 01/25/2014  . Postoperative anemia due to acute blood loss 02/24/2012  . Obesity (BMI 30-39.9) 02/24/2012   Past Medical History:  Diagnosis Date  . Arthritis   . Constipation   . Family history of anesthesia complication    ?  Aunt did not wake up.  Ms Reinhold not sure where she had surgery,l  . GERD (gastroesophageal reflux disease)    decreased since she is not taking advail  . Herpes simplex   . Hypertension    started on BP medication last Wednesday per PCP  . Migraine     2 a week  . Pneumonia         Family History  Problem Relation Age of Onset  . Hypertension Mother   . Cancer Mother   . Hypertension Father   . Asthma Sister   . Migraines Sister   . Colon cancer Neg Hx     Past Surgical History:    Procedure Laterality Date  . Fatty tumor    . KNEE ARTHROSCOPY Right   . KNEE SURGERY  02/22/2012  . KNEE SURGERY Right 06/18/2014  . TOTAL KNEE ARTHROPLASTY  02/22/2012   Procedure: TOTAL KNEE ARTHROPLASTY;  Surgeon: Garald Balding, MD;  Location: [facility];  Service: Orthopedics;  Laterality: Right;  . TOTAL KNEE REVISION Right 06/18/2014   Procedure: RIGHT TOTAL KNEE REVISION;  Surgeon: Garald Balding, MD;  Location: [facility];  Service: Orthopedics;  Laterality: Right;  . TUBAL LIGATION     Social History  Occupational History  . Not on file  Tobacco Use  . Smoking status: Never Smoker  . Smokeless tobacco: Never Used  Substance and Sexual Activity  . Alcohol use: Yes    Alcohol/week: 0.0 standard drinks    Comment: occasional  . Drug use: No  . Sexual activity: Yes    Birth control/protection: Surgical

## 2019-12-10 ENCOUNTER — Ambulatory Visit: Payer: Federal, State, Local not specified - PPO

## 2019-12-13 ENCOUNTER — Other Ambulatory Visit: Payer: Self-pay

## 2019-12-13 ENCOUNTER — Ambulatory Visit (INDEPENDENT_AMBULATORY_CARE_PROVIDER_SITE_OTHER): Payer: Federal, State, Local not specified - PPO | Admitting: Orthopaedic Surgery

## 2019-12-13 ENCOUNTER — Encounter: Payer: Self-pay | Admitting: Orthopaedic Surgery

## 2019-12-13 VITALS — Ht 65.0 in | Wt 182.0 lb

## 2019-12-13 DIAGNOSIS — M1712 Unilateral primary osteoarthritis, left knee: Secondary | ICD-10-CM | POA: Diagnosis not present

## 2019-12-13 MED ORDER — HYLAN G-F 20 16 MG/2ML IX SOSY
16.0000 mg | PREFILLED_SYRINGE | INTRA_ARTICULAR | Status: AC | PRN
  Administered 2019-12-13: 16 mg via INTRA_ARTICULAR

## 2019-12-13 MED ORDER — LIDOCAINE HCL 1 % IJ SOLN
2.0000 mL | INTRAMUSCULAR | Status: AC | PRN
  Administered 2019-12-13: 2 mL

## 2019-12-13 NOTE — Progress Notes (Signed)
Office Visit Note   Patient: Cheyenne Graham           Date of Birth: 1965/11/29           MRN: [id number] Visit Date: 12/13/2019              Requested by: Sandi Mariscal, [place],  [place] [address] PCP: Sandi Mariscal, MD   Assessment & Plan: Visit Diagnoses:  1. Primary osteoarthritis of left knee     Plan:  #1: The final Synvisc injection was given without difficulty today. #2: Follow back up as needed.  Follow-Up Instructions: No follow-ups on file.   Orders:  No orders of the defined types were placed in this encounter.  No orders of the defined types were placed in this encounter.     Procedures: Large Joint Inj: L knee on 12/13/2019 2:54 PM Indications: pain and joint swelling Details: 25 G 1.5 in needle, anteromedial approach  Arthrogram: No  Medications: 2 mL lidocaine 1 %; 16 mg Hylan 16 MG/2ML Outcome: tolerated well, no immediate complications Procedure, treatment alternatives, risks and benefits explained, specific risks discussed. Consent was given by the patient. Immediately prior to procedure a time out was called to verify the correct patient, procedure, equipment, support staff and site/side marked as required. Patient was prepped and draped in the usual sterile fashion.       Clinical Data: No additional findings.   Subjective: Chief Complaint  Patient presents with  . Left Knee - Pain    Synvisc started 11/27/2019  Patient presents today for third left knee synvisc injection. She is doing well. She states that she has not noticed any improvement thus far.   HPI  Review of Systems   Objective: Vital Signs: Ht 5\' 5"  (1.651 m)   Wt 182 lb (82.6 kg)   LMP 09/14/2015 (Approximate)   BMI 30.29 kg/m   Physical Exam  Ortho Exam Exam today reveals the knees to be quite benign.  No reactivity.   Specialty Comments:  No specialty comments available.  Imaging: No results found.   PMFS History: Current Outpatient  Medications  Medication Sig Dispense Refill  . amitriptyline (ELAVIL) 25 MG tablet Take 25 mg by mouth at bedtime as needed for sleep.    Marland Kitchen atorvastatin (LIPITOR) 10 MG tablet     . cloNIDine (CATAPRES - DOSED IN MG/24 HR) 0.1 mg/24hr patch     . diclofenac (VOLTAREN) 75 MG EC tablet Take 1 tablet (75 mg total) by mouth 2 (two) times daily. With FOOD!  Do not use Advil, Aleve, etc (NSAID's) at the same time 60 tablet 0  . diclofenac sodium (VOLTAREN) 1 % GEL Apply 2-4 g 4 (four) times daily topically. 5 Tube 2  . escitalopram (LEXAPRO) 10 MG tablet 5 mg as needed.     Marland Kitchen esomeprazole (NEXIUM) 40 MG capsule Take 40 mg by mouth daily as needed.    . gabapentin (NEURONTIN) 300 MG capsule TAKE 1 CAPSULE BY MOUTH EVERYDAY AT BEDTIME 30 capsule 1  . ibuprofen (ADVIL,MOTRIN) 200 MG tablet Take 400 mg by mouth daily as needed.    Marland Kitchen ibuprofen (ADVIL,MOTRIN) 200 MG tablet Take by mouth.    Marland Kitchen LINZESS 145 MCG CAPS capsule Take 145 mcg by mouth daily.    Marland Kitchen lisinopril-hydrochlorothiazide (PRINZIDE,ZESTORETIC) 10-12.5 MG tablet     . LORazepam (ATIVAN) 0.5 MG tablet Take by mouth daily as needed.     . Multiple Vitamins-Minerals (MULTIVITAMIN WITH MINERALS) tablet  Take 1 tablet by mouth daily.    Marland Kitchen omeprazole (PRILOSEC) 40 MG capsule     . PREMARIN vaginal cream APPLY WITH APPLICATOR EVERY FEW DAYS AS DIRECTED  2  . simvastatin (ZOCOR) 40 MG tablet     . TROKENDI XR 200 MG CP24     . valACYclovir (VALTREX) 1000 MG tablet Take 1,000 mg as needed by mouth.     . Vitamin D, Ergocalciferol, (DRISDOL) 50000 units CAPS capsule     . zolpidem (AMBIEN) 10 MG tablet Take 10 mg by mouth at bedtime.     No current facility-administered medications for this visit.    Patient Active Problem List   Diagnosis Date Noted  . Primary osteoarthritis of both hands 11/17/2016  . Chronic pain of right knee 10/03/2016  . History of hypertension 10/03/2016  . History of migraine 10/03/2016  . Gastroesophageal reflux  disease without esophagitis 10/03/2016  . History of herpes simplex infection 10/03/2016  . History of total knee replacement, right 06/18/2014  . Other chronic postoperative pain 01/25/2014  . Osteoarthritis of left knee 01/25/2014  . Postoperative anemia due to acute blood loss 02/24/2012  . Obesity (BMI 30-39.9) 02/24/2012   Past Medical History:  Diagnosis Date  . Arthritis   . Constipation   . Family history of anesthesia complication    ?  Aunt did not wake up.  Ms Roblyer not sure where she had surgery,l  . GERD (gastroesophageal reflux disease)    decreased since she is not taking advail  . Herpes simplex   . Hypertension    started on BP medication last Wednesday per PCP  . Migraine     2 a week  . Pneumonia         Family History  Problem Relation Age of Onset  . Hypertension Mother   . Cancer Mother   . Hypertension Father   . Asthma Sister   . Migraines Sister   . Colon cancer Neg Hx     Past Surgical History:  Procedure Laterality Date  . Fatty tumor    . KNEE ARTHROSCOPY Right   . KNEE SURGERY  02/22/2012  . KNEE SURGERY Right 06/18/2014  . TOTAL KNEE ARTHROPLASTY  02/22/2012   Procedure: TOTAL KNEE ARTHROPLASTY;  Surgeon: Garald Balding, MD;  Location: [facility];  Service: Orthopedics;  Laterality: Right;  . TOTAL KNEE REVISION Right 06/18/2014   Procedure: RIGHT TOTAL KNEE REVISION;  Surgeon: Garald Balding, MD;  Location: [facility];  Service: Orthopedics;  Laterality: Right;  . TUBAL LIGATION     Social History   Occupational History  . Not on file  Tobacco Use  . Smoking status: Never Smoker  . Smokeless tobacco: Never Used  Substance and Sexual Activity  . Alcohol use: Yes    Alcohol/week: 0.0 standard drinks    Comment: occasional  . Drug use: No  . Sexual activity: Yes    Birth control/protection: Surgical

## 2020-10-08 ENCOUNTER — Ambulatory Visit: Payer: Federal, State, Local not specified - PPO | Admitting: Orthopaedic Surgery

## 2020-10-08 ENCOUNTER — Ambulatory Visit: Payer: Self-pay

## 2020-10-08 ENCOUNTER — Encounter: Payer: Self-pay | Admitting: Orthopaedic Surgery

## 2020-10-08 ENCOUNTER — Other Ambulatory Visit: Payer: Self-pay

## 2020-10-08 VITALS — Ht 65.0 in | Wt 178.0 lb

## 2020-10-08 DIAGNOSIS — M1712 Unilateral primary osteoarthritis, left knee: Secondary | ICD-10-CM | POA: Diagnosis not present

## 2020-10-08 MED ORDER — BUPIVACAINE HCL 0.25 % IJ SOLN
2.0000 mL | INTRAMUSCULAR | Status: AC | PRN
  Administered 2020-10-08: 2 mL via INTRA_ARTICULAR
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
## 2020-10-22 ENCOUNTER — Encounter: Payer: Self-pay | Admitting: Gastroenterology

## 2020-11-05 ENCOUNTER — Encounter: Payer: Self-pay | Admitting: Orthopaedic Surgery

## 2020-11-05 ENCOUNTER — Other Ambulatory Visit: Payer: Self-pay

## 2020-11-05 ENCOUNTER — Ambulatory Visit: Payer: Federal, State, Local not specified - PPO | Admitting: Orthopaedic Surgery

## 2020-11-05 VITALS — Ht 65.0 in | Wt 178.0 lb

## 2020-11-05 DIAGNOSIS — M1712 Unilateral primary osteoarthritis, left knee: Secondary | ICD-10-CM

## 2020-11-05 NOTE — Progress Notes (Signed)
Office Visit Note   Patient: Cheyenne Graham           Date of Birth: Nov 16, 1965           MRN: [id number] Visit Date: 11/05/2020              Requested by: Sandi Mariscal, [place],  [place] [address] PCP: Sandi Mariscal, MD   Assessment & Plan: Visit Diagnoses:  1. Primary osteoarthritis of left knee     Plan: Jolette has end-stage osteoarthritis of her left knee.  She has had a very difficult course over several years with compromise of her activities.  She has had cortisone injection with minimal relief.  Films within the last several weeks demonstrate significant narrowing of the medial and lateral joint spaces with peripheral osteophytes.  She has just about full knee extension and flexes only to about 95 degrees.  She is reached a point where she is really miserable in her vocational and avocational activities.  She does realize that it is time to consider knee replacement.  She is not sure if she wants to do right away or wait several months but I think it is worth having Dr. Ninfa Linden evaluate her.  Discussed all the above with her.  She has had a full course of conservative treatment over several years  Follow-Up Instructions: Return Refer to Dr. Ninfa Linden for knee replacement.   Orders:  Orders Placed This Encounter  Procedures  . Ambulatory referral to Orthopedic Surgery   No orders of the defined types were placed in this encounter.     Procedures: No procedures performed   Clinical Data: No additional findings.   Subjective: Chief Complaint  Patient presents with  . Left Knee - Follow-up, Pain  Patient presents today for a one month follow up on her left knee. She received a left knee cortisone injection at her last office visit. She states that the injection did help her, but she is still having a lot of pain.   HPI  Review of Systems   Objective: Vital Signs: Ht 5\' 5"  (1.651 m)   Wt 178 lb (80.7 kg)   LMP 09/14/2015 (Approximate)   BMI 29.62  kg/m   Physical Exam Constitutional:      Appearance: She is well-developed and well-nourished.  HENT:     Mouth/Throat:     Mouth: Oropharynx is clear and moist.  Eyes:     Extraocular Movements: EOM normal.     Pupils: Pupils are equal, round, and reactive to light.  Pulmonary:     Effort: Pulmonary effort is normal.  Skin:    General: Skin is warm and dry.  Neurological:     Mental Status: She is alert and oriented to person, place, and time.  Psychiatric:        Mood and Affect: Mood and affect normal.        Behavior: Behavior normal.     Ortho Exam awake alert and oriented x3.  Comfortable sitting.  Left knee with full extension only about 95 degrees of flexion.  Having both medial lateral joint pain.  The knee was not hot warm or red.  Might have some hypertrophic changes but not sure she has a true effusion.  No popliteal pain or mass.  No calf pain.  Straight leg raise negative.  Painless range of motion both hips  Specialty Comments:  No specialty comments available.  Imaging: No results found.   PMFS History: Patient Active  Problem List   Diagnosis Date Noted  . Primary osteoarthritis of both hands 11/17/2016  . Chronic pain of right knee 10/03/2016  . History of hypertension 10/03/2016  . History of migraine 10/03/2016  . Gastroesophageal reflux disease without esophagitis 10/03/2016  . History of herpes simplex infection 10/03/2016  . History of total knee replacement, right 06/18/2014  . Other chronic postoperative pain 01/25/2014  . Osteoarthritis of left knee 01/25/2014  . Postoperative anemia due to acute blood loss 02/24/2012  . Obesity (BMI 30-39.9) 02/24/2012   Past Medical History:  Diagnosis Date  . Arthritis   . Constipation   . Family history of anesthesia complication    ?  Aunt did not wake up.  Ms Terhaar not sure where she had surgery,l  . GERD (gastroesophageal reflux disease)    decreased since she is not taking advail  . Herpes  simplex   . Hypertension    started on BP medication last Wednesday per PCP  . Migraine     2 a week  . Pneumonia         Family History  Problem Relation Age of Onset  . Hypertension Mother   . Cancer Mother   . Hypertension Father   . Asthma Sister   . Migraines Sister   . Colon cancer Neg Hx     Past Surgical History:  Procedure Laterality Date  . Fatty tumor    . KNEE ARTHROSCOPY Right   . KNEE SURGERY  02/22/2012  . KNEE SURGERY Right 06/18/2014  . TOTAL KNEE ARTHROPLASTY  02/22/2012   Procedure: TOTAL KNEE ARTHROPLASTY;  Surgeon: Garald Balding, MD;  Location: [facility];  Service: Orthopedics;  Laterality: Right;  . TOTAL KNEE REVISION Right 06/18/2014   Procedure: RIGHT TOTAL KNEE REVISION;  Surgeon: Garald Balding, MD;  Location: [facility];  Service: Orthopedics;  Laterality: Right;  . TUBAL LIGATION     Social History   Occupational History  . Not on file  Tobacco Use  . Smoking status: Never Smoker  . Smokeless tobacco: Never Used  Vaping Use  . Vaping Use: Never used  Substance and Sexual Activity  . Alcohol use: Yes    Alcohol/week: 0.0 standard drinks    Comment: occasional  . Drug use: No  . Sexual activity: Yes    Birth control/protection: Surgical

## 2020-11-27 ENCOUNTER — Encounter: Payer: Self-pay | Admitting: Orthopaedic Surgery

## 2020-11-27 ENCOUNTER — Ambulatory Visit: Payer: Federal, State, Local not specified - PPO | Admitting: Orthopaedic Surgery

## 2020-11-27 VITALS — Ht 65.0 in | Wt 178.0 lb

## 2020-11-27 DIAGNOSIS — G8929 Other chronic pain: Secondary | ICD-10-CM

## 2020-11-27 DIAGNOSIS — M1712 Unilateral primary osteoarthritis, left knee: Secondary | ICD-10-CM | POA: Diagnosis not present

## 2020-11-27 DIAGNOSIS — M25562 Pain in left knee: Secondary | ICD-10-CM | POA: Diagnosis not present

## 2020-11-27 MED ORDER — METHYLPREDNISOLONE ACETATE 40 MG/ML IJ SUSP
40.0000 mg | INTRAMUSCULAR | Status: AC | PRN
  Administered 2020-11-27: 40 mg via INTRA_ARTICULAR

## 2020-11-27 MED ORDER — LIDOCAINE HCL 1 % IJ SOLN
3.0000 mL | INTRAMUSCULAR | Status: AC | PRN
  Administered 2020-11-27: 3 mL

## 2020-11-27 MED ORDER — ACETAMINOPHEN-CODEINE #3 300-30 MG PO TABS: 1.0000 | ORAL_TABLET | Freq: Three times a day (TID) | ORAL | 0 refills | Status: DC | PRN

## 2020-11-27 NOTE — Progress Notes (Signed)
Office Visit Note   Patient: Cheyenne Graham           Date of Birth: [birth year]           MRN: [id number] Visit Date: 11/27/2020              Requested by: Garald Balding, MD [address] [place],  [place] [address] PCP: Sandi Mariscal, MD   Assessment & Plan: Visit Diagnoses:  1. Chronic pain of left knee   2. Unilateral primary osteoarthritis, left knee     Plan: She is having such severe pain in her left knee today we are recommending knee replacement surgery and she agrees with this as well.  I showed her knee replacement model and talked in detail about the surgery.  Having had it before as well as revision of her right knee she is fully aware what the surgery involves including the interoperative and postoperative course.  We discussed the risk and benefits of surgery in detail.  I do feel it is worth trying a steroid injection in her left knee today just to help her try to return to work in the short-term while awaiting to get her knee replacement scheduled.  I will also put her on some Tylenol 3 to see if this will help augment her pain.  All questions and concerns were answered and addressed.  We will be in touch about scheduling a knee replacement for her.  Follow-Up Instructions: Return for 2 weeks post-op.   Orders:  Orders Placed This Encounter  Procedures  . Large Joint Inj   Meds ordered this encounter  Medications  . acetaminophen-codeine (TYLENOL #3) 300-30 MG tablet    Sig: Take 1-2 tablets by mouth every 8 (eight) hours as needed.    Dispense:  30 tablet    Refill:  0      Procedures: Large Joint Inj: L knee on 11/27/2020 4:22 PM Indications: diagnostic evaluation and pain Details: 22 G 1.5 in needle, superolateral approach  Arthrogram: No  Medications: 3 mL lidocaine 1 %; 40 mg methylPREDNISolone acetate 40 MG/ML Outcome: tolerated well, no immediate complications Procedure, treatment alternatives, risks and benefits explained, specific risks discussed.  Consent was given by the patient. Immediately prior to procedure a time out was called to verify the correct patient, procedure, equipment, support staff and site/side marked as required. Patient was prepped and draped in the usual sterile fashion.       Clinical Data: No additional findings.   Subjective: Chief Complaint  Patient presents with  . Left Knee - Follow-up  The patient is a very pleasant 55 year old who unfortunately has severe end-stage arthritis of her left knee.  She actually has bad arthritic joints in her fingers and has a history of a right total knee arthroplasty and then a revision of that.  She tries to stay as active as she can.  She is worked on activity modification and weight loss.  She ambulates with a cane.  She has been on oral anti-inflammatories and worked on Forensic scientist exercises.  Her arthritis in her left knee is well-documented and I have x-rays today as well which I have reviewed already.  Those x-rays show severe end-stage arthritis throughout the left knee.  She has been dealing with this for well over 12 months and is gotten significantly worse where it is definitely affecting her mobility, her quality of life and activities day living.  She has had to miss work due to the severity  of her left knee pain.  She is not diabetic.  She does not have any significant active medical issues.  HPI  Review of Systems She currently denies any headache, chest pain, shortness of breath, fever, chills, nausea, vomiting  Objective: Vital Signs: Ht 5\' 5"  (1.651 m)   Wt 178 lb (80.7 kg)   LMP 09/14/2015 (Approximate)   BMI 29.62 kg/m   Physical Exam She is alert and orient x3 and in no acute distress Ortho Exam Examination of her left knee shows a mild to moderate effusion.  There is global tenderness along the medial lateral joint lines and significant patellofemoral crepitation.  She has limitations in range of motion secondary to pain as well. Specialty  Comments:  No specialty comments available.  Imaging: No results found. X-rays of the left knee reviewed independently show severe end-stage arthritis.  There is slight varus malalignment.  There is complete loss of the medial joint space.  There is loss of the patellofemoral joint as well.  There are large para-articular osteophytes in all 3 compartments.  This is end-stage arthritis at its worst.  PMFS History: Patient Active Problem List   Diagnosis Date Noted  . Unilateral primary osteoarthritis, left knee 11/27/2020  . Primary osteoarthritis of both hands 11/17/2016  . Chronic pain of right knee 10/03/2016  . History of hypertension 10/03/2016  . History of migraine 10/03/2016  . Gastroesophageal reflux disease without esophagitis 10/03/2016  . History of herpes simplex infection 10/03/2016  . History of total knee replacement, right 06/18/2014  . Other chronic postoperative pain 01/25/2014  . Osteoarthritis of left knee 01/25/2014  . Postoperative anemia due to acute blood loss 02/24/2012  . Obesity (BMI 30-39.9) 02/24/2012   Past Medical History:  Diagnosis Date  . Arthritis   . Constipation   . Family history of anesthesia complication    ?  Aunt did not wake up.  Ms Hanna not sure where she had surgery,l  . GERD (gastroesophageal reflux disease)    decreased since she is not taking advail  . Herpes simplex   . Hypertension    started on BP medication last Wednesday per PCP  . Migraine     2 a week  . Pneumonia         Family History  Problem Relation Age of Onset  . Hypertension Mother   . Cancer Mother   . Hypertension Father   . Asthma Sister   . Migraines Sister   . Colon cancer Neg Hx     Past Surgical History:  Procedure Laterality Date  . Fatty tumor    . KNEE ARTHROSCOPY Right   . KNEE SURGERY  02/22/2012  . KNEE SURGERY Right 06/18/2014  . TOTAL KNEE ARTHROPLASTY  02/22/2012   Procedure: TOTAL KNEE ARTHROPLASTY;  Surgeon: Garald Balding, MD;   Location: [facility];  Service: Orthopedics;  Laterality: Right;  . TOTAL KNEE REVISION Right 06/18/2014   Procedure: RIGHT TOTAL KNEE REVISION;  Surgeon: Garald Balding, MD;  Location: [facility];  Service: Orthopedics;  Laterality: Right;  . TUBAL LIGATION     Social History   Occupational History  . Not on file  Tobacco Use  . Smoking status: Never Smoker  . Smokeless tobacco: Never Used  Vaping Use  . Vaping Use: Never used  Substance and Sexual Activity  . Alcohol use: Yes    Alcohol/week: 0.0 standard drinks    Comment: occasional  . Drug use: No  . Sexual activity: Yes  Birth control/protection: Surgical

## 2020-12-15 ENCOUNTER — Telehealth: Payer: Self-pay | Admitting: Orthopaedic Surgery

## 2020-12-15 ENCOUNTER — Other Ambulatory Visit: Payer: Self-pay | Admitting: Orthopaedic Surgery

## 2020-12-15 MED ORDER — ACETAMINOPHEN-CODEINE #3 300-30 MG PO TABS: ORAL_TABLET | ORAL | 0 refills | Status: DC

## 2020-12-15 NOTE — Telephone Encounter (Signed)
Pt was called and informed and stated understanding  

## 2020-12-15 NOTE — Telephone Encounter (Signed)
Please advise 

## 2020-12-15 NOTE — Telephone Encounter (Signed)
I did send some in.

## 2020-12-15 NOTE — Telephone Encounter (Signed)
Pt called asking for a refill of her tylenol 3 and she would like to be updated once this has been sent in.   [phone]

## 2021-01-01 ENCOUNTER — Telehealth: Payer: Self-pay | Admitting: Orthopaedic Surgery

## 2021-01-01 NOTE — Telephone Encounter (Signed)
Do we know when surgery is yet, or no?

## 2021-01-01 NOTE — Telephone Encounter (Signed)
Pt would like a note to go back to work until her surgery. She would like it emailed to [email or web address]

## 2021-01-02 NOTE — Telephone Encounter (Signed)
Emailed

## 2021-01-05 ENCOUNTER — Other Ambulatory Visit: Payer: Self-pay

## 2021-01-20 ENCOUNTER — Other Ambulatory Visit: Payer: Self-pay | Admitting: Physician Assistant

## 2021-01-20 ENCOUNTER — Telehealth: Payer: Self-pay | Admitting: Orthopaedic Surgery

## 2021-01-20 NOTE — Telephone Encounter (Signed)
Please advise 

## 2021-01-20 NOTE — Telephone Encounter (Signed)
Patient called asked if she can get a note taking her out of work as of today. Patient said she is so much pain. Patient said her job requires her to stand all day and lift up to 25 to 40 lbs. Patient said she can not do her job because of the pain she is in. Patient said the pain medicine is not working either. The number to contact patient is [phone]

## 2021-01-20 NOTE — Telephone Encounter (Signed)
I am fine with her getting a work note today keeping her out of work due to the pain she is having.  I have no recommendations in terms of medications other than over-the-counter Tylenol and/or Aleve.

## 2021-01-20 NOTE — Telephone Encounter (Signed)
I talked to pt. She wants to be out of work until surgery date. I completed this note. Pt was informed of completion

## 2021-01-20 NOTE — Telephone Encounter (Signed)
I am fine for however long she needs to be off.

## 2021-01-20 NOTE — Telephone Encounter (Signed)
Just for today? The note makes it seem like she wants to be out of work for a longer period of time.

## 2021-01-22 NOTE — Progress Notes (Signed)
Surgical Instructions    Your procedure is scheduled on 01/27/21.  Report to [facility] Main Entrance "A" at 10:00 A.M., then check in with the Admitting office.  Call this number if you have problems the morning of surgery:  [phone]   If you have any questions prior to your surgery date call [phone]: Open Monday-Friday 8am-4pm    Remember:  Do not eat after midnight the night before your surgery  You may drink clear liquids until 09:00 AM the morning of your surgery.   Clear liquids allowed are: Water, Non-Citrus Juices (without pulp), Carbonated Beverages, Clear Tea, Black Coffee Only, and Gatorade  Patient Instructions  . The night before surgery:  o No food after midnight. ONLY clear liquids after midnight  . The day of surgery (if you do NOT have diabetes):  o Drink ONE (1) Pre-Surgery Clear Ensure by 09:00 AM the morning of surgery. Drink in one sitting. Do not sip.  o This drink was given to you during your hospital  pre-op appointment visit. o Nothing else to drink after completing the  Pre-Surgery Clear Ensure.         If you have questions, please contact your surgeon's office.     Take these medicines the morning of surgery with A SIP OF WATER  acetaminophen-codeine (TYLENOL #3)  If needed valACYclovir (VALTREX) if needed Your new blood pressure medication?  As of today, STOP taking any Aspirin (unless otherwise instructed by your surgeon) Aleve, Naproxen, Ibuprofen, Motrin, Advil, Goody's, BC's, all herbal medications, fish oil, and all vitamins.                     Do not wear jewelry, make up, or nail polish            Do not wear lotions, powders, perfumes, or deodorant.            Do not shave 48 hours prior to surgery.              Do not bring valuables to the hospital.            [facility] is not responsible for any belongings or valuables.  Do NOT Smoke (Tobacco/Vaping) or drink Alcohol 24 hours prior to your procedure If you use a CPAP at  night, you may bring all equipment for your overnight stay.   Contacts, glasses, dentures or bridgework may not be worn into surgery, please bring cases for these belongings   For patients admitted to the hospital, discharge time will be determined by your treatment team.   Patients discharged the day of surgery will not be allowed to drive home, and someone needs to stay with them for 24 hours.    Special instructions:    Oral Hygiene is also important to reduce your risk of infection.  Remember - BRUSH YOUR TEETH THE MORNING OF SURGERY WITH YOUR REGULAR TOOTHPASTE   [place]- Preparing For Surgery  Before surgery, you can play an important role. Because skin is not sterile, your skin needs to be as free of germs as possible. You can reduce the number of germs on your skin by washing with CHG (chlorahexidine gluconate) Soap before surgery.  CHG is an antiseptic cleaner which kills germs and bonds with the skin to continue killing germs even after washing.     Please do not use if you have an allergy to CHG or antibacterial soaps. If your skin becomes reddened/irritated stop using the CHG.  Do not shave (including legs and underarms) for at least 48 hours prior to first CHG shower. It is OK to shave your face.  Please follow these instructions carefully.    1.  Shower the NIGHT BEFORE SURGERY and the MORNING OF SURGERY with CHG Soap.   If you chose to wash your hair, wash your hair first as usual with your normal shampoo. After you shampoo, rinse your hair and body thoroughly to remove the shampoo.  Then [employer] and genitals (private parts) with your normal soap and rinse thoroughly to remove soap.  2. After that Use CHG Soap as you would any other liquid soap. You can apply CHG directly to the skin and wash gently with a scrungie or a clean washcloth.   3. Apply the CHG Soap to your body ONLY FROM THE NECK DOWN.  Do not use on open wounds or open sores. Avoid contact with your  eyes, ears, mouth and genitals (private parts). Wash Face and genitals (private parts)  with your normal soap.   4. Wash thoroughly, paying special attention to the area where your surgery will be performed.  5. Thoroughly rinse your body with warm water from the neck down.  6. DO NOT shower/wash with your normal soap after using and rinsing off the CHG Soap.  7. Pat yourself dry with a CLEAN TOWEL.  8. Wear CLEAN PAJAMAS to bed the night before surgery  9. Place CLEAN SHEETS on your bed the night before your surgery  10. DO NOT SLEEP WITH PETS.   Day of Surgery: Take a shower with CHG soap. Wear Clean/Comfortable clothing the morning of surgery Do not apply any deodorants/lotions.   Remember to brush your teeth WITH YOUR REGULAR TOOTHPASTE.   Please read over the following fact sheets that you were given.

## 2021-01-23 ENCOUNTER — Encounter (HOSPITAL_COMMUNITY)
Admission: RE | Admit: 2021-01-23 | Discharge: 2021-01-23 | Disposition: A | Discharge status: Home / Self Care | Payer: Federal, State, Local not specified - PPO | Source: Ambulatory Visit | Attending: Orthopaedic Surgery | Admitting: Orthopaedic Surgery

## 2021-01-23 ENCOUNTER — Encounter (HOSPITAL_COMMUNITY): Payer: Self-pay

## 2021-01-23 ENCOUNTER — Other Ambulatory Visit: Payer: Self-pay

## 2021-01-23 DIAGNOSIS — Z01818 Encounter for other preprocedural examination: Secondary | ICD-10-CM | POA: Diagnosis present

## 2021-01-23 DIAGNOSIS — Z20822 Contact with and (suspected) exposure to covid-19: Secondary | ICD-10-CM | POA: Diagnosis not present

## 2021-01-23 HISTORY — DX: Other specified postprocedural states: Z98.890

## 2021-01-23 HISTORY — DX: Other specified postprocedural states: R11.2

## 2021-01-23 LAB — CBC
HCT: 43.6 % (ref 36.0–46.0)
Hemoglobin: 14.4 g/dL (ref 12.0–15.0)
MCH: 28.3 pg (ref 26.0–34.0)
MCHC: 33 g/dL (ref 30.0–36.0)
MCV: 85.8 fL (ref 80.0–100.0)
Platelets: 354 10*3/uL (ref 150–400)
RBC: 5.08 MIL/uL (ref 3.87–5.11)
RDW: 12.4 % (ref 11.5–15.5)
WBC: 3.6 10*3/uL — ABNORMAL LOW (ref 4.0–10.5)
nRBC: 0 % (ref 0.0–0.2)

## 2021-01-23 LAB — SARS CORONAVIRUS 2 (TAT 6-24 HRS): SARS Coronavirus 2: NEGATIVE

## 2021-01-23 LAB — BASIC METABOLIC PANEL
Anion gap: 10 (ref 5–15)
BUN: 12 mg/dL (ref 6–20)
CO2: 24 mmol/L (ref 22–32)
Calcium: 9.1 mg/dL (ref 8.9–10.3)
Chloride: 102 mmol/L (ref 98–111)
Creatinine, Ser: 0.68 mg/dL (ref 0.44–1.00)
GFR, Estimated: >60 mL/min (ref >60)
Glucose, Bld: 94 mg/dL (ref 70–99)
Potassium: 3.7 mmol/L (ref 3.5–5.1)
Sodium: 136 mmol/L (ref 135–145)

## 2021-01-23 LAB — TYPE AND SCREEN
ABO/RH(D): B POS
Antibody Screen: NEGATIVE

## 2021-01-23 LAB — SURGICAL PCR SCREEN
MRSA, PCR: NEGATIVE
Staphylococcus aureus: NEGATIVE

## 2021-01-23 NOTE — Progress Notes (Signed)
PCP - Sandi Mariscal Cardiologist - denies  PPM/ICD - denies   Chest x-ray - n/a EKG - 01/23/21 Stress Test - over 10 years ago, normal per pt ECHO - denies Cardiac Cath - denies  Sleep Study - denies   No diabetes  As of today, STOP taking any Aspirin (unless otherwise instructed by your surgeon) Aleve, Naproxen, Ibuprofen, Motrin, Advil, Goody's, BC's, all herbal medications, fish oil, and all vitamins.    ERAS Protcol -no   COVID TEST- 01/23/21   Anesthesia review: no  Patient denies shortness of breath, fever, cough and chest pain at PAT appointment   All instructions explained to the patient, with a verbal understanding of the material. Patient agrees to go over the instructions while at home for a better understanding. Patient also instructed to self quarantine after being tested for COVID-19. The opportunity to ask questions was provided.

## 2021-01-27 ENCOUNTER — Encounter (HOSPITAL_COMMUNITY)
Admission: RE | Disposition: A | Discharge status: Home / Self Care | Payer: Self-pay | Source: Home / Self Care | Attending: Orthopaedic Surgery

## 2021-01-27 ENCOUNTER — Ambulatory Visit (HOSPITAL_COMMUNITY): Payer: Federal, State, Local not specified - PPO | Admitting: Vascular Surgery

## 2021-01-27 ENCOUNTER — Observation Stay (HOSPITAL_COMMUNITY): Payer: Federal, State, Local not specified - PPO

## 2021-01-27 ENCOUNTER — Encounter (HOSPITAL_COMMUNITY): Payer: Self-pay | Admitting: Orthopaedic Surgery

## 2021-01-27 ENCOUNTER — Observation Stay (HOSPITAL_COMMUNITY)
Admission: RE | Admit: 2021-01-27 | Discharge: 2021-01-28 | Disposition: A | Discharge status: Home / Self Care | Payer: Federal, State, Local not specified - PPO | Attending: Orthopaedic Surgery | Admitting: Orthopaedic Surgery

## 2021-01-27 ENCOUNTER — Ambulatory Visit (HOSPITAL_COMMUNITY): Payer: Federal, State, Local not specified - PPO | Admitting: Anesthesiology

## 2021-01-27 DIAGNOSIS — M1712 Unilateral primary osteoarthritis, left knee: Secondary | ICD-10-CM

## 2021-01-27 DIAGNOSIS — Z96651 Presence of right artificial knee joint: Secondary | ICD-10-CM | POA: Insufficient documentation

## 2021-01-27 DIAGNOSIS — Z96652 Presence of left artificial knee joint: Secondary | ICD-10-CM

## 2021-01-27 HISTORY — PX: TOTAL KNEE ARTHROPLASTY: SHX125

## 2021-01-27 SURGERY — ARTHROPLASTY, KNEE, TOTAL
Anesthesia: Regional | Site: Knee | Laterality: Left

## 2021-01-27 MED ORDER — MIDAZOLAM HCL 2 MG/2ML IJ SOLN: 2.0000 mg | Freq: Once | INTRAMUSCULAR | Status: AC

## 2021-01-27 MED ORDER — PROPOFOL 10 MG/ML IV BOLUS
INTRAVENOUS | Status: DC | PRN
  Administered 2021-01-27: 50 ug/kg/min via INTRAVENOUS

## 2021-01-27 MED ORDER — FENTANYL CITRATE (PF) 250 MCG/5ML IJ SOLN
INTRAMUSCULAR | Status: AC
  Filled 2021-01-27: qty 5

## 2021-01-27 MED ORDER — DIPHENHYDRAMINE HCL 12.5 MG/5ML PO ELIX
12.5000 mg | ORAL_SOLUTION | ORAL | Status: DC | PRN
  Filled 2021-01-27: qty 10

## 2021-01-27 MED ORDER — FENTANYL CITRATE (PF) 100 MCG/2ML IJ SOLN
INTRAMUSCULAR | Status: AC
  Administered 2021-01-27: 100 ug via INTRAVENOUS
  Filled 2021-01-27: qty 2

## 2021-01-27 MED ORDER — FENTANYL CITRATE (PF) 100 MCG/2ML IJ SOLN
INTRAMUSCULAR | Status: AC
  Filled 2021-01-27: qty 2

## 2021-01-27 MED ORDER — CEFAZOLIN SODIUM-DEXTROSE 2-4 GM/100ML-% IV SOLN
2.0000 g | INTRAVENOUS | Status: AC
  Administered 2021-01-27: 2 g via INTRAVENOUS
  Filled 2021-01-27: qty 100

## 2021-01-27 MED ORDER — FENTANYL CITRATE (PF) 100 MCG/2ML IJ SOLN: 100.0000 ug | Freq: Once | INTRAMUSCULAR | Status: AC

## 2021-01-27 MED ORDER — METHOCARBAMOL 500 MG PO TABS
500.0000 mg | ORAL_TABLET | Freq: Four times a day (QID) | ORAL | Status: DC | PRN
  Administered 2021-01-27 – 2021-01-28 (×4): 500 mg via ORAL
  Filled 2021-01-27 (×3): qty 1

## 2021-01-27 MED ORDER — ONDANSETRON HCL 4 MG/2ML IJ SOLN: 4.0000 mg | Freq: Four times a day (QID) | INTRAMUSCULAR | Status: DC | PRN

## 2021-01-27 MED ORDER — OXYCODONE HCL 5 MG PO TABS
ORAL_TABLET | ORAL | Status: AC
  Filled 2021-01-27: qty 2

## 2021-01-27 MED ORDER — ONDANSETRON HCL 4 MG/2ML IJ SOLN
INTRAMUSCULAR | Status: DC | PRN
  Administered 2021-01-27: 4 mg via INTRAVENOUS

## 2021-01-27 MED ORDER — CEFAZOLIN SODIUM-DEXTROSE 1-4 GM/50ML-% IV SOLN
1.0000 g | Freq: Four times a day (QID) | INTRAVENOUS | Status: AC
Start: 2021-01-27 — End: 2021-01-28
  Administered 2021-01-27 – 2021-01-28 (×2): 1 g via INTRAVENOUS
  Filled 2021-01-27 (×2): qty 50

## 2021-01-27 MED ORDER — ASPIRIN 81 MG PO CHEW
81.0000 mg | CHEWABLE_TABLET | Freq: Two times a day (BID) | ORAL | Status: DC
  Administered 2021-01-27 – 2021-01-28 (×2): 81 mg via ORAL
  Filled 2021-01-27 (×2): qty 1

## 2021-01-27 MED ORDER — OXYCODONE HCL 5 MG PO TABS: 5.0000 mg | ORAL_TABLET | Freq: Once | ORAL | Status: DC | PRN

## 2021-01-27 MED ORDER — TRANEXAMIC ACID-NACL 1000-0.7 MG/100ML-% IV SOLN
1000.0000 mg | INTRAVENOUS | Status: AC
  Administered 2021-01-27: 1000 mg via INTRAVENOUS
  Filled 2021-01-27: qty 100

## 2021-01-27 MED ORDER — BUPIVACAINE IN DEXTROSE 0.75-8.25 % IT SOLN
INTRATHECAL | Status: DC | PRN
  Administered 2021-01-27: 1.8 mL via INTRATHECAL

## 2021-01-27 MED ORDER — ALUM & MAG HYDROXIDE-SIMETH 200-200-20 MG/5ML PO SUSP: 30.0000 mL | ORAL | Status: DC | PRN

## 2021-01-27 MED ORDER — MENTHOL 3 MG MT LOZG: 1.0000 | LOZENGE | OROMUCOSAL | Status: DC | PRN

## 2021-01-27 MED ORDER — OXYCODONE HCL 5 MG/5ML PO SOLN: 5.0000 mg | Freq: Once | ORAL | Status: DC | PRN

## 2021-01-27 MED ORDER — PROMETHAZINE HCL 25 MG/ML IJ SOLN
6.2500 mg | INTRAMUSCULAR | Status: DC | PRN
  Administered 2021-01-27: 6.25 mg via INTRAVENOUS

## 2021-01-27 MED ORDER — OXYCODONE HCL 5 MG PO TABS
5.0000 mg | ORAL_TABLET | ORAL | Status: DC | PRN
  Administered 2021-01-27 – 2021-01-28 (×5): 10 mg via ORAL
  Filled 2021-01-27 (×3): qty 2

## 2021-01-27 MED ORDER — CHLORHEXIDINE GLUCONATE 0.12 % MT SOLN
15.0000 mL | Freq: Once | OROMUCOSAL | Status: AC
  Administered 2021-01-27: 15 mL via OROMUCOSAL
  Filled 2021-01-27: qty 15

## 2021-01-27 MED ORDER — METOCLOPRAMIDE HCL 5 MG/ML IJ SOLN: 5.0000 mg | Freq: Three times a day (TID) | INTRAMUSCULAR | Status: DC | PRN

## 2021-01-27 MED ORDER — PHENOL 1.4 % MT LIQD: 1.0000 | OROMUCOSAL | Status: DC | PRN

## 2021-01-27 MED ORDER — PROPOFOL 10 MG/ML IV BOLUS
INTRAVENOUS | Status: AC
  Filled 2021-01-27: qty 20

## 2021-01-27 MED ORDER — FENTANYL CITRATE (PF) 100 MCG/2ML IJ SOLN
25.0000 ug | INTRAMUSCULAR | Status: DC | PRN
  Administered 2021-01-27 (×2): 50 ug via INTRAVENOUS

## 2021-01-27 MED ORDER — SODIUM CHLORIDE 0.9 % IR SOLN
Status: DC | PRN
  Administered 2021-01-27: 3000 mL

## 2021-01-27 MED ORDER — 0.9 % SODIUM CHLORIDE (POUR BTL) OPTIME
TOPICAL | Status: DC | PRN
  Administered 2021-01-27: 1000 mL

## 2021-01-27 MED ORDER — ADULT MULTIVITAMIN W/MINERALS CH
1.0000 | ORAL_TABLET | Freq: Every morning | ORAL | Status: DC
  Administered 2021-01-28: 1 via ORAL
  Filled 2021-01-27: qty 1

## 2021-01-27 MED ORDER — ORAL CARE MOUTH RINSE: 15.0000 mL | Freq: Once | OROMUCOSAL | Status: AC

## 2021-01-27 MED ORDER — METHOCARBAMOL 500 MG PO TABS
ORAL_TABLET | ORAL | Status: AC
  Filled 2021-01-27: qty 1

## 2021-01-27 MED ORDER — DOCUSATE SODIUM 100 MG PO CAPS
100.0000 mg | ORAL_CAPSULE | Freq: Two times a day (BID) | ORAL | Status: DC
  Administered 2021-01-27 – 2021-01-28 (×2): 100 mg via ORAL
  Filled 2021-01-27 (×2): qty 1

## 2021-01-27 MED ORDER — HYDROMORPHONE HCL 1 MG/ML IJ SOLN
0.5000 mg | INTRAMUSCULAR | Status: DC | PRN
  Administered 2021-01-27 (×2): 1 mg via INTRAVENOUS
  Filled 2021-01-27 (×2): qty 1

## 2021-01-27 MED ORDER — ONDANSETRON HCL 4 MG PO TABS: 4.0000 mg | ORAL_TABLET | Freq: Four times a day (QID) | ORAL | Status: DC | PRN

## 2021-01-27 MED ORDER — LACTATED RINGERS IV SOLN: INTRAVENOUS | Status: DC

## 2021-01-27 MED ORDER — PROMETHAZINE HCL 25 MG/ML IJ SOLN
INTRAMUSCULAR | Status: AC
  Filled 2021-01-27: qty 1

## 2021-01-27 MED ORDER — PHENYLEPHRINE HCL-NACL 10-0.9 MG/250ML-% IV SOLN
INTRAVENOUS | Status: DC | PRN
  Administered 2021-01-27: 20 ug/min via INTRAVENOUS

## 2021-01-27 MED ORDER — ONDANSETRON HCL 4 MG/2ML IJ SOLN
INTRAMUSCULAR | Status: AC
  Filled 2021-01-27: qty 2

## 2021-01-27 MED ORDER — POLYETHYLENE GLYCOL 3350 17 G PO PACK: 17.0000 g | PACK | Freq: Every day | ORAL | Status: DC | PRN

## 2021-01-27 MED ORDER — KETOROLAC TROMETHAMINE 15 MG/ML IJ SOLN
15.0000 mg | Freq: Four times a day (QID) | INTRAMUSCULAR | Status: DC
  Administered 2021-01-27 – 2021-01-28 (×4): 15 mg via INTRAVENOUS
  Filled 2021-01-27 (×4): qty 1

## 2021-01-27 MED ORDER — MIDAZOLAM HCL 2 MG/2ML IJ SOLN
INTRAMUSCULAR | Status: AC
  Administered 2021-01-27: 2 mg via INTRAVENOUS
  Filled 2021-01-27: qty 2

## 2021-01-27 MED ORDER — PANTOPRAZOLE SODIUM 40 MG PO TBEC
40.0000 mg | DELAYED_RELEASE_TABLET | Freq: Every day | ORAL | Status: DC
  Administered 2021-01-27 – 2021-01-28 (×2): 40 mg via ORAL
  Filled 2021-01-27 (×2): qty 1

## 2021-01-27 MED ORDER — ACETAMINOPHEN 325 MG PO TABS
325.0000 mg | ORAL_TABLET | Freq: Four times a day (QID) | ORAL | Status: DC | PRN
  Administered 2021-01-28: 650 mg via ORAL
  Filled 2021-01-27: qty 2

## 2021-01-27 MED ORDER — OXYCODONE HCL 5 MG PO TABS
10.0000 mg | ORAL_TABLET | ORAL | Status: DC | PRN
  Administered 2021-01-27: 15 mg via ORAL
  Filled 2021-01-27: qty 3
  Filled 2021-01-27: qty 2

## 2021-01-27 MED ORDER — SODIUM CHLORIDE 0.9 % IV SOLN: INTRAVENOUS | Status: DC

## 2021-01-27 MED ORDER — LIDOCAINE 2% (20 MG/ML) 5 ML SYRINGE
INTRAMUSCULAR | Status: AC
  Filled 2021-01-27: qty 5

## 2021-01-27 MED ORDER — LIDOCAINE 2% (20 MG/ML) 5 ML SYRINGE
INTRAMUSCULAR | Status: DC | PRN
  Administered 2021-01-27: 80 mg via INTRAVENOUS

## 2021-01-27 MED ORDER — METHOCARBAMOL 1000 MG/10ML IJ SOLN
500.0000 mg | Freq: Four times a day (QID) | INTRAVENOUS | Status: DC | PRN
  Filled 2021-01-27: qty 5

## 2021-01-27 MED ORDER — METOCLOPRAMIDE HCL 5 MG PO TABS
5.0000 mg | ORAL_TABLET | Freq: Three times a day (TID) | ORAL | Status: DC | PRN
Start: 2021-01-27 — End: 2021-01-28

## 2021-01-27 SURGICAL SUPPLY — 71 items
BANDAGE ESMARK 6X9 LF (GAUZE/BANDAGES/DRESSINGS) ×1 IMPLANT
BLADE SAG 18X100X1.27 (BLADE) ×2 IMPLANT
BNDG CMPR 9X6 STRL LF SNTH (GAUZE/BANDAGES/DRESSINGS) ×1
BNDG ELASTIC 4X5.8 VLCR STR LF (GAUZE/BANDAGES/DRESSINGS) ×2 IMPLANT
BNDG ELASTIC 6X5.8 VLCR STR LF (GAUZE/BANDAGES/DRESSINGS) ×2 IMPLANT
BNDG ESMARK 6X9 LF (GAUZE/BANDAGES/DRESSINGS) ×2
BOWL SMART MIX CTS (DISPOSABLE) ×1 IMPLANT
BSPLAT TIB 3 KN TRITANIUM (Knees) ×1 IMPLANT
COVER SURGICAL LIGHT HANDLE (MISCELLANEOUS) ×2 IMPLANT
COVER WAND RF STERILE (DRAPES) ×1 IMPLANT
CUFF TOURN SGL QUICK 34 (TOURNIQUET CUFF) ×2
CUFF TOURN SGL QUICK 42 (TOURNIQUET CUFF) IMPLANT
CUFF TRNQT CYL 34X4.125X (TOURNIQUET CUFF) ×1 IMPLANT
DRAPE EXTREMITY T 121X128X90 (DISPOSABLE) ×2 IMPLANT
DRAPE HALF SHEET 40X57 (DRAPES) ×2 IMPLANT
DRAPE U-SHAPE 47X51 STRL (DRAPES) ×2 IMPLANT
DRSG PAD ABDOMINAL 8X10 ST (GAUZE/BANDAGES/DRESSINGS) ×2 IMPLANT
DURAPREP 26ML APPLICATOR (WOUND CARE) ×2 IMPLANT
ELECT CAUTERY BLADE 6.4 (BLADE) ×2 IMPLANT
ELECT REM PT RETURN 9FT ADLT (ELECTROSURGICAL) ×2
ELECTRODE REM PT RTRN 9FT ADLT (ELECTROSURGICAL) ×1 IMPLANT
FACESHIELD WRAPAROUND (MASK) ×4 IMPLANT
FEMORAL POSTERIOR SZ3 LT KNEE (Orthopedic Implant) IMPLANT
GAUZE SPONGE 4X4 12PLY STRL (GAUZE/BANDAGES/DRESSINGS) ×2 IMPLANT
GAUZE XEROFORM 1X8 LF (GAUZE/BANDAGES/DRESSINGS) ×1 IMPLANT
GAUZE XEROFORM 5X9 LF (GAUZE/BANDAGES/DRESSINGS) ×1 IMPLANT
GLOVE ORTHO TXT STRL SZ7.5 (GLOVE) ×2 IMPLANT
GLOVE SRG 8 PF TXTR STRL LF DI (GLOVE) ×2 IMPLANT
GLOVE SURG ORTHO 8.0 STRL STRW (GLOVE) ×2 IMPLANT
GLOVE SURG UNDER POLY LF SZ8 (GLOVE) ×4
GOWN STRL REUS W/ TWL LRG LVL3 (GOWN DISPOSABLE) IMPLANT
GOWN STRL REUS W/ TWL XL LVL3 (GOWN DISPOSABLE) ×2 IMPLANT
GOWN STRL REUS W/TWL LRG LVL3 (GOWN DISPOSABLE)
GOWN STRL REUS W/TWL XL LVL3 (GOWN DISPOSABLE) ×4
HANDPIECE INTERPULSE COAX TIP (DISPOSABLE) ×2
IMMOBILIZER KNEE 22 UNIV (SOFTGOODS) ×2 IMPLANT
INSERT TIB TRIATH 12 S3 (Insert) ×2 IMPLANT
KIT BASIN OR (CUSTOM PROCEDURE TRAY) ×2 IMPLANT
KIT TURNOVER KIT B (KITS) ×2 IMPLANT
KNEE PATELLA ASYMMETRIC 9X29 (Knees) ×2 IMPLANT
KNEE TIBIAL COMPONENT SZ3 (Knees) ×1 IMPLANT
MANIFOLD NEPTUNE II (INSTRUMENTS) ×2 IMPLANT
NDL 18GX1X1/2 (RX/OR ONLY) (NEEDLE) IMPLANT
NEEDLE 18GX1X1/2 (RX/OR ONLY) (NEEDLE) IMPLANT
NS IRRIG 1000ML POUR BTL (IV SOLUTION) ×2 IMPLANT
PACK TOTAL JOINT (CUSTOM PROCEDURE TRAY) ×2 IMPLANT
PAD ABD 8X10 STRL (GAUZE/BANDAGES/DRESSINGS) ×1 IMPLANT
PAD ARMBOARD 7.5X6 YLW CONV (MISCELLANEOUS) ×2 IMPLANT
PAD CAST 4YDX4 CTTN HI CHSV (CAST SUPPLIES) IMPLANT
PADDING CAST COTTON 4X4 STRL (CAST SUPPLIES) ×2
PADDING CAST COTTON 6X4 STRL (CAST SUPPLIES) ×2 IMPLANT
PIN FLUTED HEDLESS FIX 3.5X1/8 (PIN) ×1 IMPLANT
POSTERIOR FEMORAL SZ3 LT KNEE (Orthopedic Implant) ×2 IMPLANT
SET HNDPC FAN SPRY TIP SCT (DISPOSABLE) ×1 IMPLANT
SET PAD KNEE POSITIONER (MISCELLANEOUS) ×2 IMPLANT
STAPLER VISISTAT 35W (STAPLE) IMPLANT
STRIP CLOSURE SKIN 1/2X4 (GAUZE/BANDAGES/DRESSINGS) IMPLANT
SUCTION FRAZIER HANDLE 10FR (MISCELLANEOUS) ×2
SUCTION TUBE FRAZIER 10FR DISP (MISCELLANEOUS) ×1 IMPLANT
SUT MNCRL AB 4-0 PS2 18 (SUTURE) IMPLANT
SUT VIC AB 0 CT1 27 (SUTURE) ×2
SUT VIC AB 0 CT1 27XBRD ANBCTR (SUTURE) ×1 IMPLANT
SUT VIC AB 1 CT1 27 (SUTURE) ×4
SUT VIC AB 1 CT1 27XBRD ANBCTR (SUTURE) ×2 IMPLANT
SUT VIC AB 2-0 CT1 27 (SUTURE) ×4
SUT VIC AB 2-0 CT1 TAPERPNT 27 (SUTURE) ×2 IMPLANT
SYR 50ML LL SCALE MARK (SYRINGE) IMPLANT
TOWEL GREEN STERILE (TOWEL DISPOSABLE) ×2 IMPLANT
TOWEL GREEN STERILE FF (TOWEL DISPOSABLE) ×2 IMPLANT
TRAY CATH 16FR W/PLASTIC CATH (SET/KITS/TRAYS/PACK) IMPLANT
WRAP KNEE MAXI GEL POST OP (GAUZE/BANDAGES/DRESSINGS) ×2 IMPLANT

## 2021-01-27 NOTE — Op Note (Signed)
NAMEINGRID, SHIFRIN MEDICAL RECORD NO: [id number] ACCOUNT NO: [id number] DATE OF BIRTH: 1965/09/16 FACILITY: [facility] LOCATION: [facility] PHYSICIAN: Lind Guest. Ninfa Linden, MD  Operative Report   DATE OF PROCEDURE: 01/27/2021  PREOPERATIVE DIAGNOSES:  Primary osteoarthritis and degenerative joint disease, left knee.  POSTOPERATIVE DIAGNOSES:  Primary osteoarthritis and degenerative joint disease, left knee.  PROCEDURE:  Left total knee arthroplasty.  IMPLANTS:  Stryker Triathlon press-fit knee system with size 3 femur, size 3 tibial tray, 12 mm fixed bearing polyethylene insert, size 29 patellar button.  SURGEON:  Lind Guest. Ninfa Linden, MD  ASSISTANT:  Erskine Emery, PA-C  ANESTHESIA: 1.  Left lower extremity adductor canal block. 2.  Spinal.  BLOOD LOSS:  Less than 100 mL.  TOURNIQUET TIME:  Less than 1 hour.  ANTIBIOTICS:  2 grams IV Ancef.  COMPLICATIONS:  None.  INDICATIONS:  The patient is a 55 year old female well known to me.  She has debilitating end-stage arthritis involving her left knee.  She has actually had a right total knee arthroplasty done by one of my colleagues in town years ago and that actually  failed and she needed a revision arthroplasty.  The right knee has been hurting her some, but a lot of this may be due to compensation to the left knee due to the severity of the arthritis of her left knee.  X-rays showed complete loss of the joint space  with varus malalignment and osteophytes in all three compartments.  Her left knee pain has become daily and it is detrimentally affecting her mobility, her quality of life and her activities of daily living to the point she does wish to proceed with  total knee arthroplasty.  We talked in detail about the risk of acute blood loss anemia, nerve and vessel injury, fracture, infection, DVT, implant failure and skin and soft tissue issues.  We talked about our goals being decrease pain, improve mobility  and overall improve  quality of life.  DESCRIPTION OF PROCEDURE:  After informed consent was obtained, appropriate left knee was marked, she was brought to the operating room after an adductor canal block was obtained in the left lower extremity in the holding room. In the operating room,   she was set up on the operating table and spinal anesthesia was obtained.  A Foley catheter was placed and a nonsterile tourniquet was placed around her upper left thigh.  Her left thigh, knee, leg, ankle and foot were prepped and draped with DuraPrep  and sterile drapes.  A timeout was called and she was identified as correct patient, correct left knee.  I then used the Esmarch to wrap out the leg and tourniquet was inflated to 300 mm of pressure.  I then made a direct midline incision over the  patella and carried this proximally and distally.  I dissected down the knee joint and carried out a medial parapatellar arthrotomy, finding a large joint effusion and significant periarticular osteophytes in all three compartments.  With the knee in a  flexed position, we removed all the osteophytes around all three compartments as well as remnants of ACL, PCL, medial and lateral meniscus.  Using the extramedullary cutting guide, setting this for a neutral slope and correcting her varus and valgus, we  made our cutting guide to take 9 mm off the high side.  As we placed our cutting block, we decided to take 2 more mm off of this.  We made our proximal tibial cut without difficulty.  We then used an  intramedullary guide for our distal femoral cutting  guide, setting this for a left knee at 5 degrees externally rotated, for an 8 mm distal femoral cut.  We made that cut without difficulty and brought the knee back down to full extension and removed more remnants of the meniscus from the back of the  knee.  We then placed a 9 mm extension block and we had achieved full extension with maybe just some slight hyperextension.  Attention was then turned  back to the femur.  We placed the femoral sizing guide based off the epicondylar axis and the  Whiteside's line.  Based off of this, we chose a size 3 femur.  We put a 4-in-1 cutting block for a size 3 femur, made our anterior and posterior cuts followed by our chamfer cuts.  We then made our femoral box cut.  Attention was then turned back to the  tibia.  We chose a size 3 tibial tray for coverage, setting our rotation off the tibial tubercle and the femur.  We made our keel punch off of this.  We did all this for press-fit system given her age of only 40 and her not having osteoporosis or being  on osteoporotic medicines.  Also, her bone quality intraoperatively was incredibly dense and seemed well suited for a press-fit implant.  With the trial size 3 femur, we trialed a 3 tibial tray and a 9 mm, then a 11 mm fixed bearing trial polyethylene  insert.  We felt the 12 mm gave the best stability and range of motion.  We then made our patellar cut and drilled three holes for a size 29 press-fit patellar button.  We then removed all instrumentation from the knee and irrigated the knee with normal  saline solution using pulsatile lavage.  We dried the knee real well and did our finishing block on the tibia and then we placed our real Stryker Triathlon tibial tray press-fit size 3 followed by our size 3 press-fit left femur.  We placed our real 12  mm fixed bearing polyethylene insert and press-fit our size 29 patellar button.  We put the knee through several cycles of motion.  We were pleased with range of motion and stability.  We then let the tourniquet down and hemostasis obtained with  electrocautery.  We closed the arthrotomy with interrupted #1 Vicryl suture followed by 0 Vicryl to close the deep tissue and 2-0 Vicryl to close the subcutaneous tissue.  The skin was reapproximated with staples.  Xeroform, well-padded sterile dressing  was applied.  She was taken to recovery room in stable condition with  all final counts being correct.  No complications noted.  Of note, Benita Stabile, PA-C, did assist during the entire case and assistance was crucial for facilitating every aspect of this  case.   SHW D: 01/27/2021 1:36:42 pm T: 01/27/2021 9:34:00 pm  JOB: [id number]/ [id number]

## 2021-01-27 NOTE — H&P (Signed)
TOTAL KNEE ADMISSION H&P  Patient is being admitted for left total knee arthroplasty.  Subjective:  Chief Complaint:left knee pain.  HPI: Cheyenne Graham, 55 y.o. female, has a history of pain and functional disability in the left knee due to arthritis and has failed non-surgical conservative treatments for greater than 12 weeks to includeNSAID's and/or analgesics, corticosteriod injections, viscosupplementation injections, weight reduction as appropriate and activity modification.  Onset of symptoms was gradual, starting 2 years ago with gradually worsening course since that time. The patient noted no past surgery on the left knee(s).  Patient currently rates pain in the left knee(s) at 10 out of 10 with activity. Patient has night pain, worsening of pain with activity and weight bearing, pain that interferes with activities of daily living, pain with passive range of motion, crepitus and joint swelling.  Patient has evidence of subchondral sclerosis, periarticular osteophytes and joint space narrowing by imaging studies. There is no active infection.  Patient Active Problem List   Diagnosis Date Noted  . Unilateral primary osteoarthritis, left knee 11/27/2020  . Primary osteoarthritis of both hands 11/17/2016  . Chronic pain of right knee 10/03/2016  . History of hypertension 10/03/2016  . History of migraine 10/03/2016  . Gastroesophageal reflux disease without esophagitis 10/03/2016  . History of herpes simplex infection 10/03/2016  . History of total knee replacement, right 06/18/2014  . Other chronic postoperative pain 01/25/2014  . Osteoarthritis of left knee 01/25/2014  . Postoperative anemia due to acute blood loss 02/24/2012  . Obesity (BMI 30-39.9) 02/24/2012   Past Medical History:  Diagnosis Date  . Arthritis   . Constipation   . Family history of anesthesia complication    ?  Aunt did not wake up.  Ms Ordaz not sure where she had surgery,l  . GERD (gastroesophageal reflux  disease)    decreased since she is not taking advail  . Herpes simplex   . Hypertension    in the history, resolved after diet changes  . Migraine     2 a week  . Pneumonia       . PONV (postoperative nausea and vomiting)     Past Surgical History:  Procedure Laterality Date  . Fatty tumor    . KNEE ARTHROSCOPY Right   . KNEE SURGERY  02/22/2012  . KNEE SURGERY Right 06/18/2014  . TOTAL KNEE ARTHROPLASTY  02/22/2012   Procedure: TOTAL KNEE ARTHROPLASTY;  Surgeon: Garald Balding, MD;  Location: [place];  Service: Orthopedics;  Laterality: Right;  . TOTAL KNEE REVISION Right 06/18/2014   Procedure: RIGHT TOTAL KNEE REVISION;  Surgeon: Garald Balding, MD;  Location: [place];  Service: Orthopedics;  Laterality: Right;  . TUBAL LIGATION      Current Facility-Administered Medications  Medication Dose Route Frequency Provider Last Rate Last Admin  . ceFAZolin (ANCEF) IVPB 2g/100 mL premix  2 g Intravenous On Call to OR Pete Pelt, PA-C      . lactated ringers infusion   Intravenous Continuous Annye Asa, MD 10 mL/hr at 01/27/21 [place] at 01/27/21 1050  . tranexamic acid (CYKLOKAPRON) IVPB 1,000 mg  1,000 mg Intravenous To OR Pete Pelt, PA-C       Allergies  Allergen Reactions  . Shrimp [Shellfish Allergy] Nausea And Vomiting    "makes me feel like I 'm going to die"    Social History   Tobacco Use  . Smoking status: Never Smoker  . Smokeless tobacco: Never Used  Substance  Use Topics  . Alcohol use: Yes    Alcohol/week: 0.0 standard drinks    Comment: occasional    Family History  Problem Relation Age of Onset  . Hypertension Mother   . Cancer Mother   . Hypertension Father   . Asthma Sister   . Migraines Sister   . Colon cancer Neg Hx      Review of Systems  Musculoskeletal: Positive for gait problem and joint swelling.  All other systems reviewed and are negative.   Objective:  Physical Exam Vitals reviewed.  Constitutional:       Appearance: Normal appearance.  HENT:     Head: Normocephalic and atraumatic.  Eyes:     Extraocular Movements: Extraocular movements intact.     Pupils: Pupils are equal, round, and reactive to light.  Cardiovascular:     Rate and Rhythm: Normal rate and regular rhythm.     Pulses: Normal pulses.  Pulmonary:     Breath sounds: Normal breath sounds.  Abdominal:     Palpations: Abdomen is soft.  Musculoskeletal:     Cervical back: Normal range of motion.     Left knee: Effusion, bony tenderness and crepitus present. Decreased range of motion. Tenderness present over the medial joint line, lateral joint line and patellar tendon. Abnormal alignment and abnormal meniscus.  Neurological:     Mental Status: She is alert and oriented to person, place, and time.  Psychiatric:        Behavior: Behavior normal.     Vital signs in last 24 hours: Temp:  [98.1 F (36.7 C)] 98.1 F (36.7 C) (05/24 1027) Pulse Rate:  [76-80] 80 (05/24 1115) Resp:  [11-21] 13 (05/24 1115) BP: (117-148)/(67-78) 117/67 (05/24 1115) SpO2:  [96 %-100 %] 98 % (05/24 1115) Weight:  [85.7 kg] 85.7 kg (05/24 1027)  Labs:   Estimated body mass index is 31.45 kg/m as calculated from the following:   Height as of this encounter: 5\' 5"  (1.651 m).   Weight as of this encounter: 85.7 kg.   Imaging Review Plain radiographs demonstrate severe degenerative joint disease of the left knee(s). The overall alignment ismild varus. The bone quality appears to be excellent for age and reported activity level.      Assessment/Plan:  End stage arthritis, left knee   The patient history, physical examination, clinical judgment of the provider and imaging studies are consistent with end stage degenerative joint disease of the left knee(s) and total knee arthroplasty is deemed medically necessary. The treatment options including medical management, injection therapy arthroscopy and arthroplasty were discussed at length. The  risks and benefits of total knee arthroplasty were presented and reviewed. The risks due to aseptic loosening, infection, stiffness, patella tracking problems, thromboembolic complications and other imponderables were discussed. The patient acknowledged the explanation, agreed to proceed with the plan and consent was signed. Patient is being admitted for inpatient treatment for surgery, pain control, PT, OT, prophylactic antibiotics, VTE prophylaxis, progressive ambulation and ADL's and discharge planning. The patient is planning to be discharged home with home health services

## 2021-01-27 NOTE — Brief Op Note (Signed)
01/27/2021  1:38 PM  PATIENT:  [name]  55 y.o. female  PRE-OPERATIVE DIAGNOSIS:  osteoarthritis left knee  POST-OPERATIVE DIAGNOSIS:  osteoarthritis left knee  PROCEDURE:  Procedure(s): LEFT TOTAL KNEE ARTHROPLASTY (Left)  SURGEON:  Surgeon(s) and Role:    Mcarthur Rossetti, MD - Primary  PHYSICIAN ASSISTANT:  Benita Stabile, PA-C  ANESTHESIA:  Regional and spinal  EBL:  100 mL   COUNTS:  YES  TOURNIQUET:   Total Tourniquet Time Documented: Thigh (Left) - 44 minutes Total: Thigh (Left) - 44 minutes   DICTATION: .Other Dictation: Dictation Number [id number]  PLAN OF CARE: Admit for overnight observation  PATIENT DISPOSITION:  PACU - hemodynamically stable.   Delay start of Pharmacological VTE agent (>24hrs) due to surgical blood loss or risk of bleeding: no

## 2021-01-27 NOTE — Transfer of Care (Signed)
Immediate Anesthesia Transfer of Care Note  Patient: Cheyenne Graham  Procedure(s) Performed: LEFT TOTAL KNEE ARTHROPLASTY (Left Knee)  Patient Location: PACU  Anesthesia Type:Spinal  Level of Consciousness: awake, alert , oriented and patient cooperative  Airway & Oxygen Therapy: Patient Spontanous Breathing and Patient connected to face mask oxygen  Post-op Assessment: Report given to RN and Post -op Vital signs reviewed and stable  Post vital signs: Reviewed and stable  Last Vitals:  Vitals Value Taken Time  BP    Temp    Pulse 81 01/27/21 1356  Resp 14 01/27/21 1356  SpO2 100 % 01/27/21 1356  Vitals shown include unvalidated device data.  Last Pain:  Vitals:   01/27/21 1115  TempSrc:   PainSc: Asleep      Patients Stated Pain Goal: 4 (78/41/28 2081)  Complications: No complications documented.

## 2021-01-27 NOTE — Progress Notes (Signed)
PT Cancellation Note  Patient Details Name: Cheyenne Graham MRN: [id number] DOB: 22-Dec-1965   Cancelled Treatment:    Reason Eval/Treat Not Completed: Other (comment) Pt with block effects still present and not appropriate to work with PT. Will follow up as schedule allows.   Lou Miner, DPT  Acute Rehabilitation Services  Pager: [phone] Office: [phone]  Rudean Hitt 01/27/2021, 4:51 PM

## 2021-01-27 NOTE — Anesthesia Procedure Notes (Signed)
Anesthesia Regional Block: Adductor canal block   Pre-Anesthetic Checklist: ,, timeout performed, Correct Patient, Correct Site, Correct Laterality, Correct Procedure, Correct Position, site marked, Risks and benefits discussed, pre-op evaluation,  At surgeon's request and post-op pain management  Laterality: Left  Prep: Maximum Sterile Barrier Precautions used, chloraprep       Needles:  Injection technique: Single-shot  Needle Type: Echogenic Stimulator Needle     Needle Length: 9cm  Needle Gauge: 21     Additional Needles:   Procedures:,,,, ultrasound used (permanent image in chart),,,,  Narrative:  Start time: 01/27/2021 10:55 AM End time: 01/27/2021 11:05 AM Injection made incrementally with aspirations every 5 mL.  Performed by: Personally  Anesthesiologist: Roberts Gaudy, MD  Additional Notes: 25 cc 0.5% Bupivacaine 1:200 Epi 10 cc 1.3% Exparel

## 2021-01-27 NOTE — Anesthesia Procedure Notes (Signed)
Spinal  Patient location during procedure: OR Start time: 01/27/2021 12:06 PM End time: 01/27/2021 12:10 PM Reason for block: surgical anesthesia Staffing Performed: anesthesiologist  Anesthesiologist: Albertha Ghee, MD Preanesthetic Checklist Completed: patient identified, IV checked, risks and benefits discussed, surgical consent, monitors and equipment checked, pre-op evaluation and timeout performed Spinal Block Patient position: sitting Prep: DuraPrep Patient monitoring: cardiac monitor, continuous pulse ox and blood pressure Approach: midline Injection technique: single-shot Needle Needle type: Pencan  Needle gauge: 24 G Needle length: 9 cm Assessment Sensory level: T10 Events: CSF return Additional Notes Functioning IV was confirmed and monitors were applied. Sterile prep and drape, including hand hygiene and sterile gloves were used. The patient was positioned and the spine was prepped. The skin was anesthetized with lidocaine.  Free flow of clear CSF was obtained prior to injecting local anesthetic into the CSF.  The spinal needle aspirated freely following injection.  The needle was carefully withdrawn.  The patient tolerated the procedure well.

## 2021-01-28 DIAGNOSIS — M1712 Unilateral primary osteoarthritis, left knee: Secondary | ICD-10-CM | POA: Diagnosis not present

## 2021-01-28 LAB — CBC
HCT: 33.8 % — ABNORMAL LOW (ref 36.0–46.0)
Hemoglobin: 11.3 g/dL — ABNORMAL LOW (ref 12.0–15.0)
MCH: 28.6 pg (ref 26.0–34.0)
MCHC: 33.4 g/dL (ref 30.0–36.0)
MCV: 85.6 fL (ref 80.0–100.0)
Platelets: 265 10*3/uL (ref 150–400)
RBC: 3.95 MIL/uL (ref 3.87–5.11)
RDW: 12.6 % (ref 11.5–15.5)
WBC: 5.8 10*3/uL (ref 4.0–10.5)
nRBC: 0 % (ref 0.0–0.2)

## 2021-01-28 LAB — BASIC METABOLIC PANEL
Anion gap: 8 (ref 5–15)
BUN: 11 mg/dL (ref 6–20)
CO2: 22 mmol/L (ref 22–32)
Calcium: 8.1 mg/dL — ABNORMAL LOW (ref 8.9–10.3)
Chloride: 106 mmol/L (ref 98–111)
Creatinine, Ser: 0.65 mg/dL (ref 0.44–1.00)
GFR, Estimated: >60 mL/min (ref >60)
Glucose, Bld: 115 mg/dL — ABNORMAL HIGH (ref 70–99)
Potassium: 3.7 mmol/L (ref 3.5–5.1)
Sodium: 136 mmol/L (ref 135–145)

## 2021-01-28 MED ORDER — METHOCARBAMOL 500 MG PO TABS: 500.0000 mg | ORAL_TABLET | Freq: Four times a day (QID) | ORAL | 1 refills | Status: DC | PRN

## 2021-01-28 MED ORDER — OXYCODONE HCL 5 MG PO TABS: 5.0000 mg | ORAL_TABLET | ORAL | 0 refills | Status: DC | PRN

## 2021-01-28 MED ORDER — ASPIRIN 81 MG PO CHEW: 81.0000 mg | CHEWABLE_TABLET | Freq: Two times a day (BID) | ORAL | 0 refills | Status: DC

## 2021-01-28 NOTE — Discharge Summary (Signed)
Patient ID: Cheyenne Graham MRN: [id number] DOB/AGE: [id number] y.o.  Admit date: 01/27/2021 Discharge date: 01/28/2021  Admission Diagnoses:  Principal Problem:   Unilateral primary osteoarthritis, left knee Active Problems:   Status post total left knee replacement   Discharge Diagnoses:  Same  Past Medical History:  Diagnosis Date  . Arthritis   . Constipation   . Family history of anesthesia complication    ?  Aunt did not wake up.  [name] not sure where she had surgery,l  . GERD (gastroesophageal reflux disease)    decreased since she is not taking advail  . Herpes simplex   . Hypertension    in the history, resolved after diet changes  . Migraine     2 a week  . Pneumonia       . PONV (postoperative nausea and vomiting)     Surgeries: Procedure(s): LEFT TOTAL KNEE ARTHROPLASTY on 01/27/2021   Consultants:   Discharged Condition: Improved  Hospital Course: ASHELEY Graham is an 55 y.o. female who was admitted 01/27/2021 for operative treatment ofUnilateral primary osteoarthritis, left knee. Patient has severe unremitting pain that affects sleep, daily activities, and work/hobbies. After pre-op clearance the patient was taken to the operating room on 01/27/2021 and underwent  Procedure(s): LEFT TOTAL KNEE ARTHROPLASTY.    Patient was given perioperative antibiotics:  Anti-infectives (From admission, onward)   Start     Dose/Rate Route Frequency Ordered Stop   01/27/21 1800  ceFAZolin (ANCEF) IVPB 1 g/50 mL premix        1 g 100 mL/hr over 30 Minutes Intravenous Every 6 hours 01/27/21 1649 01/28/21 0103   01/27/21 1030  ceFAZolin (ANCEF) IVPB 2g/100 mL premix        2 g 200 mL/hr over 30 Minutes Intravenous On call to O.R. 01/27/21 1018 01/27/21 1222       Patient was given sequential compression devices, early ambulation, and chemoprophylaxis to prevent DVT.  Patient benefited maximally from hospital stay and there were no complications.    Recent vital  signs:  Patient Vitals for the past 24 hrs:  BP Temp Temp src Pulse Resp SpO2 Height Weight  01/28/21 0548 126/78 99.1 F (37.3 C) Oral 95 18 99 % -- --  01/28/21 0037 122/83 99.8 F (37.7 C) Oral 97 18 97 % -- --  01/27/21 2028 126/71 98.3 F (36.8 C) Oral 88 18 98 % -- --  01/27/21 1653 (!) 142/77 97.9 F (36.6 C) Oral 69 20 99 % -- --  01/27/21 1525 136/79 -- -- 62 15 100 % -- --  01/27/21 1455 126/80 -- -- (!) 56 12 100 % -- --  01/27/21 1425 123/80 -- -- (!) 57 10 100 % -- --  01/27/21 1410 127/88 -- -- (!) 58 16 -- -- --  01/27/21 1358 -- -- -- -- -- 100 % -- --  01/27/21 1355 127/88 98 F (36.7 C) -- 60 18 100 % -- --  01/27/21 1115 117/67 -- -- 80 13 98 % -- --  01/27/21 1110 121/68 -- -- 76 16 97 % -- --  01/27/21 1105 129/70 -- -- 76 11 96 % -- --  01/27/21 1100 (!) 148/77 -- -- 77 (!) 21 100 % -- --  01/27/21 1027 140/78 98.1 F (36.7 C) Oral 79 18 99 % 5\' 5"  (1.651 m) 85.7 kg     Recent laboratory studies:  Recent Labs    01/28/21 0635  WBC 5.8  HGB 11.3*  HCT 33.8*  PLT 265     Discharge Medications:   Allergies as of 01/28/2021      Reactions   Shrimp [shellfish Allergy] Nausea And Vomiting   "makes me feel like I 'm going to die"      Medication List    STOP taking these medications   acetaminophen-codeine 300-30 MG tablet Commonly known as: TYLENOL #3     TAKE these medications   aspirin 81 MG chewable tablet Chew 1 tablet (81 mg total) by mouth 2 (two) times daily.   ibuprofen 200 MG tablet Commonly known as: ADVIL Take 800 mg by mouth every 8 (eight) hours as needed (pain).   methocarbamol 500 MG tablet Commonly known as: ROBAXIN Take 1 tablet (500 mg total) by mouth every 6 (six) hours as needed for muscle spasms.   multivitamin with minerals tablet Take 1 tablet by mouth in the morning.   oxyCODONE 5 MG immediate release tablet Commonly known as: Oxy IR/ROXICODONE Take 1-2 tablets (5-10 mg total) by mouth every 4 (four) hours as  needed for moderate pain (pain score 4-6).   valACYclovir 1000 MG tablet Commonly known as: VALTREX Take 1,000 mg by mouth 2 (two) times daily as needed (cold sores).            Durable Medical Equipment  (From admission, onward)         Start     Ordered   01/27/21 1650  DME 3 n 1  Once        01/27/21 1649   01/27/21 1650  DME Walker rolling  Once       Question Answer Comment  Walker: With 5 Inch Wheels   Patient needs a walker to treat with the following condition Status post total left knee replacement      01/27/21 1649          Diagnostic Studies: DG Knee Left Port  Result Date: 01/27/2021 CLINICAL DATA:  Left knee replacement EXAM: PORTABLE LEFT KNEE - 1-2 VIEW COMPARISON:  10/08/2020 FINDINGS: Left knee replacement in satisfactory position alignment. Gas and fluid in the knee joint. No fracture or complication. IMPRESSION: Satisfactory left knee replacement. Electronically Signed   By: Franchot Gallo M.D.   On: 01/27/2021 14:48    Disposition: Discharge disposition: 01-Home or [place]    Mcarthur Rossetti, MD Follow up in 2 week(s).   Specialty: Orthopedic Surgery Contact information: [address] [place] [address] [phone]                Signed: Mcarthur Rossetti 01/28/2021, 7:47 AM

## 2021-01-28 NOTE — Anesthesia Postprocedure Evaluation (Signed)
Anesthesia Post Note  Patient: Cheyenne Graham  Procedure(s) Performed: LEFT TOTAL KNEE ARTHROPLASTY (Left Knee)     Patient location during evaluation: PACU Anesthesia Type: Regional and Spinal Level of consciousness: oriented and awake and alert Pain management: pain level controlled Vital Signs Assessment: post-procedure vital signs reviewed and stable Respiratory status: spontaneous breathing, respiratory function stable and patient connected to nasal cannula oxygen Cardiovascular status: blood pressure returned to baseline and stable Postop Assessment: no headache, no backache and no apparent nausea or vomiting Anesthetic complications: no   No complications documented.  Last Vitals:  Vitals:   01/28/21 0548 01/28/21 0848  BP: 126/78 (!) 142/78  Pulse: 95 98  Resp: 18 16  Temp: 37.3 C 36.7 C  SpO2: 99% 98%    Last Pain:  Vitals:   01/28/21 0848  TempSrc: Oral  PainSc:                  [place] S

## 2021-01-28 NOTE — Evaluation (Signed)
Physical Therapy Evaluation Patient Details Name: Cheyenne Graham MRN: [id number] DOB: 1966/06/03 Today's Date: 01/28/2021   History of Present Illness  Pt is a 55 y/o female who presents s/p L TKR on 01/27/2021. PMH significant for R TKR on 2013 with manipulation and then revision later in 2015, HTN, herpes.    Clinical Impression  Pt is s/p TKA resulting in the deficits listed below (see PT Problem List). At the time of PT eval pt was able to perform transfers and ambulation with gross min guard assist and RW for support. Anticipate pt will progress well. HEP education initiated and handout provided. Pt will benefit from skilled PT to increase their independence and safety with mobility to allow discharge to the venue listed below.      Follow Up Recommendations Follow surgeon's recommendation for DC plan and follow-up therapies    Equipment Recommendations  Rolling walker with 5" wheels;3in1 (PT)    Recommendations for Other Services       Precautions / Restrictions Precautions Precautions: Fall;Knee Precaution Booklet Issued: Yes (comment) Precaution Comments: Pt was educated on no pillow, roll, ice pack under knee, only under heel. Restrictions Weight Bearing Restrictions: Yes LLE Weight Bearing: Weight bearing as tolerated      Mobility  Bed Mobility Overal bed mobility: Needs Assistance Bed Mobility: Supine to Sit     Supine to sit: Min assist     General bed mobility comments: Assist for LLE to advance to EOB. Pt able to transition around to get feet on the floor without assist.    Transfers Overall transfer level: Needs assistance Equipment used: Rolling walker (2 wheeled) Transfers: Sit to/from Stand Sit to Stand: Min guard         General transfer comment: VC's for hand placement on seated surface for safety. Hands on guarding.  Ambulation/Gait Ambulation/Gait assistance: Min guard Gait Distance (Feet): 200 Feet Assistive device: Rolling walker (2  wheeled) Gait Pattern/deviations: Decreased stride length;Trunk flexed;Decreased dorsiflexion - left;Decreased weight shift to left;Step-to pattern;Step-through pattern Gait velocity: Decreased Gait velocity interpretation: <1.31 ft/sec, indicative of household ambulator General Gait Details: VC's for improved posture, increased heel strike on the L, and sequencing with the RW. Progressed to step-through gait pattern. No knee buckling noted.  Stairs            Wheelchair Mobility    Modified Rankin (Stroke Patients Only)       Balance Overall balance assessment: Needs assistance Sitting-balance support: Feet supported;No upper extremity supported Sitting balance-Leahy Scale: Fair     Standing balance support: Bilateral upper extremity supported;During functional activity Standing balance-Leahy Scale: Poor                               Pertinent Vitals/Pain Pain Assessment: 0-10 Pain Score: 6  Pain Location: L Knee Pain Descriptors / Indicators: Operative site guarding;Aching Pain Intervention(s): Limited activity within patient's tolerance;Monitored during session;Repositioned    Home Living Family/patient expects to be discharged to:: Private residence Living Arrangements: Children;Other relatives (Sister temporarily) Available Help at Discharge: Family;Available 24 hours/day Type of Home: House Home Access: Stairs to enter Entrance Stairs-Rails: None Entrance Stairs-Number of Steps: 1 Home Layout: Two level;Able to live on main level with bedroom/bathroom Home Equipment: Kasandra Knudsen - single point      Prior Function Level of Independence: Independent         Comments: Utilizing cane     Hand Dominance  Extremity/Trunk Assessment   Upper Extremity Assessment Upper Extremity Assessment: Overall WFL for tasks assessed    Lower Extremity Assessment Lower Extremity Assessment: LLE deficits/detail LLE Deficits / Details: Decreased  strength and AROM consistent with pre-op diagnosis LLE Sensation: decreased light touch    Cervical / Trunk Assessment Cervical / Trunk Assessment: Normal  Communication   Communication: No difficulties  Cognition Arousal/Alertness: Awake/alert Behavior During Therapy: WFL for tasks assessed/performed Overall Cognitive Status: Within Functional Limits for tasks assessed                                        General Comments      Exercises Total Joint Exercises Ankle Circles/Pumps: 10 reps Quad Sets: 15 reps Heel Slides: 15 reps Hip ABduction/ADduction: 15 reps Straight Leg Raises: 10 reps Goniometric ROM: <30 flexion actively   Assessment/Plan    PT Assessment Patient needs continued PT services  PT Problem List Decreased strength;Decreased range of motion;Decreased activity tolerance;Decreased balance;Decreased mobility;Decreased knowledge of use of DME;Decreased safety awareness;Decreased knowledge of precautions;Pain       PT Treatment Interventions DME instruction;Gait training;Stair training;Functional mobility training;Therapeutic activities;Therapeutic exercise;Neuromuscular re-education;Patient/family education    PT Goals (Current goals can be found in the Care Plan section)  Acute Rehab PT Goals Patient Stated Goal: Home today, recover well without need for another manipulation like she had with her other knee replacement PT Goal Formulation: With patient Time For Goal Achievement: 02/04/21 Potential to Achieve Goals: Good    Frequency 7X/week   Barriers to discharge        Co-evaluation               AM-PAC PT "6 Clicks" Mobility  Outcome Measure Help needed turning from your back to your side while in a flat bed without using bedrails?: None Help needed moving from lying on your back to sitting on the side of a flat bed without using bedrails?: A Little Help needed moving to and from a bed to a chair (including a wheelchair)?:  A Little Help needed standing up from a chair using your arms (e.g., wheelchair or bedside chair)?: A Little Help needed to walk in hospital room?: A Little Help needed climbing 3-5 steps with a railing? : A Little 6 Click Score: 19    End of Session Equipment Utilized During Treatment: Gait belt Activity Tolerance: Patient tolerated treatment well Patient left: in chair;with call bell/phone within reach Nurse Communication: Mobility status PT Visit Diagnosis: Unsteadiness on feet (R26.81);Pain Pain - Right/Left: Left Pain - part of body: Knee    Time: 0727-0811 PT Time Calculation (min) (ACUTE ONLY): 44 min   Charges:   PT Evaluation $PT Eval Low Complexity: 1 Low PT Treatments $Gait Training: 8-22 mins $Therapeutic Exercise: 8-22 mins        Rolinda Roan, PT, DPT Acute Rehabilitation Services Pager: [phone] Office: [phone]   Thelma Comp 01/28/2021, 9:11 AM

## 2021-01-28 NOTE — Discharge Instructions (Signed)

## 2021-01-28 NOTE — Progress Notes (Signed)
Patient alert and oriented, voiding adequately, skin clean, dry and intact without evidence of skin break down, or symptoms of complications - no redness or edema noted, only slight tenderness at site.  Patient states pain is manageable at time of discharge. Patient has an appointment with MD in 2 weeks 

## 2021-01-28 NOTE — Progress Notes (Signed)
Subjective: 1 Day Post-Op Procedure(s) (LRB): LEFT TOTAL KNEE ARTHROPLASTY (Left) Patient reports pain as moderate.    Objective: Vital signs in last 24 hours: Temp:  [97.9 F (36.6 C)-99.8 F (37.7 C)] 99.1 F (37.3 C) (05/25 0548) Pulse Rate:  [56-97] 95 (05/25 0548) Resp:  [10-21] 18 (05/25 0548) BP: (117-148)/(67-88) 126/78 (05/25 0548) SpO2:  [96 %-100 %] 99 % (05/25 0548) Weight:  [85.7 kg] 85.7 kg (05/24 1027)  Intake/Output from previous day: 05/24 0701 - 05/25 0700 In: 1000 [I.V.:800; IV Piggyback:200] Out: 4081 [Urine:1550; Blood:100] Intake/Output this shift: No intake/output data recorded.  Recent Labs    01/28/21 0635  HGB 11.3*   Recent Labs    01/28/21 0635  WBC 5.8  RBC 3.95  HCT 33.8*  PLT 265   No results for input(s): NA, K, CL, CO2, BUN, CREATININE, GLUCOSE, CALCIUM in the last 72 hours. No results for input(s): LABPT, INR in the last 72 hours.  Sensation intact distally Intact pulses distally Dorsiflexion/Plantar flexion intact Incision: dressing C/D/I No cellulitis present Compartment soft   Assessment/Plan: 1 Day Post-Op Procedure(s) (LRB): LEFT TOTAL KNEE ARTHROPLASTY (Left) Up with therapy Discharge home with home health    Patient's anticipated LOS is less than 2 midnights, meeting these requirements: - Younger than 34 - Lives within 1 hour of care - Has a competent adult at home to recover with post-op recover - NO history of  - Chronic pain requiring opiods  - Diabetes  - Coronary Artery Disease  - Heart failure  - Heart attack  - Stroke  - DVT/VTE  - Cardiac arrhythmia  - Respiratory Failure/COPD  - Renal failure  - Anemia  - Advanced Liver disease       Mcarthur Rossetti 01/28/2021, 7:45 AM

## 2021-01-28 NOTE — Plan of Care (Signed)
Adequately Ready For Discharge

## 2021-01-28 NOTE — Progress Notes (Signed)
Physical Therapy Treatment Patient Details Name: Cheyenne Graham MRN: [id number] DOB: [birth year] Today's Date: 01/28/2021    History of Present Illness Pt is a 55 y/o female who presents s/p L TKR on 01/27/2021. PMH significant for R TKR on 2013 with manipulation and then revision later in 2015, HTN, herpes.    PT Comments    Pt progressing towards physical therapy goals. Was able to perform transfers and ambulation with gross min guard assist and RW for support. VC's throughout for optimal posture and technique with gait training and RW use. Adjusted pt's walker to appropriate height prior to end of session. Pt anticipates d/c home this afternoon. Recommended pt time her car ride home with pain meds as pt reports increased pain towards the 3-4 hour mark after she has gotten pain meds. Pt was educated on positioning recommendations, activity progression, HEP, and general safety with mobility at home. Will continue to follow and progress as able per POC.     Follow Up Recommendations  Follow surgeon's recommendation for DC plan and follow-up therapies     Equipment Recommendations  Rolling walker with 5" wheels;3in1 (PT)    Recommendations for Other Services       Precautions / Restrictions Precautions Precautions: Fall;Knee Precaution Booklet Issued: Yes (comment) Precaution Comments: Pt was educated on no pillow, roll, ice pack under knee, only under heel. Restrictions Weight Bearing Restrictions: Yes LLE Weight Bearing: Weight bearing as tolerated    Mobility  Bed Mobility Overal bed mobility: Needs Assistance Bed Mobility: Supine to Sit;Sit to Supine     Supine to sit: Min assist Sit to supine: Min assist   General bed mobility comments: Assist for LE elevation back up into bed as well as for advancement out to EOB during practice trials.    Transfers Overall transfer level: Needs assistance Equipment used: Rolling walker (2 wheeled) Transfers: Sit to/from Stand Sit to  Stand: Min guard         General transfer comment: VC's for hand placement on seated surface for safety. Hands on guarding.  Ambulation/Gait Ambulation/Gait assistance: Min guard Gait Distance (Feet): 250 Feet Assistive device: Rolling walker (2 wheeled) Gait Pattern/deviations: Decreased stride length;Trunk flexed;Decreased dorsiflexion - left;Decreased weight shift to left;Step-to pattern;Step-through pattern Gait velocity: Decreased Gait velocity interpretation: <1.31 ft/sec, indicative of household ambulator General Gait Details: VC's for improved posture, increased heel strike on the L, and sequencing with the RW. Progressed to step-through gait pattern. No knee buckling noted.   Stairs             Wheelchair Mobility    Modified Rankin (Stroke Patients Only)       Balance Overall balance assessment: Needs assistance Sitting-balance support: Feet supported;No upper extremity supported Sitting balance-Leahy Scale: Fair     Standing balance support: Bilateral upper extremity supported;During functional activity Standing balance-Leahy Scale: Poor                              Cognition Arousal/Alertness: Awake/alert Behavior During Therapy: WFL for tasks assessed/performed Overall Cognitive Status: Within Functional Limits for tasks assessed                                        Exercises Total Joint Exercises Heel Slides: 20 reps Hip ABduction/ADduction: 5 reps (functionally in bed) Long Arc Quad: 5 reps Goniometric ROM: 55 L  knee flexion AROM    General Comments        Pertinent Vitals/Pain Pain Assessment: Faces Faces Pain Scale: Hurts whole lot Pain Location: L Knee Pain Descriptors / Indicators: Operative site guarding;Aching Pain Intervention(s): Limited activity within patient's tolerance;Monitored during session;Repositioned    Home Living Family/patient expects to be discharged to:: Private residence Living  Arrangements: Children;Other relatives (Sister temporarily) Available Help at Discharge: Family;Available 24 hours/day Type of Home: House Home Access: Stairs to enter Entrance Stairs-Rails: None Home Layout: Two level;Able to live on main level with bedroom/bathroom Home Equipment: Kasandra Knudsen - single point      Prior Function Level of Independence: Independent      Comments: Utilizing cane   PT Goals (current goals can now be found in the care plan section) Acute Rehab PT Goals Patient Stated Goal: Home today, recover well without need for another manipulation like she had with her other knee replacement PT Goal Formulation: With patient Time For Goal Achievement: 02/04/21 Potential to Achieve Goals: Good Progress towards PT goals: Progressing toward goals    Frequency    7X/week      PT Plan Current plan remains appropriate    Co-evaluation              AM-PAC PT "6 Clicks" Mobility   Outcome Measure  Help needed turning from your back to your side while in a flat bed without using bedrails?: None Help needed moving from lying on your back to sitting on the side of a flat bed without using bedrails?: A Little Help needed moving to and from a bed to a chair (including a wheelchair)?: A Little Help needed standing up from a chair using your arms (e.g., wheelchair or bedside chair)?: A Little Help needed to walk in hospital room?: A Little Help needed climbing 3-5 steps with a railing? : A Little 6 Click Score: 19    End of Session Equipment Utilized During Treatment: Gait belt Activity Tolerance: Patient tolerated treatment well Patient left: in chair;with call bell/phone within reach Nurse Communication: Mobility status PT Visit Diagnosis: Unsteadiness on feet (R26.81);Pain Pain - Right/Left: Left Pain - part of body: Knee     Time: 3662-9476 PT Time Calculation (min) (ACUTE ONLY): 44 min  Charges:  $Gait Training: 23-37 mins $Therapeutic Exercise: 8-22  mins                     Rolinda Roan, PT, DPT Acute Rehabilitation Services Pager: [phone] Office: [phone]    Thelma Comp 01/28/2021, 1:19 PM

## 2021-01-29 ENCOUNTER — Encounter (HOSPITAL_COMMUNITY): Payer: Self-pay | Admitting: Orthopaedic Surgery

## 2021-01-29 NOTE — Anesthesia Postprocedure Evaluation (Signed)
Anesthesia Post Note  Patient: Cheyenne Graham  Procedure(s) Performed: LEFT TOTAL KNEE ARTHROPLASTY (Left Knee)     Patient location during evaluation: PACU Anesthesia Type: Regional and Spinal Level of consciousness: oriented and awake and alert Pain management: pain level controlled Vital Signs Assessment: post-procedure vital signs reviewed and stable Respiratory status: spontaneous breathing, respiratory function stable and patient connected to nasal cannula oxygen Cardiovascular status: blood pressure returned to baseline and stable Postop Assessment: no headache, no backache and no apparent nausea or vomiting Anesthetic complications: no   No complications documented.  Last Vitals:  Vitals:   01/28/21 0548 01/28/21 0848  BP: 126/78 (!) 142/78  Pulse: 95 98  Resp: 18 16  Temp: 37.3 C 36.7 C  SpO2: 99% 98%    Last Pain:  Vitals:   01/28/21 1303  TempSrc:   PainSc: [place]

## 2021-02-01 ENCOUNTER — Other Ambulatory Visit: Payer: Self-pay | Admitting: Specialist

## 2021-02-01 MED ORDER — OXYCODONE HCL 5 MG PO TABS: 5.0000 mg | ORAL_TABLET | ORAL | 0 refills | Status: DC | PRN

## 2021-02-05 ENCOUNTER — Other Ambulatory Visit: Payer: Self-pay | Admitting: Orthopaedic Surgery

## 2021-02-06 ENCOUNTER — Telehealth: Payer: Self-pay | Admitting: Orthopaedic Surgery

## 2021-02-06 MED ORDER — OXYCODONE HCL 5 MG PO TABS: 5.0000 mg | ORAL_TABLET | Freq: Four times a day (QID) | ORAL | 0 refills | Status: DC | PRN

## 2021-02-06 NOTE — Telephone Encounter (Signed)
I have started a prior auth

## 2021-02-06 NOTE — Telephone Encounter (Signed)
Patient called needing Rx refilled (Oxycodone)   Patient said the medication will need prior approval. The number to contact patient is [phone]

## 2021-02-06 NOTE — Telephone Encounter (Signed)
I will at least send it in since she is post a knee replacement

## 2021-02-09 ENCOUNTER — Encounter (HOSPITAL_COMMUNITY): Payer: Self-pay | Admitting: Orthopaedic Surgery

## 2021-02-09 NOTE — Anesthesia Preprocedure Evaluation (Signed)
Anesthesia Evaluation  Patient identified by MRN, date of birth, ID band Patient awake    Reviewed: Allergy & Precautions, NPO status , Patient's Chart, lab work & pertinent test results  Airway Mallampati: II       Dental   Pulmonary    breath sounds clear to auscultation       Cardiovascular hypertension,  Rhythm:Regular Rate:Normal     Neuro/Psych    GI/Hepatic   Endo/Other    Renal/GU      Musculoskeletal   Abdominal   Peds  Hematology   Anesthesia Other Findings   Reproductive/Obstetrics                             Anesthesia Physical Anesthesia Plan  ASA: III  Anesthesia Plan: MAC and Spinal   Post-op Pain Management:    Induction: Intravenous  PONV Risk Score and Plan:   Airway Management Planned: Natural Airway and Simple Face Mask  Additional Equipment:   Intra-op Plan:   Post-operative Plan:   Informed Consent: I have reviewed the patients History and Physical, chart, labs and discussed the procedure including the risks, benefits and alternatives for the proposed anesthesia with the patient or authorized representative who has indicated his/her understanding and acceptance.       Plan Discussed with: Anesthesiologist and CRNA  Anesthesia Plan Comments:         Anesthesia Quick Evaluation

## 2021-02-10 ENCOUNTER — Other Ambulatory Visit: Payer: Self-pay

## 2021-02-10 ENCOUNTER — Ambulatory Visit (INDEPENDENT_AMBULATORY_CARE_PROVIDER_SITE_OTHER): Payer: Federal, State, Local not specified - PPO | Admitting: Orthopaedic Surgery

## 2021-02-10 ENCOUNTER — Encounter: Payer: Self-pay | Admitting: Orthopaedic Surgery

## 2021-02-10 DIAGNOSIS — Z96652 Presence of left artificial knee joint: Secondary | ICD-10-CM

## 2021-02-10 NOTE — Progress Notes (Signed)
The patient is 2 weeks status post a left total knee arthroplasty.  She said the knee is aching and she is not sleeping well but overall she is doing as well as can be expected.  She has been only able to flex her left knee to about 75 degrees.  She has been in home therapy and we need to transition her to outpatient physical therapy.  She is ambulating with a cane.  She is on a oxycodone or muscle relaxant as well as been taking a baby aspirin twice a day.  On examination of her left knee the incision looks good.  The staples been removed and Steri-Strips applied.  She lacks full extension of about 3 degrees but I can only flex her to about 75 degrees.  Her calf is soft and her foot is well-perfused.  We need to send her to outpatient physical therapy at this standpoint to work on getting the knee moving.  We will refill her medications as needed.  She should stop the aspirin and can start Advil 2-3 times a day which will help with inflammation type of pain.  All questions and concerns were answered and addressed.  We will see her back in 4 weeks to see how she is doing overall with a repeat exam.

## 2021-02-12 ENCOUNTER — Encounter: Payer: Self-pay | Admitting: Rehabilitative and Restorative Service Providers"

## 2021-02-12 ENCOUNTER — Ambulatory Visit (INDEPENDENT_AMBULATORY_CARE_PROVIDER_SITE_OTHER): Payer: Federal, State, Local not specified - PPO | Admitting: Rehabilitative and Restorative Service Providers"

## 2021-02-12 ENCOUNTER — Other Ambulatory Visit: Payer: Self-pay

## 2021-02-12 DIAGNOSIS — R600 Localized edema: Secondary | ICD-10-CM | POA: Diagnosis not present

## 2021-02-12 DIAGNOSIS — M25662 Stiffness of left knee, not elsewhere classified: Secondary | ICD-10-CM | POA: Diagnosis not present

## 2021-02-12 DIAGNOSIS — M6281 Muscle weakness (generalized): Secondary | ICD-10-CM

## 2021-02-12 DIAGNOSIS — R262 Difficulty in walking, not elsewhere classified: Secondary | ICD-10-CM | POA: Diagnosis not present

## 2021-02-12 DIAGNOSIS — M25562 Pain in left knee: Secondary | ICD-10-CM

## 2021-02-12 NOTE — Therapy (Addendum)
[facility] [address] [place], [place], [address] Phone: [phone]   Fax:  [phone]  Physical Therapy Evaluation  Patient Details  Name: Cheyenne Graham MRN: [id number] Date of Birth: 1966/03/27 Referring Provider (PT): Mcarthur Rossetti MD   Encounter Date: 02/12/2021     Past Medical History:  Diagnosis Date   Arthritis    Constipation    Family history of anesthesia complication    ?  Aunt did not wake up.  Ms Olesky not sure where she had surgery,l   GERD (gastroesophageal reflux disease)    decreased since she is not taking advail   Herpes simplex    Hypertension    in the history, resolved after diet changes   Migraine     2 a week   Pneumonia        PONV (postoperative nausea and vomiting)     Past Surgical History:  Procedure Laterality Date   Fatty tumor     KNEE ARTHROSCOPY Right    KNEE SURGERY  02/22/2012   KNEE SURGERY Right 06/18/2014   TOTAL KNEE ARTHROPLASTY  02/22/2012   Procedure: TOTAL KNEE ARTHROPLASTY;  Surgeon: Garald Balding, MD;  Location: [place];  Service: Orthopedics;  Laterality: Right;   TOTAL KNEE ARTHROPLASTY Left 01/27/2021   Procedure: LEFT TOTAL KNEE ARTHROPLASTY;  Surgeon: Mcarthur Rossetti, MD;  Location: [place];  Service: Orthopedics;  Laterality: Left;   TOTAL KNEE REVISION Right 06/18/2014   Procedure: RIGHT TOTAL KNEE REVISION;  Surgeon: Garald Balding, MD;  Location: [place];  Service: Orthopedics;  Laterality: Right;   TUBAL LIGATION      There were no vitals filed for this visit.                      Objective measurements completed on examination: See above findings.                   PT Short Term Goals - 03/06/21 1734       PT SHORT TERM GOAL #1   Title Improve L knee AROM to -3 to 90 degrees.    Baseline -2 to 92 degrees (was -5 to 64 degrees at evaluation)    Time 4    Period Weeks    Status Achieved    Target Date 03/12/21                PT Long Term Goals - 04/10/21 1145       PT LONG TERM GOAL #1   Target Date 04/10/21                      Patient will benefit from skilled therapeutic intervention in order to improve the following deficits and impairments:  Abnormal gait, Decreased activity tolerance, Decreased endurance, Decreased range of motion, Decreased strength, Difficulty walking, Increased edema, Impaired flexibility, Pain  Visit Diagnosis: Difficulty walking - Plan: PT plan of care cert/re-cert  Muscle weakness (generalized) - Plan: PT plan of care cert/re-cert  Localized edema - Plan: PT plan of care cert/re-cert  Stiffness of left knee, not elsewhere classified - Plan: PT plan of care cert/re-cert  Acute pain of left knee - Plan: PT plan of care cert/re-cert     Problem List Patient Active Problem List   Diagnosis Date Noted   Status post total left knee replacement 01/27/2021   Unilateral primary osteoarthritis, left knee 11/27/2020   Primary osteoarthritis of both  hands 11/17/2016   Chronic pain of right knee 10/03/2016   History of hypertension 10/03/2016   History of migraine 10/03/2016   Gastroesophageal reflux disease without esophagitis 10/03/2016   History of herpes simplex infection 10/03/2016   History of total knee replacement, right 06/18/2014   Other chronic postoperative pain 01/25/2014   Osteoarthritis of left knee 01/25/2014   Postoperative anemia due to acute blood loss 02/24/2012   Obesity (BMI 30-39.9) 02/24/2012    Farley Ly PT, MPT 04/10/2021, 4:46 PM  [facility] [address] [place], [place], [address] Phone: [phone]   Fax:  [phone]  Name: Cheyenne Graham MRN: [id number] Date of Birth: 01-30-1966

## 2021-02-12 NOTE — Patient Instructions (Signed)
Access Code: [email or web address] URL: [email or web address] Date: 02/12/2021 Prepared by: Vista Mink  Exercises Supine Quadricep Sets - 3-5 x daily - 7 x weekly - 2-3 sets - 10 reps - 5 second hold Seated Knee Flexion AAROM - 3-5 x daily - 7 x weekly - 1 sets - 1 reps - 3 minutes hold

## 2021-02-13 ENCOUNTER — Other Ambulatory Visit: Payer: Self-pay | Admitting: Orthopaedic Surgery

## 2021-02-13 ENCOUNTER — Telehealth: Payer: Self-pay | Admitting: Orthopaedic Surgery

## 2021-02-13 MED ORDER — OXYCODONE HCL 5 MG PO TABS: 5.0000 mg | ORAL_TABLET | Freq: Four times a day (QID) | ORAL | 0 refills | Status: DC | PRN

## 2021-02-13 NOTE — Telephone Encounter (Signed)
Pt called asking for a refill of her oxycodone 5 mg and would like a CB when this has been called in so she can have it for the weekend.    [phone]

## 2021-02-13 NOTE — Telephone Encounter (Signed)
Please advise 

## 2021-02-16 ENCOUNTER — Encounter: Payer: Federal, State, Local not specified - PPO | Admitting: Rehabilitative and Restorative Service Providers"

## 2021-02-17 ENCOUNTER — Encounter: Payer: Federal, State, Local not specified - PPO | Admitting: Rehabilitative and Restorative Service Providers"

## 2021-02-19 ENCOUNTER — Ambulatory Visit (INDEPENDENT_AMBULATORY_CARE_PROVIDER_SITE_OTHER): Payer: Federal, State, Local not specified - PPO | Admitting: Rehabilitative and Restorative Service Providers"

## 2021-02-19 ENCOUNTER — Encounter: Payer: Self-pay | Admitting: Rehabilitative and Restorative Service Providers"

## 2021-02-19 ENCOUNTER — Other Ambulatory Visit: Payer: Self-pay

## 2021-02-19 DIAGNOSIS — R262 Difficulty in walking, not elsewhere classified: Secondary | ICD-10-CM

## 2021-02-19 DIAGNOSIS — M25562 Pain in left knee: Secondary | ICD-10-CM

## 2021-02-19 DIAGNOSIS — R600 Localized edema: Secondary | ICD-10-CM | POA: Diagnosis not present

## 2021-02-19 DIAGNOSIS — M25662 Stiffness of left knee, not elsewhere classified: Secondary | ICD-10-CM

## 2021-02-19 DIAGNOSIS — M6281 Muscle weakness (generalized): Secondary | ICD-10-CM

## 2021-02-19 NOTE — Therapy (Signed)
[facility] [address] [place], [place], [address] Phone: [phone]   Fax:  [phone]  Physical Therapy Treatment  Patient Details  Name: Cheyenne Graham MRN: [id number] Date of Birth: February 25, 1966 Referring Provider (PT): Mcarthur Rossetti MD   Encounter Date: 02/19/2021   PT End of Session - 02/19/21 0837     Visit Number 2    Number of Visits 24    PT Start Time 0802    PT Stop Time 6553    PT Time Calculation (min) 53 min    Activity Tolerance Patient tolerated treatment well;Patient limited by pain    Behavior During Therapy [facility] for tasks assessed/performed             Past Medical History:  Diagnosis Date   Arthritis    Constipation    Family history of anesthesia complication    ?  Aunt did not wake up.  Ms Potteiger not sure where she had surgery,l   GERD (gastroesophageal reflux disease)    decreased since she is not taking advail   Herpes simplex    Hypertension    in the history, resolved after diet changes   Migraine     2 a week   Pneumonia        PONV (postoperative nausea and vomiting)     Past Surgical History:  Procedure Laterality Date   Fatty tumor     KNEE ARTHROSCOPY Right    KNEE SURGERY  02/22/2012   KNEE SURGERY Right 06/18/2014   TOTAL KNEE ARTHROPLASTY  02/22/2012   Procedure: TOTAL KNEE ARTHROPLASTY;  Surgeon: Garald Balding, MD;  Location: [place];  Service: Orthopedics;  Laterality: Right;   TOTAL KNEE ARTHROPLASTY Left 01/27/2021   Procedure: LEFT TOTAL KNEE ARTHROPLASTY;  Surgeon: Mcarthur Rossetti, MD;  Location: [place];  Service: Orthopedics;  Laterality: Left;   TOTAL KNEE REVISION Right 06/18/2014   Procedure: RIGHT TOTAL KNEE REVISION;  Surgeon: Garald Balding, MD;  Location: [place];  Service: Orthopedics;  Laterality: Right;   TUBAL LIGATION      There were no vitals filed for this visit.   Subjective Assessment - 02/19/21 0807     Subjective Cheyenne Graham reports sleeping < an  hour at a time and excellent HEP compliance.    Pertinent History R TKA (X2) and HTN    Limitations Sitting;House hold activities;Standing;Walking    How long can you sit comfortably? 30 minutes    How long can you stand comfortably? 15 minutes    How long can you walk comfortably? 10 minutes    Patient Stated Goals Improve comfort with standing and walking    Currently in Pain? Yes    Pain Score 5     Pain Location Knee    Pain Orientation Left    Pain Descriptors / Indicators Tightness;Aching;Sore    Pain Type Chronic pain;Surgical pain    Pain Radiating Towards NA    Pain Onset 1 to 4 weeks ago    Pain Frequency Constant    Aggravating Factors  Too much WB or prolonged postures    Pain Relieving Factors Exercises, pain medications, ice    Effect of Pain on Daily Activities Uses a cane and limits all WB ADLs    Multiple Pain Sites No                [facility] PT Assessment - 02/19/21 0001       AROM   Left Knee Extension -  4    Left Knee Flexion 70                           [facility] Adult PT Treatment/Exercise - 02/19/21 0001       Exercises   Exercises Knee/Hip      Knee/Hip Exercises: Aerobic   Recumbent Bike Seat 6 for AROM 8 minutes      Knee/Hip Exercises: Machines for Strengthening   Total Gym Leg Press 75# 2 sets of 15 emphasis on full extension AROM and stretch at flexion (bottom) slow eccentrics      Knee/Hip Exercises: Seated   Knee/Hip Flexion Seated knee flexion (R pushes L into flexion) 10X 10 seconds    Other Seated Knee/Hip Exercises Tailgate knee flexion AROM 3 minutes      Knee/Hip Exercises: Supine   Quad Sets Strengthening;Both;3 sets;10 reps;Limitations    Quad Sets Limitations 5 seconds    Other Supine Knee/Hip Exercises Supine knee flexion with belt 10X 10 seconds with quadriceps sets between reps      Modalities   Modalities Vasopneumatic      Vasopneumatic   Number Minutes Vasopneumatic  10 minutes    Vasopnuematic  Location  Knee    Vasopneumatic Pressure Medium    Vasopneumatic Temperature  34*                    PT Education - 02/19/21 8469     Education Details Reviewed HEP with knee flexion with a belt given as optional.  Added 2 new activities in the gym.    Person(s) Educated Patient    Methods Explanation;Demonstration;Tactile cues;Verbal cues    Comprehension Verbal cues required;Returned demonstration;Need further instruction;Tactile cues required;Verbalized understanding              PT Short Term Goals - 02/19/21 6295       PT SHORT TERM GOAL #1   Title Improve L knee AROM to -3 to 90 degrees.    Baseline -4 to 70 degrees (was -5 to 64 degrees at evaluation)    Time 4    Period Weeks    Status On-going    Target Date 03/12/21               PT Long Term Goals - 02/19/21 0837       PT LONG TERM GOAL #1   Title Improve FOTO to 43.    Baseline 15    Time 8    Period Weeks    Status On-going      PT LONG TERM GOAL #2   Title Improve L knee AROM to 0-105 degrees.    Baseline -5 to 64 degrees    Time 8    Period Weeks    Status On-going      PT LONG TERM GOAL #3   Title Improve B quadriceps strength as assessed by MMT and functional scores.    Time 8    Period Weeks    Status On-going      PT LONG TERM GOAL #4   Title Cheyenne Graham will be independent with her long-term HEP at DC.    Time 8    Period Weeks    Status On-going                   Plan - 02/19/21 0838     Clinical Impression Statement Cheyenne Graham is s/p L TKA 01/27/2021.  Early HEP compliance is  excellent with 100 quadriceps/day along with 3-5X/day flexion AROM activities.  Objectively, she is better today (-4 to 70 degrees, was -5 to 64 degrees).  Extension AROM is moving nicely while edema and flexion AROM are more limiting.  Continue POC with emphasis on extension strength, flexion AROM and edema control.    Personal Factors and Comorbidities Comorbidity 2    Comorbidities R TKA (X2)  and HTN    Examination-Activity Limitations Bathing;Dressing;Transfers;Bed Mobility;Sleep;Squat;Lift;Bend;Locomotion Level;Stairs;Stand;Carry    Examination-Participation Restrictions Interpersonal Relationship;Occupation;Yard Work;Laundry;Cleaning;Community Activity;Driving    Stability/Clinical Decision Making Stable/Uncomplicated    Rehab Potential Good    PT Frequency 3x / week    PT Duration 8 weeks    PT Treatment/Interventions ADLs/Self Care Home Management;Electrical Stimulation;Cryotherapy;Therapeutic activities;Stair training;Gait training;Therapeutic exercise;Balance training;Neuromuscular re-education;Patient/family education;Manual techniques;Passive range of motion;Vasopneumatic Device    PT Next Visit Plan Leg press, bike, flexion AROM and quadriceps strength work    PT Home Exercise Plan Access Code: [email or web address]    Consulted and Agree with Plan of Care Patient             Patient will benefit from skilled therapeutic intervention in order to improve the following deficits and impairments:  Abnormal gait, Decreased activity tolerance, Decreased endurance, Decreased range of motion, Decreased strength, Difficulty walking, Increased edema, Impaired flexibility, Pain  Visit Diagnosis: Difficulty walking  Muscle weakness (generalized)  Localized edema  Stiffness of left knee, not elsewhere classified  Acute pain of left knee     Problem List Patient Active Problem List   Diagnosis Date Noted   Status post total left knee replacement 01/27/2021   Unilateral primary osteoarthritis, left knee 11/27/2020   Primary osteoarthritis of both hands 11/17/2016   Chronic pain of right knee 10/03/2016   History of hypertension 10/03/2016   History of migraine 10/03/2016   Gastroesophageal reflux disease without esophagitis 10/03/2016   History of herpes simplex infection 10/03/2016   History of total knee replacement, right 06/18/2014   Other chronic postoperative pain  01/25/2014   Osteoarthritis of left knee 01/25/2014   Postoperative anemia due to acute blood loss 02/24/2012   Obesity (BMI 30-39.9) 02/24/2012    Farley Ly PT, MPT 02/19/2021, 8:43 AM  [facility] [address] [place], [place], [address] Phone: [phone]   Fax:  [phone]  Name: Cheyenne Graham MRN: [id number] Date of Birth: 08-05-1966

## 2021-02-23 ENCOUNTER — Other Ambulatory Visit: Payer: Self-pay | Admitting: Orthopaedic Surgery

## 2021-02-23 ENCOUNTER — Telehealth: Payer: Self-pay

## 2021-02-23 MED ORDER — METHOCARBAMOL 500 MG PO TABS: 500.0000 mg | ORAL_TABLET | Freq: Four times a day (QID) | ORAL | 1 refills | Status: DC | PRN

## 2021-02-23 MED ORDER — OXYCODONE HCL 5 MG PO TABS: 5.0000 mg | ORAL_TABLET | Freq: Four times a day (QID) | ORAL | 0 refills | Status: DC | PRN

## 2021-02-23 NOTE — Telephone Encounter (Signed)
Patient called she is requesting a rx refill for oxycodone and methocarbamol, patient is also requesting a out of work note for her job to be emailed to her call back:[phone] email:[email or web address]

## 2021-02-24 ENCOUNTER — Other Ambulatory Visit: Payer: Self-pay

## 2021-02-24 ENCOUNTER — Encounter: Payer: Self-pay | Admitting: Rehabilitative and Restorative Service Providers"

## 2021-02-24 ENCOUNTER — Ambulatory Visit (INDEPENDENT_AMBULATORY_CARE_PROVIDER_SITE_OTHER): Payer: Federal, State, Local not specified - PPO | Admitting: Rehabilitative and Restorative Service Providers"

## 2021-02-24 DIAGNOSIS — M6281 Muscle weakness (generalized): Secondary | ICD-10-CM

## 2021-02-24 DIAGNOSIS — R600 Localized edema: Secondary | ICD-10-CM | POA: Diagnosis not present

## 2021-02-24 DIAGNOSIS — R262 Difficulty in walking, not elsewhere classified: Secondary | ICD-10-CM | POA: Diagnosis not present

## 2021-02-24 DIAGNOSIS — M25662 Stiffness of left knee, not elsewhere classified: Secondary | ICD-10-CM | POA: Diagnosis not present

## 2021-02-24 DIAGNOSIS — M25562 Pain in left knee: Secondary | ICD-10-CM

## 2021-02-24 NOTE — Therapy (Signed)
[facility] [address] [place], [place], [address] Phone: [phone]   Fax:  [phone]  Physical Therapy Treatment  Patient Details  Name: Cheyenne Graham MRN: [id number] Date of Birth: 10/03/1965 Referring Provider (PT): Mcarthur Rossetti MD   Encounter Date: 02/24/2021   PT End of Session - 02/24/21 0859     Visit Number 3    Number of Visits 24    PT Start Time 0846    PT Stop Time 0936    PT Time Calculation (min) 50 min    Activity Tolerance Patient tolerated treatment well    Behavior During Therapy [facility] for tasks assessed/performed             Past Medical History:  Diagnosis Date   Arthritis    Constipation    Family history of anesthesia complication    ?  Aunt did not wake up.  [name] not sure where she had surgery,l   GERD (gastroesophageal reflux disease)    decreased since she is not taking advail   Herpes simplex    Hypertension    in the history, resolved after diet changes   Migraine     2 a week   Pneumonia        PONV (postoperative nausea and vomiting)     Past Surgical History:  Procedure Laterality Date   Fatty tumor     KNEE ARTHROSCOPY Right    KNEE SURGERY  02/22/2012   KNEE SURGERY Right 06/18/2014   TOTAL KNEE ARTHROPLASTY  02/22/2012   Procedure: TOTAL KNEE ARTHROPLASTY;  Surgeon: Garald Balding, MD;  Location: [place];  Service: Orthopedics;  Laterality: Right;   TOTAL KNEE ARTHROPLASTY Left 01/27/2021   Procedure: LEFT TOTAL KNEE ARTHROPLASTY;  Surgeon: Mcarthur Rossetti, MD;  Location: [place];  Service: Orthopedics;  Laterality: Left;   TOTAL KNEE REVISION Right 06/18/2014   Procedure: RIGHT TOTAL KNEE REVISION;  Surgeon: Garald Balding, MD;  Location: [place];  Service: Orthopedics;  Laterality: Right;   TUBAL LIGATION      There were no vitals filed for this visit.   Subjective Assessment - 02/24/21 0850     Subjective Pt. indicated stiffness around 5/10 or so.   Sleeping still difficult.  Pt. indicated driving today and moving a bit better.    Pertinent History R TKA (X2) and HTN    Limitations Sitting;House hold activities;Standing;Walking    How long can you sit comfortably? 30 minutes    How long can you stand comfortably? 15 minutes    How long can you walk comfortably? 10 minutes    Patient Stated Goals Improve comfort with standing and walking    Currently in Pain? Yes    Pain Score 5     Pain Location Knee    Pain Orientation Left    Pain Descriptors / Indicators Tightness    Pain Type Chronic pain;Surgical pain    Pain Onset 1 to 4 weeks ago    Pain Frequency Intermittent    Aggravating Factors  stiffness variable during day.    Pain Relieving Factors exercise helps.                               [facility] Adult PT Treatment/Exercise - 02/24/21 0001       Knee/Hip Exercises: Aerobic   Nustep Lvl 5 6 mins   Performed due to difficulty c Rt knee flexion  Knee/Hip Exercises: Machines for Strengthening   Total Gym Leg Press 75# 3 x 10 emphasis on full extension AROM and stretch at flexion (bottom) slow eccentrics      Knee/Hip Exercises: Seated   Long Arc Quad Left;2 sets;10 reps   LAQ to knee flexion stretch 2-3 second hold (contraleral leg movement)   Knee/Hip Flexion seated knee flexion c Rt leg overpressure 10 sec x 5    Sit to Sand 10 reps;without UE support   21.5 inch table     Knee/Hip Exercises: Supine   Quad Sets Limitations Reviewed quad set for home      Vasopneumatic   Number Minutes Vasopneumatic  10 minutes    Vasopnuematic Location  Knee    Vasopneumatic Pressure Medium    Vasopneumatic Temperature  34*      Manual Therapy   Manual therapy comments seated Lt knee distraction c flexion, IR mobilization c movement c contralateral leg movement opposite.                      PT Short Term Goals - 02/19/21 5003       PT SHORT TERM GOAL #1   Title Improve L knee AROM to -3 to 90  degrees.    Baseline -4 to 70 degrees (was -5 to 64 degrees at evaluation)    Time 4    Period Weeks    Status On-going    Target Date 03/12/21               PT Long Term Goals - 02/19/21 0837       PT LONG TERM GOAL #1   Title Improve FOTO to 43.    Baseline 15    Time 8    Period Weeks    Status On-going      PT LONG TERM GOAL #2   Title Improve L knee AROM to 0-105 degrees.    Baseline -5 to 64 degrees    Time 8    Period Weeks    Status On-going      PT LONG TERM GOAL #3   Title Improve B quadriceps strength as assessed by MMT and functional scores.    Time 8    Period Weeks    Status On-going      PT LONG TERM GOAL #4   Title [name] will be independent with her long-term HEP at [place].    Time 8    Period Weeks    Status On-going                   Plan - 02/24/21 0916     Clinical Impression Statement Favorable response for improved Lt knee flexion during course of manual intervention in sitting as well as incorporation of contralteral leg movement opposite in manual and ther ex intervention LAQ to facilitate improved symptoms/mobility.  DIscussed incision mobility cross friction massage techniques for home use.  Pt. to beneift from skilled PT services continued at this time.    Personal Factors and Comorbidities Comorbidity 2    Comorbidities R TKA (X2) and HTN    Examination-Activity Limitations Bathing;Dressing;Transfers;Bed Mobility;Sleep;Squat;Lift;Bend;Locomotion Level;Stairs;Stand;Carry    Examination-Participation Restrictions Interpersonal Relationship;Occupation;Yard Work;Laundry;Cleaning;Community Activity;Driving    Stability/Clinical Decision Making Stable/Uncomplicated    Rehab Potential Good    PT Frequency 3x / week    PT Duration 8 weeks    PT Treatment/Interventions ADLs/Self Care Home Management;Electrical Stimulation;Cryotherapy;Therapeutic activities;Stair training;Gait training;Therapeutic exercise;Balance training;Neuromuscular  re-education;Patient/family education;Manual techniques;Passive range  of motion;Vasopneumatic Device    PT Next Visit Plan Continued strengthening plan in [place], [facility].  Pt. to most likely benefit from manual intervention to facilitate flexion gains as noted today.    PT Home Exercise Plan Access Code: [email or web address]    Consulted and Agree with Plan of Care Patient             Patient will benefit from skilled therapeutic intervention in order to improve the following deficits and impairments:  Abnormal gait, Decreased activity tolerance, Decreased endurance, Decreased range of motion, Decreased strength, Difficulty walking, Increased edema, Impaired flexibility, Pain  Visit Diagnosis: Difficulty walking  Muscle weakness (generalized)  Localized edema  Stiffness of left knee, not elsewhere classified  Acute pain of left knee     Problem List Patient Active Problem List   Diagnosis Date Noted   Status post total left knee replacement 01/27/2021   Unilateral primary osteoarthritis, left knee 11/27/2020   Primary osteoarthritis of both hands 11/17/2016   Chronic pain of right knee 10/03/2016   History of hypertension 10/03/2016   History of migraine 10/03/2016   Gastroesophageal reflux disease without esophagitis 10/03/2016   History of herpes simplex infection 10/03/2016   History of total knee replacement, right 06/18/2014   Other chronic postoperative pain 01/25/2014   Osteoarthritis of left knee 01/25/2014   Postoperative anemia due to acute blood loss 02/24/2012   Obesity (BMI 30-39.9) 02/24/2012    Scot Jun, PT, DPT, OCS, ATC 02/24/21  9:27 AM    [facility] [address] [place], [place], [address] Phone: [phone]   Fax:  [phone]  Name: Cheyenne Graham MRN: [id number] Date of Birth: 1966/05/27

## 2021-02-26 ENCOUNTER — Ambulatory Visit (INDEPENDENT_AMBULATORY_CARE_PROVIDER_SITE_OTHER): Payer: Federal, State, Local not specified - PPO | Admitting: Rehabilitative and Restorative Service Providers"

## 2021-02-26 ENCOUNTER — Encounter: Payer: Self-pay | Admitting: Rehabilitative and Restorative Service Providers"

## 2021-02-26 ENCOUNTER — Other Ambulatory Visit: Payer: Self-pay

## 2021-02-26 DIAGNOSIS — R262 Difficulty in walking, not elsewhere classified: Secondary | ICD-10-CM | POA: Diagnosis not present

## 2021-02-26 DIAGNOSIS — M25662 Stiffness of left knee, not elsewhere classified: Secondary | ICD-10-CM | POA: Diagnosis not present

## 2021-02-26 DIAGNOSIS — M6281 Muscle weakness (generalized): Secondary | ICD-10-CM

## 2021-02-26 DIAGNOSIS — R600 Localized edema: Secondary | ICD-10-CM

## 2021-02-26 DIAGNOSIS — M25562 Pain in left knee: Secondary | ICD-10-CM

## 2021-02-26 NOTE — Patient Instructions (Signed)
Reinforced current HEP.

## 2021-02-26 NOTE — Therapy (Signed)
[facility] [address] [place], [place], [address] Phone: [phone]   Fax:  [phone]  Physical Therapy Treatment  Patient Details  Name: Cheyenne Graham MRN: [id number] Date of Birth: [birth year] Referring Provider (PT): Mcarthur Rossetti MD   Encounter Date: 02/26/2021   PT End of Session - 02/26/21 1053     Visit Number 4    Number of Visits 24    PT Start Time 6644    PT Stop Time 1100    PT Time Calculation (min) 45 min    Activity Tolerance Patient tolerated treatment well;Patient limited by pain    Behavior During Therapy [facility] for tasks assessed/performed             Past Medical History:  Diagnosis Date   Arthritis    Constipation    Family history of anesthesia complication    ?  Aunt did not wake up.  Cheyenne Graham not sure where she had surgery,l   GERD (gastroesophageal reflux disease)    decreased since she is not taking advail   Herpes simplex    Hypertension    in the history, resolved after diet changes   Migraine     2 a week   Pneumonia        PONV (postoperative nausea and vomiting)     Past Surgical History:  Procedure Laterality Date   Fatty tumor     KNEE ARTHROSCOPY Right    KNEE SURGERY  02/22/2012   KNEE SURGERY Right 06/18/2014   TOTAL KNEE ARTHROPLASTY  02/22/2012   Procedure: TOTAL KNEE ARTHROPLASTY;  Surgeon: Garald Balding, MD;  Location: [place];  Service: Orthopedics;  Laterality: Right;   TOTAL KNEE ARTHROPLASTY Left 01/27/2021   Procedure: LEFT TOTAL KNEE ARTHROPLASTY;  Surgeon: Mcarthur Rossetti, MD;  Location: [place];  Service: Orthopedics;  Laterality: Left;   TOTAL KNEE REVISION Right 06/18/2014   Procedure: RIGHT TOTAL KNEE REVISION;  Surgeon: Garald Balding, MD;  Location: [place];  Service: Orthopedics;  Laterality: Right;   TUBAL LIGATION      There were no vitals filed for this visit.   Subjective Assessment - 02/26/21 1022     Subjective Cheyenne Graham reports good HEP  compliance.  Sleep is interrupted and inconsistent.    Pertinent History R TKA (X2) and HTN    Limitations Sitting;House hold activities;Standing;Walking    How long can you sit comfortably? 45 minutes (was 30 minutes)    How long can you stand comfortably? 45 minutes (was 15 minutes)    How long can you walk comfortably? 15 minutes (was 10 minutes)    Patient Stated Goals Improve comfort with standing and walking    Currently in Pain? Yes    Pain Score 6     Pain Location Knee    Pain Orientation Left    Pain Descriptors / Indicators Tightness    Pain Type Chronic pain;Surgical pain    Pain Radiating Towards NA    Pain Onset 1 to 4 weeks ago    Pain Frequency Constant    Aggravating Factors  Sleeping, prolonged postures    Pain Relieving Factors Change of position, ice and exercises    Effect of Pain on Daily Activities Cane most of the time.  A bit slow and inefficient with movement.    Multiple Pain Sites No                [facility] PT Assessment - 02/26/21 0001  ROM / Strength   AROM / PROM / Strength AROM      AROM   Overall AROM  Deficits    AROM Assessment Site Knee    Right/Left Knee Left;Right    Right Knee Extension 0    Right Knee Flexion 84    Left Knee Extension -4    Left Knee Flexion 80                           [facility] Adult PT Treatment/Exercise - 02/26/21 0001       Therapeutic Activites    Therapeutic Activities ADL's    ADL's Step-up and over 4 and 6 inch step slow eccentrics      Neuro Re-ed    Neuro Re-ed Details  Tandem balance eyes open/head turning/eyes closed 2X 20 seconds each      Exercises   Exercises Knee/Hip      Knee/Hip Exercises: Aerobic   Recumbent Bike Seat 6 for AROM 8 minutes      Knee/Hip Exercises: Machines for Strengthening   Total Gym Leg Press 81# 2 sets of 15 emphasis on full extension AROM and stretch at flexion (bottom) slow eccentrics      Knee/Hip Exercises: Seated   Sit to Sand 5  reps;without UE support   slow eccentrics                   PT Education - 02/26/21 1052     Education Details Progressed balance/proprioception, stairs/steps and reinforced HEP.  Progressed weights with leg press and added sit to stand with slow eccentrics.    Person(s) Educated Patient    Methods Explanation;Demonstration;Tactile cues;Verbal cues    Comprehension Verbalized understanding;Tactile cues required;Need further instruction;Returned demonstration;Verbal cues required              PT Short Term Goals - 02/26/21 1059       PT SHORT TERM GOAL #1   Title Improve L knee AROM to -3 to 90 degrees.    Baseline -4 to 80 degrees (was -5 to 64 degrees at evaluation)    Time 4    Period Weeks    Status On-going    Target Date 03/12/21               PT Long Term Goals - 02/26/21 1059       PT LONG TERM GOAL #1   Title Improve FOTO to 43.    Baseline 15    Time 8    Period Weeks    Status On-going      PT LONG TERM GOAL #2   Title Improve L knee AROM to 0-105 degrees.    Baseline -4 to 80 degrees    Time 8    Period Weeks    Status On-going      PT LONG TERM GOAL #3   Title Improve B quadriceps strength as assessed by MMT and functional scores.    Time 8    Period Weeks    Status On-going      PT LONG TERM GOAL #4   Title Cheyenne Graham will be independent with her long-term HEP at DC.    Time 8    Period Weeks    Status On-going                   Plan - 02/26/21 1100     Clinical Impression Statement Cheyenne Graham is making objective progress with  her L knee AROM.  -4 to 80 degrees objectively assessed today.  Her HEP is focused on quadriceps sets (knee extension AROM and strength) along with 2 knee flexion AROM activities (tailgate knee flexion and knee flexion with self-overpressure).  Focus remains AROM, quadriceps strength and self-reported function.  Progressed stairs and balance today with good results.    Personal Factors and Comorbidities  Comorbidity 2    Comorbidities R TKA (X2) and HTN    Examination-Activity Limitations Bathing;Dressing;Transfers;Bed Mobility;Sleep;Squat;Lift;Bend;Locomotion Level;Stairs;Stand;Carry    Examination-Participation Restrictions Interpersonal Relationship;Occupation;Yard Work;Laundry;Cleaning;Community Activity;Driving    Stability/Clinical Decision Making Stable/Uncomplicated    Rehab Potential Good    PT Frequency 3x / week    PT Duration 8 weeks    PT Treatment/Interventions ADLs/Self Care Home Management;Electrical Stimulation;Cryotherapy;Therapeutic activities;Stair training;Gait training;Therapeutic exercise;Balance training;Neuromuscular re-education;Patient/family education;Manual techniques;Passive range of motion;Vasopneumatic Device    PT Next Visit Plan AROM, quadriceps strength, stair progression.  MD expectations of 90 degrees flexion.    PT Home Exercise Plan Access Code: [email or web address]    Consulted and Agree with Plan of Care Patient             Patient will benefit from skilled therapeutic intervention in order to improve the following deficits and impairments:  Abnormal gait, Decreased activity tolerance, Decreased endurance, Decreased range of motion, Decreased strength, Difficulty walking, Increased edema, Impaired flexibility, Pain  Visit Diagnosis: Difficulty walking  Muscle weakness (generalized)  Stiffness of left knee, not elsewhere classified  Localized edema  Acute pain of left knee     Problem List Patient Active Problem List   Diagnosis Date Noted   Status post total left knee replacement 01/27/2021   Unilateral primary osteoarthritis, left knee 11/27/2020   Primary osteoarthritis of both hands 11/17/2016   Chronic pain of right knee 10/03/2016   History of hypertension 10/03/2016   History of migraine 10/03/2016   Gastroesophageal reflux disease without esophagitis 10/03/2016   History of herpes simplex infection 10/03/2016   History of total knee  replacement, right 06/18/2014   Other chronic postoperative pain 01/25/2014   Osteoarthritis of left knee 01/25/2014   Postoperative anemia due to acute blood loss 02/24/2012   Obesity (BMI 30-39.9) 02/24/2012    Farley Ly PT, MPT 02/26/2021, 1:53 PM  [facility] [address] [place], [place], [address] Phone: [phone]   Fax:  [phone]  Name: Cheyenne Graham MRN: [id number] Date of Birth: August 26, 1966

## 2021-02-27 ENCOUNTER — Ambulatory Visit (INDEPENDENT_AMBULATORY_CARE_PROVIDER_SITE_OTHER): Payer: Federal, State, Local not specified - PPO | Admitting: Rehabilitative and Restorative Service Providers"

## 2021-02-27 ENCOUNTER — Encounter: Payer: Self-pay | Admitting: Rehabilitative and Restorative Service Providers"

## 2021-02-27 DIAGNOSIS — M6281 Muscle weakness (generalized): Secondary | ICD-10-CM | POA: Diagnosis not present

## 2021-02-27 DIAGNOSIS — R600 Localized edema: Secondary | ICD-10-CM | POA: Diagnosis not present

## 2021-02-27 DIAGNOSIS — M25662 Stiffness of left knee, not elsewhere classified: Secondary | ICD-10-CM | POA: Diagnosis not present

## 2021-02-27 DIAGNOSIS — R262 Difficulty in walking, not elsewhere classified: Secondary | ICD-10-CM

## 2021-02-27 DIAGNOSIS — M25562 Pain in left knee: Secondary | ICD-10-CM

## 2021-02-27 NOTE — Therapy (Signed)
[facility] Physical Therapy [address] [place], [place], [address] Phone: [phone]   Fax:  [phone]  Physical Therapy Treatment  Patient Details  Name: Cheyenne Graham MRN: [id number] Date of Birth: 1966/02/23 Referring Provider (PT): Mcarthur Rossetti MD   Encounter Date: 02/27/2021   PT End of Session - 02/27/21 0959     Visit Number 5    Number of Visits 24    PT Start Time 0934    PT Stop Time 1024    PT Time Calculation (min) 50 min    Activity Tolerance Patient tolerated treatment well;Patient limited by pain    Behavior During Therapy [facility] for tasks assessed/performed             Past Medical History:  Diagnosis Date   Arthritis    Constipation    Family history of anesthesia complication    ?  Aunt did not wake up.  Cheyenne Graham not sure where she had surgery,l   GERD (gastroesophageal reflux disease)    decreased since she is not taking advail   Herpes simplex    Hypertension    in the history, resolved after diet changes   Migraine     2 a week   Pneumonia        PONV (postoperative nausea and vomiting)     Past Surgical History:  Procedure Laterality Date   Fatty tumor     KNEE ARTHROSCOPY Right    KNEE SURGERY  02/22/2012   KNEE SURGERY Right 06/18/2014   TOTAL KNEE ARTHROPLASTY  02/22/2012   Procedure: TOTAL KNEE ARTHROPLASTY;  Surgeon: Garald Balding, MD;  Location: [place];  Service: Orthopedics;  Laterality: Right;   TOTAL KNEE ARTHROPLASTY Left 01/27/2021   Procedure: LEFT TOTAL KNEE ARTHROPLASTY;  Surgeon: Mcarthur Rossetti, MD;  Location: [place];  Service: Orthopedics;  Laterality: Left;   TOTAL KNEE REVISION Right 06/18/2014   Procedure: RIGHT TOTAL KNEE REVISION;  Surgeon: Garald Balding, MD;  Location: [place];  Service: Orthopedics;  Laterality: Right;   TUBAL LIGATION      There were no vitals filed for this visit.   Subjective Assessment - 02/27/21 0951     Subjective Cheyenne Graham reports feeling good  about her progress thus far.  Sleep is still disturbed.    Pertinent History R TKA (X2) and HTN    Limitations Sitting;House hold activities;Standing;Walking    How long can you sit comfortably? 45 minutes (was 30 minutes)    How long can you stand comfortably? 45 minutes (was 15 minutes)    How long can you walk comfortably? 15 minutes (was 10 minutes)    Patient Stated Goals Improve comfort with standing and walking    Currently in Pain? Yes    Pain Score 5     Pain Location Knee    Pain Orientation Left    Pain Descriptors / Indicators Tightness    Pain Type Chronic pain;Surgical pain    Pain Onset More than a month ago    Pain Frequency Constant    Aggravating Factors  Sleeping, prolonged postures    Pain Relieving Factors Change of position, ice, movement, exercises    Effect of Pain on Daily Activities Needs the cane most of the time.    Multiple Pain Sites No                               [facility] Adult  PT Treatment/Exercise - 02/27/21 0001       Therapeutic Activites    Therapeutic Activities ADL's    ADL's Step-up and over 6 inch step with slow eccentrics and emphasis on knee flexion and control      Neuro Re-ed    Neuro Re-ed Details  Dynamic tandem balance on floor and foam forward and back      Exercises   Exercises Knee/Hip      Knee/Hip Exercises: Machines for Strengthening   Total Gym Leg Press 81# 2 sets of 15 B and 1 set of 10 unilateral (both knees) at 37# emphasis on full extension AROM and stretch at flexion (bottom) slow eccentrics      Knee/Hip Exercises: Seated   Sit to Sand --   slow eccentrics     Knee/Hip Exercises: Supine   Other Supine Knee/Hip Exercises Supine knee flexion with belt 10X 10 seconds with quadriceps sets between reps      Vasopneumatic   Number Minutes Vasopneumatic  10 minutes    Vasopnuematic Location  Knee    Vasopneumatic Pressure Medium    Vasopneumatic Temperature  34*                    PT  Education - 02/27/21 0955     Education Details Continued stair and balance/proprioception progressions.  Focused on more single leg and flexion activities on the leg press.  Reinforced HEP.    Person(s) Educated Patient    Methods Explanation;Demonstration;Tactile cues;Verbal cues    Comprehension Verbal cues required;Returned demonstration;Need further instruction;Tactile cues required;Verbalized understanding              PT Short Term Goals - 02/27/21 0957       PT SHORT TERM GOAL #1   Title Improve L knee AROM to -3 to 90 degrees.    Baseline -4 to 80 degrees (was -5 to 64 degrees at evaluation)    Time 4    Period Weeks    Status On-going    Target Date 03/12/21               PT Long Term Goals - 02/27/21 0957       PT LONG TERM GOAL #1   Title Improve FOTO to 43.    Baseline 15    Time 8    Period Weeks    Status On-going      PT LONG TERM GOAL #2   Title Improve L knee AROM to 0-105 degrees.    Baseline -4 to 80 degrees    Time 8    Period Weeks    Status On-going      PT LONG TERM GOAL #3   Title Improve B quadriceps strength as assessed by MMT and functional scores.    Time 8    Period Weeks    Status On-going      PT LONG TERM GOAL #4   Title Cheyenne Graham will be independent with her long-term HEP at DC.    Time 8    Period Weeks    Status On-going                   Plan - 02/27/21 1014     Clinical Impression Statement Cheyenne Graham continues to give excellent effort with her supervised PT.  HEP compliance remains good.  Progressed balance and steps today along with single leg strength and flexion on the leg press.  Continue quadriceps strength and knee flexion AROM  emphasis.    Personal Factors and Comorbidities Comorbidity 2    Comorbidities R TKA (X2) and HTN    Examination-Activity Limitations Bathing;Dressing;Transfers;Bed Mobility;Sleep;Squat;Lift;Bend;Locomotion Level;Stairs;Stand;Carry    Examination-Participation Restrictions  Interpersonal Relationship;Occupation;Yard Work;Laundry;Cleaning;Community Activity;Driving    Stability/Clinical Decision Making Stable/Uncomplicated    Rehab Potential Good    PT Frequency 3x / week    PT Duration 8 weeks    PT Treatment/Interventions ADLs/Self Care Home Management;Electrical Stimulation;Cryotherapy;Therapeutic activities;Stair training;Gait training;Therapeutic exercise;Balance training;Neuromuscular re-education;Patient/family education;Manual techniques;Passive range of motion;Vasopneumatic Device    PT Next Visit Plan AROM, quadriceps strength, stair progression.  MD expectations of 90 degrees flexion.    PT Home Exercise Plan Access Code: [email or web address]    Consulted and Agree with Plan of Care Patient             Patient will benefit from skilled therapeutic intervention in order to improve the following deficits and impairments:  Abnormal gait, Decreased activity tolerance, Decreased endurance, Decreased range of motion, Decreased strength, Difficulty walking, Increased edema, Impaired flexibility, Pain  Visit Diagnosis: Difficulty walking  Muscle weakness (generalized)  Stiffness of left knee, not elsewhere classified  Localized edema  Acute pain of left knee     Problem List Patient Active Problem List   Diagnosis Date Noted   Status post total left knee replacement 01/27/2021   Unilateral primary osteoarthritis, left knee 11/27/2020   Primary osteoarthritis of both hands 11/17/2016   Chronic pain of right knee 10/03/2016   History of hypertension 10/03/2016   History of migraine 10/03/2016   Gastroesophageal reflux disease without esophagitis 10/03/2016   History of herpes simplex infection 10/03/2016   History of total knee replacement, right 06/18/2014   Other chronic postoperative pain 01/25/2014   Osteoarthritis of left knee 01/25/2014   Postoperative anemia due to acute blood loss 02/24/2012   Obesity (BMI 30-39.9) 02/24/2012    Farley Ly PT, MPT 02/27/2021, 10:16 AM  [facility] Physical Therapy [address] [place], [place], [address] Phone: [phone]   Fax:  [phone]  Name: Cheyenne Graham MRN: [id number] Date of Birth: October 17, 1965

## 2021-02-27 NOTE — Patient Instructions (Signed)
Continue current HEP focused on AROM and quadriceps strength.

## 2021-03-02 ENCOUNTER — Ambulatory Visit (INDEPENDENT_AMBULATORY_CARE_PROVIDER_SITE_OTHER): Payer: Federal, State, Local not specified - PPO | Admitting: Rehabilitative and Restorative Service Providers"

## 2021-03-02 ENCOUNTER — Encounter: Payer: Self-pay | Admitting: Rehabilitative and Restorative Service Providers"

## 2021-03-02 ENCOUNTER — Other Ambulatory Visit: Payer: Self-pay

## 2021-03-02 DIAGNOSIS — M6281 Muscle weakness (generalized): Secondary | ICD-10-CM | POA: Diagnosis not present

## 2021-03-02 DIAGNOSIS — M25562 Pain in left knee: Secondary | ICD-10-CM

## 2021-03-02 DIAGNOSIS — R600 Localized edema: Secondary | ICD-10-CM

## 2021-03-02 DIAGNOSIS — M25662 Stiffness of left knee, not elsewhere classified: Secondary | ICD-10-CM | POA: Diagnosis not present

## 2021-03-02 DIAGNOSIS — R262 Difficulty in walking, not elsewhere classified: Secondary | ICD-10-CM

## 2021-03-02 NOTE — Therapy (Signed)
[facility] Therapy [address] [place], [place], [address] Phone: [phone]   Fax:  [phone]  Physical Therapy Treatment  Patient Details  Name: Cheyenne Graham MRN: [id number] Date of Birth: 08/07/1966 Referring Provider (PT): Mcarthur Rossetti MD   Encounter Date: 03/02/2021   PT End of Session - 03/02/21 0826     Visit Number 6    Number of Visits 24    Progress Note Due on Visit 10    PT Start Time 0812    PT Stop Time 0853    PT Time Calculation (min) 41 min    Activity Tolerance Patient tolerated treatment well    Behavior During Therapy [facility] for tasks assessed/performed             Past Medical History:  Diagnosis Date   Arthritis    Constipation    Family history of anesthesia complication    ?  Aunt did not wake up.  [name] not sure where she had surgery,l   GERD (gastroesophageal reflux disease)    decreased since she is not taking advail   Herpes simplex    Hypertension    in the history, resolved after diet changes   Migraine     2 a week   Pneumonia        PONV (postoperative nausea and vomiting)     Past Surgical History:  Procedure Laterality Date   Fatty tumor     KNEE ARTHROSCOPY Right    KNEE SURGERY  02/22/2012   KNEE SURGERY Right 06/18/2014   TOTAL KNEE ARTHROPLASTY  02/22/2012   Procedure: TOTAL KNEE ARTHROPLASTY;  Surgeon: Garald Balding, MD;  Location: [place];  Service: Orthopedics;  Laterality: Right;   TOTAL KNEE ARTHROPLASTY Left 01/27/2021   Procedure: LEFT TOTAL KNEE ARTHROPLASTY;  Surgeon: Mcarthur Rossetti, MD;  Location: [place];  Service: Orthopedics;  Laterality: Left;   TOTAL KNEE REVISION Right 06/18/2014   Procedure: RIGHT TOTAL KNEE REVISION;  Surgeon: Garald Balding, MD;  Location: [place];  Service: Orthopedics;  Laterality: Right;   TUBAL LIGATION      There were no vitals filed for this visit.   Subjective Assessment - 03/02/21 0817     Subjective Pt. indicated  yesterday was troublesome for pain (walking alot).  Pt. stated nothing a rest today just tightness and left over sore from yesterday.    Pertinent History R TKA (X2) and HTN    Limitations Sitting;House hold activities;Standing;Walking    Patient Stated Goals Improve comfort with standing and walking    Currently in Pain? Yes    Pain Score 5     Pain Location Knee    Pain Orientation Left    Pain Descriptors / Indicators Other (Comment)   irritation   Pain Type Chronic pain;Surgical pain    Pain Onset More than a month ago    Pain Frequency Intermittent    Aggravating Factors  walking proonged    Pain Relieving Factors rest                               [facility] Adult PT Treatment/Exercise - 03/02/21 0001       Neuro Re-ed    Neuro Re-ed Details  fitter rocker board fwd/back 30x each way, tandem ambulation on foam 8 ft x 6 fwd/back each way in bars,      Knee/Hip Exercises: Aerobic   Recumbent Bike  Seat 6 AROM partial revolutions to full revolutions 4 mins (limited mainly due to Rt knee)      Knee/Hip Exercises: Standing   Forward Step Up Step Height: 6";2 sets;10 reps;Left      Knee/Hip Exercises: Seated   Long Arc Quad Left;3 sets;10 reps   1-2 sec hold in extension and flexion(Rt leg moving opposite)   Long Arc Quad Weight 5 lbs.      Vasopneumatic   Number Minutes Vasopneumatic  10 minutes    Vasopnuematic Location  Knee    Vasopneumatic Pressure Medium    Vasopneumatic Temperature  34      Manual Therapy   Manual therapy comments seated Lt knee distraction c flexion, IR mobilization c movement c contralateral leg movement opposite.                      PT Short Term Goals - 02/27/21 0957       PT SHORT TERM GOAL #1   Title Improve L knee AROM to -3 to 90 degrees.    Baseline -4 to 80 degrees (was -5 to 64 degrees at evaluation)    Time 4    Period Weeks    Status On-going    Target Date 03/12/21               PT Long Term  Goals - 02/27/21 0957       PT LONG TERM GOAL #1   Title Improve FOTO to 43.    Baseline 15    Time 8    Period Weeks    Status On-going      PT LONG TERM GOAL #2   Title Improve L knee AROM to 0-105 degrees.    Baseline -4 to 80 degrees    Time 8    Period Weeks    Status On-going      PT LONG TERM GOAL #3   Title Improve B quadriceps strength as assessed by MMT and functional scores.    Time 8    Period Weeks    Status On-going      PT LONG TERM GOAL #4   Title [name] will be independent with her long-term HEP at DC.    Time 8    Period Weeks    Status On-going                   Plan - 03/02/21 0093     Clinical Impression Statement Presentation of bilateral knee flexion restrictions in active and passive mobility noted with functional limitations in mobilty overall c symptoms noted at times and end range.  Pt. to benefit from continued neuro muscular re-ed balance intervention to improve movement coordination for transitioning from [facility] use.    Personal Factors and Comorbidities Comorbidity 2    Comorbidities R TKA (X2) and HTN    Examination-Activity Limitations Bathing;Dressing;Transfers;Bed Mobility;Sleep;Squat;Lift;Bend;Locomotion Level;Stairs;Stand;Carry    Examination-Participation Restrictions Interpersonal Relationship;Occupation;Yard Work;Laundry;Cleaning;Community Activity;Driving    Stability/Clinical Decision Making Stable/Uncomplicated    Rehab Potential Good    PT Frequency 3x / week    PT Duration 8 weeks    PT Treatment/Interventions ADLs/Self Care Home Management;Electrical Stimulation;Cryotherapy;Therapeutic activities;Stair training;Gait training;Therapeutic exercise;Balance training;Neuromuscular re-education;Patient/family education;Manual techniques;Passive range of motion;Vasopneumatic Device    PT Next Visit Plan Neuro re-ed on compliant and noncompliant surfaces, stairs as tolerated, manual for Lt knee flexion gains.  Avoid Rt knee  exacerbation in exercise interventions    PT Home Exercise Plan Access Code: [email or web address]  Consulted and Agree with Plan of Care Patient             Patient will benefit from skilled therapeutic intervention in order to improve the following deficits and impairments:  Abnormal gait, Decreased activity tolerance, Decreased endurance, Decreased range of motion, Decreased strength, Difficulty walking, Increased edema, Impaired flexibility, Pain  Visit Diagnosis: Difficulty walking  Muscle weakness (generalized)  Stiffness of left knee, not elsewhere classified  Localized edema  Acute pain of left knee     Problem List Patient Active Problem List   Diagnosis Date Noted   Status post total left knee replacement 01/27/2021   Unilateral primary osteoarthritis, left knee 11/27/2020   Primary osteoarthritis of both hands 11/17/2016   Chronic pain of right knee 10/03/2016   History of hypertension 10/03/2016   History of migraine 10/03/2016   Gastroesophageal reflux disease without esophagitis 10/03/2016   History of herpes simplex infection 10/03/2016   History of total knee replacement, right 06/18/2014   Other chronic postoperative pain 01/25/2014   Osteoarthritis of left knee 01/25/2014   Postoperative anemia due to acute blood loss 02/24/2012   Obesity (BMI 30-39.9) 02/24/2012    Scot Jun, PT, DPT, OCS, ATC 03/02/21  8:46 AM    [facility] Therapy [address] [place], [place], [address] Phone: [phone]   Fax:  [phone]  Name: Cheyenne Graham MRN: [id number] Date of Birth: [birth year]

## 2021-03-04 ENCOUNTER — Encounter: Payer: Federal, State, Local not specified - PPO | Admitting: Rehabilitative and Restorative Service Providers"

## 2021-03-06 ENCOUNTER — Encounter: Payer: Self-pay | Admitting: Rehabilitative and Restorative Service Providers"

## 2021-03-06 ENCOUNTER — Ambulatory Visit (INDEPENDENT_AMBULATORY_CARE_PROVIDER_SITE_OTHER): Payer: Federal, State, Local not specified - PPO | Admitting: Rehabilitative and Restorative Service Providers"

## 2021-03-06 ENCOUNTER — Other Ambulatory Visit: Payer: Self-pay

## 2021-03-06 ENCOUNTER — Other Ambulatory Visit: Payer: Self-pay | Admitting: Orthopaedic Surgery

## 2021-03-06 ENCOUNTER — Telehealth: Payer: Self-pay | Admitting: Orthopaedic Surgery

## 2021-03-06 DIAGNOSIS — R262 Difficulty in walking, not elsewhere classified: Secondary | ICD-10-CM | POA: Diagnosis not present

## 2021-03-06 DIAGNOSIS — M25662 Stiffness of left knee, not elsewhere classified: Secondary | ICD-10-CM

## 2021-03-06 DIAGNOSIS — M6281 Muscle weakness (generalized): Secondary | ICD-10-CM | POA: Diagnosis not present

## 2021-03-06 DIAGNOSIS — R600 Localized edema: Secondary | ICD-10-CM

## 2021-03-06 MED ORDER — METHOCARBAMOL 500 MG PO TABS: 500.0000 mg | ORAL_TABLET | Freq: Four times a day (QID) | ORAL | 1 refills | Status: AC | PRN

## 2021-03-06 MED ORDER — OXYCODONE HCL 5 MG PO TABS: 5.0000 mg | ORAL_TABLET | Freq: Four times a day (QID) | ORAL | 0 refills | Status: DC | PRN

## 2021-03-06 NOTE — Telephone Encounter (Signed)
Pt asked for a refill on her prescription of Robaxin and Oxycodone. Pt has a follow up appt sched for Tuesday but will run out of the medication this Sunday prior to her appt. The best pharmacy is the one on file and the best call back number is [phone].

## 2021-03-06 NOTE — Patient Instructions (Signed)
Access Code: [email or web address] URL: [email or web address] Date: 03/06/2021 Prepared by: Vista Mink  Exercises Supine Quadricep Sets - 3-5 x daily - 7 x weekly - 2-3 sets - 10 reps - 5 second hold Seated Knee Flexion AAROM - 3-5 x daily - 7 x weekly - 1 sets - 1 reps - 3 minutes hold Small Range Straight Leg Raise - 1-2 x daily - 7 x weekly - 4-10 sets - 5 reps - 3 seconds hold

## 2021-03-06 NOTE — Telephone Encounter (Signed)
Please advise 

## 2021-03-06 NOTE — Therapy (Addendum)
[facility] [address] [place], [place], [address] Phone: [phone]   Fax:  [phone]  Physical Therapy Treatment/Reassessment  Patient Details  Name: Cheyenne Graham MRN: [id number] Date of Birth: 1966/02/01 Referring Provider (PT): Mcarthur Rossetti MD   Encounter Date: 03/06/2021   PT End of Session - 03/06/21 1736     Visit Number 7    Number of Visits 24    Progress Note Due on Visit 10    PT Start Time 0856    PT Stop Time 0944    PT Time Calculation (min) 48 min    Activity Tolerance Patient tolerated treatment well    Behavior During Therapy Cheyenne Graham for tasks assessed/performed             Past Medical History:  Diagnosis Date   Arthritis    Constipation    Family history of anesthesia complication    ?  Aunt did not wake up.  Cheyenne Graham not sure where she had surgery,l   GERD (gastroesophageal reflux disease)    decreased since she is not taking advail   Herpes simplex    Hypertension    in the history, resolved after diet changes   Migraine     2 a week   Pneumonia        PONV (postoperative nausea and vomiting)     Past Surgical History:  Procedure Laterality Date   Fatty tumor     KNEE ARTHROSCOPY Right    KNEE SURGERY  02/22/2012   KNEE SURGERY Right 06/18/2014   TOTAL KNEE ARTHROPLASTY  02/22/2012   Procedure: TOTAL KNEE ARTHROPLASTY;  Surgeon: Garald Balding, MD;  Location: [place];  Service: Orthopedics;  Laterality: Right;   TOTAL KNEE ARTHROPLASTY Left 01/27/2021   Procedure: LEFT TOTAL KNEE ARTHROPLASTY;  Surgeon: Mcarthur Rossetti, MD;  Location: [place];  Service: Orthopedics;  Laterality: Left;   TOTAL KNEE REVISION Right 06/18/2014   Procedure: RIGHT TOTAL KNEE REVISION;  Surgeon: Garald Balding, MD;  Location: [place];  Service: Orthopedics;  Laterality: Right;   TUBAL LIGATION      There were no vitals filed for this visit.   Subjective Assessment - 03/06/21 1732     Subjective Cheyenne Graham  notes progress with her AROM and gat since starting PT.    Pertinent History R TKA (X2) and HTN    Limitations Sitting;House hold activities;Standing;Walking    How long can you sit comfortably? 45 minutes (was 30 minutes)    How long can you stand comfortably? 45 minutes (was 15 minutes)    How long can you walk comfortably? 15 minutes (was 10 minutes)    Patient Stated Goals Improve comfort with standing and walking    Currently in Pain? Yes    Pain Score 4     Pain Location Knee    Pain Orientation Left    Pain Descriptors / Indicators Tightness;Aching;Sore    Pain Type Chronic pain;Surgical pain    Pain Radiating Towards NA    Pain Onset More than a month ago    Pain Frequency Constant    Aggravating Factors  Standing and walking    Pain Relieving Factors Rest and exercises    Effect of Pain on Daily Activities Limits WB function and she has to pace herself around the house    Multiple Pain Sites No                [facility] PT Assessment - 03/06/21  0001       AROM   Overall AROM  Deficits    AROM Assessment Site Knee    Right/Left Knee Left;Right    Right Knee Extension 0    Right Knee Flexion 84    Left Knee Extension -2    Left Knee Flexion 92      Strength   Overall Strength Deficits    Strength Assessment Site Knee    Right/Left Knee Left;Right    Right Knee Extension 2+/5    Left Knee Extension 2+/5                           [facility] Adult PT Treatment/Exercise - 03/06/21 0001       Exercises   Exercises Knee/Hip      Knee/Hip Exercises: Aerobic   Recumbent Bike Seat 6 AROM for 5 minutes      Knee/Hip Exercises: Seated   Long Arc Quad Strengthening;Both;3 sets;5 reps;Limitations    Long Arc Quad Limitations Seated straight leg raises (toes back, press knee down-avoiding hyperextension and tighten thigh)    Knee/Hip Flexion Seated knee flexion with R leg overpressure 10X 10 seconds      Vasopneumatic   Number Minutes Vasopneumatic  10  minutes    Vasopnuematic Location  Knee    Vasopneumatic Pressure Medium    Vasopneumatic Temperature  34                    PT Education - 03/06/21 1733     Education Details Reviewed exam findings and priorities with her HEP.    Person(s) Educated Patient    Methods Explanation;Demonstration;Tactile cues;Verbal cues;Handout    Comprehension Verbal cues required;Need further instruction;Returned demonstration;Verbalized understanding;Tactile cues required              PT Short Term Goals - 03/06/21 1734       PT SHORT TERM GOAL #1   Title Improve L knee AROM to -3 to 90 degrees.    Baseline -2 to 92 degrees (was -5 to 64 degrees at evaluation)    Time 4    Period Weeks    Status Achieved    Target Date 03/12/21               PT Long Term Goals - 03/06/21 1734       PT LONG TERM GOAL #1   Title Improve FOTO to 43.    Baseline 40 (was 15)    Time 8    Period Weeks    Status On-going      PT LONG TERM GOAL #2   Title Improve L knee AROM to 0-105 degrees.    Baseline -2 to 92 degrees    Time 8    Period Weeks    Status On-going      PT LONG TERM GOAL #3   Title Improve B quadriceps strength as assessed by MMT and functional scores.    Baseline Needs more range to get to 3/5 or better    Time 8    Period Weeks    Status On-going      PT LONG TERM GOAL #4   Title Cheyenne Graham will be independent with her long-term HEP at [place].    Time 8    Period Weeks    Status On-going                   Plan - 03/06/21 1737  Clinical Impression Statement Cheyenne Graham is making good early progress with her supervised PT.  L knee AROM is now -2 to 92 degrees (was -5 to 64 degrees).  R knee AROM is also very limited and Cheyenne Graham has a history of 2 previous R TKAs.  Strength is improving and will benefit from continued work as will balance, gait quality and self-reported function (40, was 15, Goal 43).  She will benefit from continued supervised PT to meet long-term  goals.    Personal Factors and Comorbidities Comorbidity 2    Comorbidities R TKA (X2) and HTN    Examination-Activity Limitations Bathing;Dressing;Transfers;Bed Mobility;Sleep;Squat;Lift;Bend;Locomotion Level;Stairs;Stand;Carry    Examination-Participation Restrictions Interpersonal Relationship;Occupation;Yard Work;Laundry;Cleaning;Community Activity;Driving    Stability/Clinical Decision Making Stable/Uncomplicated    Rehab Potential Good    PT Frequency 3x / week    PT Duration 4 weeks    PT Treatment/Interventions ADLs/Self Care Home Management;Electrical Stimulation;Cryotherapy;Therapeutic activities;Stair training;Gait training;Therapeutic exercise;Balance training;Neuromuscular re-education;Patient/family education;Manual techniques;Passive range of motion;Vasopneumatic Device    PT Next Visit Plan Continue AROM, B quadriceps strength, balance and functional activities.    PT Home Exercise Plan Access Code: [email or web address]    Consulted and Agree with Plan of Care Patient             Patient will benefit from skilled therapeutic intervention in order to improve the following deficits and impairments:  Abnormal gait, Decreased activity tolerance, Decreased endurance, Decreased range of motion, Decreased strength, Difficulty walking, Increased edema, Impaired flexibility, Pain  Visit Diagnosis: Difficulty walking  Muscle weakness (generalized)  Stiffness of left knee, not elsewhere classified  Localized edema     Problem List Patient Active Problem List   Diagnosis Date Noted   Status post total left knee replacement 01/27/2021   Unilateral primary osteoarthritis, left knee 11/27/2020   Primary osteoarthritis of both hands 11/17/2016   Chronic pain of right knee 10/03/2016   History of hypertension 10/03/2016   History of migraine 10/03/2016   Gastroesophageal reflux disease without esophagitis 10/03/2016   History of herpes simplex infection 10/03/2016   History of total  knee replacement, right 06/18/2014   Other chronic postoperative pain 01/25/2014   Osteoarthritis of left knee 01/25/2014   Postoperative anemia due to acute blood loss 02/24/2012   Obesity (BMI 30-39.9) 02/24/2012    Farley Ly PT, MPT 03/06/2021, 5:42 PM  [facility] [address] [place], [place], [address] Phone: [phone]   Fax:  [phone]  Name: Cheyenne Graham MRN: [id number] Date of Birth: 02/28/1966

## 2021-03-10 ENCOUNTER — Encounter: Payer: Self-pay | Admitting: Orthopaedic Surgery

## 2021-03-10 ENCOUNTER — Encounter: Payer: Federal, State, Local not specified - PPO | Admitting: Rehabilitative and Restorative Service Providers"

## 2021-03-10 ENCOUNTER — Encounter: Payer: Federal, State, Local not specified - PPO | Admitting: Orthopaedic Surgery

## 2021-03-10 ENCOUNTER — Other Ambulatory Visit: Payer: Self-pay

## 2021-03-10 ENCOUNTER — Ambulatory Visit (INDEPENDENT_AMBULATORY_CARE_PROVIDER_SITE_OTHER): Payer: Federal, State, Local not specified - PPO | Admitting: Orthopaedic Surgery

## 2021-03-10 DIAGNOSIS — Z96652 Presence of left artificial knee joint: Secondary | ICD-10-CM

## 2021-03-10 NOTE — Progress Notes (Signed)
The patient is 6 weeks today status post a left total knee arthroplasty.  We have already replaced her right knee.  She is only 55 years old.  Her job does require her standing all day long and lifting up to 40 pounds.  She is deftly not ready for this yet and I cannot clear her for that type of work carrier.  She is ambulate with a cane.  Both knees including her more recent operative left knee and her right knee flexed to just barely beyond 90 degrees.  Both knees are stable on exam otherwise.  She will continue to increase her activities as comfort allows and work on aggressive therapy to get her knees bending and moving.  We will reevaluate her in 4 weeks for potential return to work.  No x-rays needed at that visit.

## 2021-03-12 ENCOUNTER — Encounter: Payer: Federal, State, Local not specified - PPO | Admitting: Rehabilitative and Restorative Service Providers"

## 2021-03-12 ENCOUNTER — Telehealth: Payer: Self-pay | Admitting: Rehabilitative and Restorative Service Providers"

## 2021-03-12 NOTE — Telephone Encounter (Signed)
Left VM asking about 03/10/2021 appointment with Dr. Ninfa Linden and checking on Cheyenne Graham missed appointment today at 8:45.  Reminded Cheyenne Graham of Cheyenne Graham 03/16/2021 appointment at 8:45 AM and asked Cheyenne Graham to call and cancel at [phone] if unable to attend.

## 2021-03-16 ENCOUNTER — Ambulatory Visit (INDEPENDENT_AMBULATORY_CARE_PROVIDER_SITE_OTHER): Payer: Federal, State, Local not specified - PPO | Admitting: Rehabilitative and Restorative Service Providers"

## 2021-03-16 ENCOUNTER — Other Ambulatory Visit: Payer: Self-pay

## 2021-03-16 ENCOUNTER — Encounter: Payer: Self-pay | Admitting: Rehabilitative and Restorative Service Providers"

## 2021-03-16 DIAGNOSIS — R600 Localized edema: Secondary | ICD-10-CM | POA: Diagnosis not present

## 2021-03-16 DIAGNOSIS — M25562 Pain in left knee: Secondary | ICD-10-CM

## 2021-03-16 DIAGNOSIS — M25662 Stiffness of left knee, not elsewhere classified: Secondary | ICD-10-CM

## 2021-03-16 DIAGNOSIS — R262 Difficulty in walking, not elsewhere classified: Secondary | ICD-10-CM

## 2021-03-16 DIAGNOSIS — M6281 Muscle weakness (generalized): Secondary | ICD-10-CM

## 2021-03-16 NOTE — Therapy (Signed)
[facility] [address] [place], [place], [address] Phone: [phone]   Fax:  [phone]  Physical Therapy Treatment  Patient Details  Name: Cheyenne [facility] MRN: [id number] Date of Birth: [birth year] Referring Provider (PT): Mcarthur Rossetti MD   Encounter Date: 03/16/2021   PT End of Session - 03/16/21 0852     Visit Number 8    Number of Visits 24    Progress Note Due on Visit 17    PT Start Time 0851    PT Stop Time 0940    PT Time Calculation (min) 49 min    Activity Tolerance Patient tolerated treatment well    Behavior During Therapy [facility] for tasks assessed/performed             Past Medical History:  Diagnosis Date   Arthritis    Constipation    Family history of anesthesia complication    ?  Aunt did not wake up.  Ms [place] not sure where she had surgery,l   GERD (gastroesophageal reflux disease)    decreased since she is not taking advail   Herpes simplex    Hypertension    in the history, resolved after diet changes   Migraine     2 a week   Pneumonia        PONV (postoperative nausea and vomiting)     Past Surgical History:  Procedure Laterality Date   Fatty tumor     KNEE ARTHROSCOPY Right    KNEE SURGERY  02/22/2012   KNEE SURGERY Right 06/18/2014   TOTAL KNEE ARTHROPLASTY  02/22/2012   Procedure: TOTAL KNEE ARTHROPLASTY;  Surgeon: Garald Balding, MD;  Location: [place];  Service: Orthopedics;  Laterality: Right;   TOTAL KNEE ARTHROPLASTY Left 01/27/2021   Procedure: LEFT TOTAL KNEE ARTHROPLASTY;  Surgeon: Mcarthur Rossetti, MD;  Location: [place];  Service: Orthopedics;  Laterality: Left;   TOTAL KNEE REVISION Right 06/18/2014   Procedure: RIGHT TOTAL KNEE REVISION;  Surgeon: Garald Balding, MD;  Location: [place];  Service: Orthopedics;  Laterality: Right;   TUBAL LIGATION      There were no vitals filed for this visit.   Subjective Assessment - 03/16/21 0857     Subjective Pt. indicated  symptoms around 4/10, sometimes higher.  Pt. indicated doing steps more in house but down is still hard.    Pertinent History R TKA (X2) and HTN    Limitations Sitting;House hold activities;Standing;Walking    How long can you sit comfortably? --    How long can you stand comfortably? --    How long can you walk comfortably? --    Patient Stated Goals Improve comfort with standing and walking    Pain Score 4     Pain Location Knee    Pain Orientation Left    Pain Descriptors / Indicators Sore;Aching;Tightness    Pain Type Chronic pain;Surgical pain    Pain Onset More than a month ago    Pain Frequency Intermittent    Aggravating Factors  standing prolonged, stairs    Pain Relieving Factors rest                [facility] PT Assessment - 03/16/21 0001       Assessment   Medical Diagnosis Lt knee TKA    Referring Provider (PT) Mcarthur Rossetti MD    Onset Date/Surgical Date 01/27/21      AROM   Left Knee Extension -5   in  LAQ, -2 in supine quad set   Left Knee Flexion 94   in supine heel slide                          [facility] Adult PT Treatment/Exercise - 03/16/21 0001       Knee/Hip Exercises: Stretches   Passive Hamstring Stretch 30 seconds;3 reps;Left   supine c strap   Other Knee/Hip Stretches supine heel slide c strap 15 second hold x 5      Knee/Hip Exercises: Machines for Strengthening   Cybex Knee Extension Lt 5 lbs 3 x 10 c eccentric focus      Knee/Hip Exercises: Standing   Lateral Step Up Step Height: 4";10 reps;2 sets;Left   eccentric step down focus (heel touch first)     Knee/Hip Exercises: Seated   Other Seated Knee/Hip Exercises seated SLR 2 x 10 on Lt      Vasopneumatic   Number Minutes Vasopneumatic  10 minutes    Vasopnuematic Location  Knee    Vasopneumatic Pressure Medium    Vasopneumatic Temperature  34      Manual Therapy   Manual therapy comments seated Lt knee distraction c flexion, IR mobilization c movement c  contralateral leg movement opposite.                      PT Short Term Goals - 03/06/21 1734       PT SHORT TERM GOAL #1   Title Improve L knee AROM to -3 to 90 degrees.    Baseline -2 to 92 degrees (was -5 to 64 degrees at evaluation)    Time 4    Period Weeks    Status Achieved    Target Date 03/12/21               PT Long Term Goals - 03/06/21 1734       PT LONG TERM GOAL #1   Title Improve FOTO to 43.    Baseline 40 (was 15)    Time 8    Period Weeks    Status On-going      PT LONG TERM GOAL #2   Title Improve L knee AROM to 0-105 degrees.    Baseline -2 to 92 degrees    Time 8    Period Weeks    Status On-going      PT LONG TERM GOAL #3   Title Improve B quadriceps strength as assessed by MMT and functional scores.    Baseline Needs more range to get to 3/5 or better    Time 8    Period Weeks    Status On-going      PT LONG TERM GOAL #4   Title Irlanda will be independent with her long-term HEP at [place].    Time 8    Period Weeks    Status On-going                   Plan - 03/16/21 0915     Clinical Impression Statement Bilateral knee flexion restrictions continue to play impact on ability to perform daily movement with Lt knee passing Rt knee at this time.  Pt. to continue to benefit from eccentric loading control improvements for Lt leg for stair navigation and other daily functional activity to improve ability c reduced difficulty.    Personal Factors and Comorbidities Comorbidity 2    Comorbidities R TKA (X2) and HTN  Examination-Activity Limitations Bathing;Dressing;Transfers;Bed Mobility;Sleep;Squat;Lift;Bend;Locomotion Level;Stairs;Stand;Carry    Examination-Participation Restrictions Interpersonal Relationship;Occupation;Yard Work;Laundry;Cleaning;Community Activity;Driving    Stability/Clinical Decision Making Stable/Uncomplicated    Rehab Potential Good    PT Frequency 3x / week    PT Duration 4 weeks    PT  Treatment/Interventions ADLs/Self Care Home Management;Electrical Stimulation;Cryotherapy;Therapeutic activities;Stair training;Gait training;Therapeutic exercise;Balance training;Neuromuscular re-education;Patient/family education;Manual techniques;Passive range of motion;Vasopneumatic Device    PT Next Visit Plan Functional stair navigation c eccentric control improvements in (stairs, squat control, LAQ)    PT Home Exercise Plan Access Code: [email or web address]    Consulted and Agree with Plan of Care Patient             Patient will benefit from skilled therapeutic intervention in order to improve the following deficits and impairments:  Abnormal gait, Decreased activity tolerance, Decreased endurance, Decreased range of motion, Decreased strength, Difficulty walking, Increased edema, Impaired flexibility, Pain  Visit Diagnosis: Difficulty walking  Muscle weakness (generalized)  Localized edema  Stiffness of left knee, not elsewhere classified  Acute pain of left knee     Problem List Patient Active Problem List   Diagnosis Date Noted   Status post total left knee replacement 01/27/2021   Unilateral primary osteoarthritis, left knee 11/27/2020   Primary osteoarthritis of both hands 11/17/2016   Chronic pain of right knee 10/03/2016   History of hypertension 10/03/2016   History of migraine 10/03/2016   Gastroesophageal reflux disease without esophagitis 10/03/2016   History of herpes simplex infection 10/03/2016   History of total knee replacement, right 06/18/2014   Other chronic postoperative pain 01/25/2014   Osteoarthritis of left knee 01/25/2014   Postoperative anemia due to acute blood loss 02/24/2012   Obesity (BMI 30-39.9) 02/24/2012    Scot Jun, PT, DPT, OCS, ATC 03/16/21  9:28 AM    [facility] [address] [place], [place], [address] Phone: [phone]   Fax:  [phone]  Name: CHARLETTE HENNINGS MRN: [id number] Date of  Birth: 1965/10/06

## 2021-03-18 ENCOUNTER — Other Ambulatory Visit: Payer: Self-pay

## 2021-03-18 ENCOUNTER — Ambulatory Visit (INDEPENDENT_AMBULATORY_CARE_PROVIDER_SITE_OTHER): Payer: Federal, State, Local not specified - PPO | Admitting: Rehabilitative and Restorative Service Providers"

## 2021-03-18 DIAGNOSIS — R262 Difficulty in walking, not elsewhere classified: Secondary | ICD-10-CM | POA: Diagnosis not present

## 2021-03-18 DIAGNOSIS — R600 Localized edema: Secondary | ICD-10-CM

## 2021-03-18 DIAGNOSIS — M25562 Pain in left knee: Secondary | ICD-10-CM

## 2021-03-18 DIAGNOSIS — M6281 Muscle weakness (generalized): Secondary | ICD-10-CM

## 2021-03-18 DIAGNOSIS — M25662 Stiffness of left knee, not elsewhere classified: Secondary | ICD-10-CM

## 2021-03-18 NOTE — Therapy (Signed)
[facility] [address] [place], [place], [address] Phone: [phone]   Fax:  [phone]  Physical Therapy Treatment  Patient Details  Name: Cheyenne Graham MRN: [id number] Date of Birth: [birth year] Referring Provider (PT): Mcarthur Rossetti MD   Encounter Date: 03/18/2021   PT End of Session - 03/18/21 1026     Visit Number 9    Number of Visits 24    Date for PT Re-Evaluation 04/09/21    Progress Note Due on Visit 17    PT Start Time 1022    PT Stop Time 1110    PT Time Calculation (min) 48 min    Activity Tolerance Patient tolerated treatment well    Behavior During Therapy [facility] for tasks assessed/performed             Past Medical History:  Diagnosis Date   Arthritis    Constipation    Family history of anesthesia complication    ?  Aunt did not wake up.  Cheyenne Graham not sure where she had surgery,l   GERD (gastroesophageal reflux disease)    decreased since she is not taking advail   Herpes simplex    Hypertension    in the history, resolved after diet changes   Migraine     2 a week   Pneumonia        PONV (postoperative nausea and vomiting)     Past Surgical History:  Procedure Laterality Date   Fatty tumor     KNEE ARTHROSCOPY Right    KNEE SURGERY  02/22/2012   KNEE SURGERY Right 06/18/2014   TOTAL KNEE ARTHROPLASTY  02/22/2012   Procedure: TOTAL KNEE ARTHROPLASTY;  Surgeon: Garald Balding, MD;  Location: [place];  Service: Orthopedics;  Laterality: Right;   TOTAL KNEE ARTHROPLASTY Left 01/27/2021   Procedure: LEFT TOTAL KNEE ARTHROPLASTY;  Surgeon: Mcarthur Rossetti, MD;  Location: [place];  Service: Orthopedics;  Laterality: Left;   TOTAL KNEE REVISION Right 06/18/2014   Procedure: RIGHT TOTAL KNEE REVISION;  Surgeon: Garald Balding, MD;  Location: [place];  Service: Orthopedics;  Laterality: Right;   TUBAL LIGATION      There were no vitals filed for this visit.   Subjective Assessment - 03/18/21  1025     Subjective Pt. indicated knee was sore for a few hours after last visit but it reduced.  Pt. indicated Rt knee continued to act up.  Stiffness indicated today.    Pertinent History R TKA (X2) and HTN    Limitations Sitting;House hold activities;Standing;Walking    Patient Stated Goals Improve comfort with standing and walking    Pain Location Knee    Pain Onset More than a month ago                               [facility] Adult PT Treatment/Exercise - 03/18/21 0001       Knee/Hip Exercises: Stretches   Passive Hamstring Stretch 3 reps;Left;30 seconds    Other Knee/Hip Stretches supine heel slide c strap 15 second hold x 5    Other Knee/Hip Stretches supine TKE stretch c vaso 10 mins in supine      Knee/Hip Exercises: Aerobic   Recumbent Bike Seat 6 full revolutions 8 mins      Knee/Hip Exercises: Machines for Strengthening   Cybex Knee Extension held due ut Rt leg complaints (performed c seated ankle weight instead)  Total Gym Leg Press Lt leg only 2 x 15 43 lbs (no Rt leg due to pain irriations)      Knee/Hip Exercises: Standing   Lateral Step Up Step Height: 4";10 reps;2 sets;Left   eccentric step down focus     Knee/Hip Exercises: Seated   Long Arc Quad Left   3 x 10, 1 second pause in extension and flexion end range   Long Arc Quad Weight 5 lbs.      Vasopneumatic   Number Minutes Vasopneumatic  10 minutes    Vasopnuematic Location  Knee    Vasopneumatic Pressure Medium    Vasopneumatic Temperature  34      Manual Therapy   Manual therapy comments seated Lt knee distraction c flexion, IR mobilization c movement c contralateral leg movement opposite.  contract/relax techniques for Lt quad                      PT Short Term Goals - 03/06/21 1734       PT SHORT TERM GOAL #1   Title Improve L knee AROM to -3 to 90 degrees.    Baseline -2 to 92 degrees (was -5 to 64 degrees at evaluation)    Time 4    Period Weeks    Status  Achieved    Target Date 03/12/21               PT Long Term Goals - 03/06/21 1734       PT LONG TERM GOAL #1   Title Improve FOTO to 43.    Baseline 40 (was 15)    Time 8    Period Weeks    Status On-going      PT LONG TERM GOAL #2   Title Improve L knee AROM to 0-105 degrees.    Baseline -2 to 92 degrees    Time 8    Period Weeks    Status On-going      PT LONG TERM GOAL #3   Title Improve B quadriceps strength as assessed by MMT and functional scores.    Baseline Needs more range to get to 3/5 or better    Time 8    Period Weeks    Status On-going      PT LONG TERM GOAL #4   Title Adaley will be independent with her long-term HEP at DC.    Time 8    Period Weeks    Status On-going                   Plan - 03/18/21 1035     Clinical Impression Statement Adjusted intervention today to avoid any exacerbation of Rt leg complaints.  Pt. will continue to benefit on focus on building quad strength in WB activity as well as progressing overall flexion mobility to improve functional ROM.  Pt. has demonstrated good benefits from mixture of manual intervention for motion gains as well as ther ex interventions.    Personal Factors and Comorbidities Comorbidity 2    Comorbidities R TKA (X2) and HTN    Examination-Activity Limitations Bathing;Dressing;Transfers;Bed Mobility;Sleep;Squat;Lift;Bend;Locomotion Level;Stairs;Stand;Carry    Examination-Participation Restrictions Interpersonal Relationship;Occupation;Yard Work;Laundry;Cleaning;Community Activity;Driving    Stability/Clinical Decision Making Stable/Uncomplicated    Rehab Potential Good    PT Frequency 3x / week    PT Duration 4 weeks    PT Treatment/Interventions ADLs/Self Care Home Management;Electrical Stimulation;Cryotherapy;Therapeutic activities;Stair training;Gait training;Therapeutic exercise;Balance training;Neuromuscular re-education;Patient/family education;Manual techniques;Passive range of  motion;Vasopneumatic Device  PT Next Visit Plan Functional stair navigation c eccentric control improvements, manual and ther ex intervention for end range progressions. -MBW    PT Home Exercise Plan Access Code: [email or web address]    Consulted and Agree with Plan of Care Patient             Patient will benefit from skilled therapeutic intervention in order to improve the following deficits and impairments:  Abnormal gait, Decreased activity tolerance, Decreased endurance, Decreased range of motion, Decreased strength, Difficulty walking, Increased edema, Impaired flexibility, Pain  Visit Diagnosis: Difficulty walking  Muscle weakness (generalized)  Localized edema  Stiffness of left knee, not elsewhere classified  Acute pain of left knee     Problem List Patient Active Problem List   Diagnosis Date Noted   Status post total left knee replacement 01/27/2021   Unilateral primary osteoarthritis, left knee 11/27/2020   Primary osteoarthritis of both hands 11/17/2016   Chronic pain of right knee 10/03/2016   History of hypertension 10/03/2016   History of migraine 10/03/2016   Gastroesophageal reflux disease without esophagitis 10/03/2016   History of herpes simplex infection 10/03/2016   History of total knee replacement, right 06/18/2014   Other chronic postoperative pain 01/25/2014   Osteoarthritis of left knee 01/25/2014   Postoperative anemia due to acute blood loss 02/24/2012   Obesity (BMI 30-39.9) 02/24/2012    Scot Jun, PT, DPT, OCS, ATC 03/18/21  10:51 AM    [facility] [address] [place], [place], [address] Phone: [phone]   Fax:  [phone]  Name: SHALYNN JORSTAD MRN: [id number] Date of Birth: 1965-11-19

## 2021-03-20 ENCOUNTER — Other Ambulatory Visit: Payer: Self-pay

## 2021-03-20 ENCOUNTER — Encounter: Payer: Self-pay | Admitting: Rehabilitative and Restorative Service Providers"

## 2021-03-20 ENCOUNTER — Telehealth: Payer: Self-pay | Admitting: Orthopaedic Surgery

## 2021-03-20 ENCOUNTER — Other Ambulatory Visit: Payer: Self-pay | Admitting: Orthopaedic Surgery

## 2021-03-20 ENCOUNTER — Ambulatory Visit (INDEPENDENT_AMBULATORY_CARE_PROVIDER_SITE_OTHER): Payer: Federal, State, Local not specified - PPO | Admitting: Rehabilitative and Restorative Service Providers"

## 2021-03-20 DIAGNOSIS — M6281 Muscle weakness (generalized): Secondary | ICD-10-CM | POA: Diagnosis not present

## 2021-03-20 DIAGNOSIS — M25662 Stiffness of left knee, not elsewhere classified: Secondary | ICD-10-CM

## 2021-03-20 DIAGNOSIS — R262 Difficulty in walking, not elsewhere classified: Secondary | ICD-10-CM

## 2021-03-20 DIAGNOSIS — R600 Localized edema: Secondary | ICD-10-CM

## 2021-03-20 MED ORDER — OXYCODONE HCL 5 MG PO TABS: 5.0000 mg | ORAL_TABLET | Freq: Four times a day (QID) | ORAL | 0 refills | Status: AC | PRN

## 2021-03-20 NOTE — Therapy (Signed)
[facility] [address] [place], [place], [address] Phone: [phone]   Fax:  [phone]  Physical Therapy Treatment  Patient Details  Name: Cheyenne Graham MRN: [id number] Date of Birth: Sep 06, 1966 Referring Provider (PT): Mcarthur Rossetti MD   Encounter Date: 03/20/2021   PT End of Session - 03/20/21 0927     Visit Number 10    Number of Visits 24    Date for PT Re-Evaluation 04/09/21    Progress Note Due on Visit 17    PT Start Time 0853    PT Stop Time 0942    PT Time Calculation (min) 49 min    Activity Tolerance Patient tolerated treatment well    Behavior During Therapy [facility] for tasks assessed/performed             Past Medical History:  Diagnosis Date   Arthritis    Constipation    Family history of anesthesia complication    ?  Aunt did not wake up.  Ms Vanwyk not sure where she had surgery,l   GERD (gastroesophageal reflux disease)    decreased since she is not taking advail   Herpes simplex    Hypertension    in the history, resolved after diet changes   Migraine     2 a week   Pneumonia        PONV (postoperative nausea and vomiting)     Past Surgical History:  Procedure Laterality Date   Fatty tumor     KNEE ARTHROSCOPY Right    KNEE SURGERY  02/22/2012   KNEE SURGERY Right 06/18/2014   TOTAL KNEE ARTHROPLASTY  02/22/2012   Procedure: TOTAL KNEE ARTHROPLASTY;  Surgeon: Garald Balding, MD;  Location: [place];  Service: Orthopedics;  Laterality: Right;   TOTAL KNEE ARTHROPLASTY Left 01/27/2021   Procedure: LEFT TOTAL KNEE ARTHROPLASTY;  Surgeon: Mcarthur Rossetti, MD;  Location: [place];  Service: Orthopedics;  Laterality: Left;   TOTAL KNEE REVISION Right 06/18/2014   Procedure: RIGHT TOTAL KNEE REVISION;  Surgeon: Garald Balding, MD;  Location: [place];  Service: Orthopedics;  Laterality: Right;   TUBAL LIGATION      There were no vitals filed for this visit.   Subjective Assessment - 03/20/21  0901     Subjective [name] reports good HEP compliance over the last week.  Knee flexion AROM and R knee pain are most concerning to her.    Pertinent History R TKA (X2) and HTN    Limitations Sitting;House hold activities;Standing;Walking    How long can you sit comfortably? 1 hour (was 30 minutes)    How long can you stand comfortably? 45-60 minutes (was 15 minutes)    How long can you walk comfortably? 15 minutes (was 10 minutes) R knee limited    Patient Stated Goals Improve comfort with standing and walking    Currently in Pain? Yes    Pain Score 4     Pain Location Knee    Pain Orientation Left    Pain Descriptors / Indicators Aching;Tightness;Sore    Pain Type Chronic pain;Surgical pain    Pain Radiating Towards NA    Pain Onset More than a month ago    Pain Frequency Intermittent    Aggravating Factors  Prolonged WB, descending stairs    Pain Relieving Factors Rest    Effect of Pain on Daily Activities Limits WB function and she has to pace herself with house chores, not yet back to full-time work.  Multiple Pain Sites No                [facility] PT Assessment - 03/20/21 0001       AROM   Left Knee Extension -2    Left Knee Flexion 94                           [facility] Adult PT Treatment/Exercise - 03/20/21 0001       Exercises   Exercises Knee/Hip      Knee/Hip Exercises: Aerobic   Recumbent Bike Seat 6 AROM for 8 minutes      Knee/Hip Exercises: Machines for Strengthening   Total Gym Leg Press L leg only 2 sets of 15 for 50# slow eccentrics      Knee/Hip Exercises: Seated   Knee/Hip Flexion Seated knee flexion with R leg overpressure 10X 10 seconds    Other Seated Knee/Hip Exercises Seated straight leg raises 3 sets of 5 (toes back, tighten thigh, pause 2 seconds, lift heel 6-8 inches, slowly lower, repeat)      Vasopneumatic   Number Minutes Vasopneumatic  10 minutes    Vasopnuematic Location  Knee    Vasopneumatic Pressure Medium     Vasopneumatic Temperature  34                    PT Education - 03/20/21 0905     Education Details Reviewed emphasis on quadriceps strength and knee flexion AROM with HEP.    Person(s) Educated Patient    Methods Explanation;Demonstration;Tactile cues;Verbal cues    Comprehension Verbal cues required;Need further instruction;Returned demonstration;Verbalized understanding;Tactile cues required              PT Short Term Goals - 03/06/21 1734       PT SHORT TERM GOAL #1   Title Improve L knee AROM to -3 to 90 degrees.    Baseline -2 to 92 degrees (was -5 to 64 degrees at evaluation)    Time 4    Period Weeks    Status Achieved    Target Date 03/12/21               PT Long Term Goals - 03/20/21 0927       PT LONG TERM GOAL #1   Title Improve FOTO to 43.    Baseline 40 (was 15)    Time 8    Period Weeks    Status On-going      PT LONG TERM GOAL #2   Title Improve L knee AROM to 0-105 degrees.    Baseline -2 to 94 degrees    Time 8    Period Weeks    Status On-going      PT LONG TERM GOAL #3   Title Improve B quadriceps strength as assessed by MMT and functional scores.    Baseline Needs more range to get to 3/5 or better    Time 8    Period Weeks    Status On-going      PT LONG TERM GOAL #4   Title Asjia will be independent with her long-term HEP at DC.    Time 8    Period Weeks    Status On-going                   Plan - 03/20/21 0928     Clinical Impression Statement Nicol continues to make slow but meausurable gains with her L  knee AROM.  Emphasis with her HEP is on L knee flexion AROM and B quadriceps strengthening.  Continue current POC to return full knee extension AROM, improve L knee flexion and B quadriceps strength in preparation for independent rehabilitation.    Personal Factors and Comorbidities Comorbidity 2    Comorbidities R TKA (X2) and HTN    Examination-Activity Limitations Bathing;Dressing;Transfers;Bed  Mobility;Sleep;Squat;Lift;Bend;Locomotion Level;Stairs;Stand;Carry    Examination-Participation Restrictions Interpersonal Relationship;Occupation;Yard Work;Laundry;Cleaning;Community Activity;Driving    Stability/Clinical Decision Making Stable/Uncomplicated    Rehab Potential Good    PT Frequency 3x / week    PT Duration 4 weeks    PT Treatment/Interventions ADLs/Self Care Home Management;Electrical Stimulation;Cryotherapy;Therapeutic activities;Stair training;Gait training;Therapeutic exercise;Balance training;Neuromuscular re-education;Patient/family education;Manual techniques;Passive range of motion;Vasopneumatic Device    PT Next Visit Plan Functional stair navigation c eccentric control improvements, manual and ther ex intervention for end range progressions. -MBW    PT Home Exercise Plan Access Code: [email or web address]    Consulted and Agree with Plan of Care Patient             Patient will benefit from skilled therapeutic intervention in order to improve the following deficits and impairments:  Abnormal gait, Decreased activity tolerance, Decreased endurance, Decreased range of motion, Decreased strength, Difficulty walking, Increased edema, Impaired flexibility, Pain  Visit Diagnosis: Difficulty walking  Muscle weakness (generalized)  Localized edema  Stiffness of left knee, not elsewhere classified     Problem List Patient Active Problem List   Diagnosis Date Noted   Status post total left knee replacement 01/27/2021   Unilateral primary osteoarthritis, left knee 11/27/2020   Primary osteoarthritis of both hands 11/17/2016   Chronic pain of right knee 10/03/2016   History of hypertension 10/03/2016   History of migraine 10/03/2016   Gastroesophageal reflux disease without esophagitis 10/03/2016   History of herpes simplex infection 10/03/2016   History of total knee replacement, right 06/18/2014   Other chronic postoperative pain 01/25/2014   Osteoarthritis of left knee  01/25/2014   Postoperative anemia due to acute blood loss 02/24/2012   Obesity (BMI 30-39.9) 02/24/2012    Farley Ly PT, MPT 03/20/2021, 4:42 PM  [facility] [address] [place], [place], [address] Phone: [phone]   Fax:  [phone]  Name: NATARSHA HURWITZ MRN: [id number] Date of Birth: 10-25-1965

## 2021-03-20 NOTE — Telephone Encounter (Signed)
Pt would like a refill of her oxycodone 5 mg rx; pt would like a CB when this has been sent in.   [phone]

## 2021-03-20 NOTE — Telephone Encounter (Signed)
Lvm informing pt.

## 2021-03-20 NOTE — Telephone Encounter (Signed)
Please advise 

## 2021-03-23 ENCOUNTER — Ambulatory Visit (INDEPENDENT_AMBULATORY_CARE_PROVIDER_SITE_OTHER): Payer: Federal, State, Local not specified - PPO | Admitting: Rehabilitative and Restorative Service Providers"

## 2021-03-23 ENCOUNTER — Other Ambulatory Visit: Payer: Self-pay

## 2021-03-23 ENCOUNTER — Encounter: Payer: Self-pay | Admitting: Rehabilitative and Restorative Service Providers"

## 2021-03-23 DIAGNOSIS — M25562 Pain in left knee: Secondary | ICD-10-CM | POA: Diagnosis not present

## 2021-03-23 DIAGNOSIS — R262 Difficulty in walking, not elsewhere classified: Secondary | ICD-10-CM

## 2021-03-23 DIAGNOSIS — R600 Localized edema: Secondary | ICD-10-CM | POA: Diagnosis not present

## 2021-03-23 DIAGNOSIS — M25662 Stiffness of left knee, not elsewhere classified: Secondary | ICD-10-CM

## 2021-03-23 DIAGNOSIS — M6281 Muscle weakness (generalized): Secondary | ICD-10-CM

## 2021-03-23 NOTE — Therapy (Signed)
[facility] [address] [place], [place], [address] Phone: [phone]   Fax:  [phone]  Physical Therapy Treatment  Patient Details  Name: Cheyenne Graham Graham MRN: [id number] Date of Birth: Dec 11, 1965 Referring Provider (PT): Cheyenne Graham Rossetti MD   Encounter Date: 03/23/2021   PT End of Session - 03/23/21 1013     Visit Number 11    Number of Visits 24    Date for PT Re-Evaluation 04/09/21    Progress Note Due on Visit 17    PT Start Time 0848    PT Stop Time 0927    PT Time Calculation (min) 39 min    Activity Tolerance Patient limited by pain   Rt knee pain   Behavior During Therapy [facility] for tasks assessed/performed             Past Medical History:  Diagnosis Date   Arthritis    Constipation    Family history of anesthesia complication    ?  Aunt did not wake up.  Cheyenne Graham Graham not sure where she had surgery,l   GERD (gastroesophageal reflux disease)    decreased since she is not taking advail   Herpes simplex    Hypertension    in the history, resolved after diet changes   Migraine     2 a week   Pneumonia        PONV (postoperative nausea and vomiting)     Past Surgical History:  Procedure Laterality Date   Fatty tumor     KNEE ARTHROSCOPY Right    KNEE SURGERY  02/22/2012   KNEE SURGERY Right 06/18/2014   TOTAL KNEE ARTHROPLASTY  02/22/2012   Procedure: TOTAL KNEE ARTHROPLASTY;  Surgeon: Cheyenne Balding, MD;  Location: [place];  Service: Orthopedics;  Laterality: Right;   TOTAL KNEE ARTHROPLASTY Left 01/27/2021   Procedure: LEFT TOTAL KNEE ARTHROPLASTY;  Surgeon: Cheyenne Graham Rossetti, MD;  Location: [place];  Service: Orthopedics;  Laterality: Left;   TOTAL KNEE REVISION Right 06/18/2014   Procedure: RIGHT TOTAL KNEE REVISION;  Surgeon: Cheyenne Balding, MD;  Location: [place];  Service: Orthopedics;  Laterality: Right;   TUBAL LIGATION      There were no vitals filed for this visit.   Subjective Assessment -  03/23/21 1012     Subjective Pt. indicated Lt knee hurting at 5/10 when pain is noted.  Pt. indicated Rt knee continued to give her more trouble and noted in some walking and some exercise like riding the bike.  Pt. stated Rt knee pain has led her to put more weight on Lt in walking which is aggravating.    Pertinent History R TKA (X2) and HTN    Limitations Sitting;House hold activities;Standing;Walking    Patient Stated Goals Improve comfort with standing and walking    Currently in Pain? Yes    Pain Score 5     Pain Location Knee    Pain Orientation Left    Pain Descriptors / Indicators Aching;Tightness;Sore    Pain Type Surgical pain;Chronic pain    Pain Onset More than a month ago    Pain Frequency Intermittent    Aggravating Factors  walking WB due to shift from Rt side, end range.    Pain Relieving Factors rest, sitting                               [facility] Adult PT Treatment/Exercise - 03/23/21  0001       Knee/Hip Exercises: Clinical research associate 30 seconds;3 reps;Both   incilne board     Knee/Hip Exercises: Aerobic   Recumbent Bike held due to Rt knee pain complaints during    Nustep Lvl 5 6.5 mins      Knee/Hip Exercises: Machines for Strengthening   Total Gym Leg Press L leg only 2 sets of 15 for 50# slow eccentrics      Knee/Hip Exercises: Seated   Knee/Hip Flexion Seated knee flexion with Rt leg overpressure 10X 10 seconds    Other Seated Knee/Hip Exercises SLR 3 x 8 on Lt, x 8 on Rt      Vasopneumatic   Vasopnuematic Location  Other (comment)   Pt. deferred     Manual Therapy   Manual therapy comments seated Lt knee distraction c flexion, IR mobilization c movement c contralateral leg movement opposite.  contract/relax techniques for Lt quad                      PT Short Term Goals - 03/06/21 1734       PT SHORT TERM GOAL #1   Title Improve L knee AROM to -3 to 90 degrees.    Baseline -2 to 92 degrees (was -5 to 64  degrees at evaluation)    Time 4    Period Weeks    Status Achieved    Target Date 03/12/21               PT Long Term Goals - 03/20/21 0927       PT LONG TERM GOAL #1   Title Improve FOTO to 43.    Baseline 40 (was 15)    Time 8    Period Weeks    Status On-going      PT LONG TERM GOAL #2   Title Improve L knee AROM to 0-105 degrees.    Baseline -2 to 94 degrees    Time 8    Period Weeks    Status On-going      PT LONG TERM GOAL #3   Title Improve B quadriceps strength as assessed by MMT and functional scores.    Baseline Needs more range to get to 3/5 or better    Time 8    Period Weeks    Status On-going      PT LONG TERM GOAL #4   Title [name] will be independent with her long-term HEP at DC.    Time 8    Period Weeks    Status On-going                   Plan - 03/23/21 1014     Clinical Impression Statement Pt. to benefit from continued skilled PT services including manual and ther ex intervention to promote improved mobility into Lt knee flexion c strengthening program.  Due to Rt knee complaints, held bike and recommendation for shift to use of Nustep for aerobic/ROM gains instead.    Personal Factors and Comorbidities Comorbidity 2    Comorbidities R TKA (X2) and HTN    Examination-Activity Limitations Bathing;Dressing;Transfers;Bed Mobility;Sleep;Squat;Lift;Bend;Locomotion Level;Stairs;Stand;Carry    Examination-Participation Restrictions Interpersonal Relationship;Occupation;Yard Work;Laundry;Cleaning;Community Activity;Driving    Stability/Clinical Decision Making Stable/Uncomplicated    Rehab Potential Good    PT Frequency 3x / week    PT Duration 4 weeks    PT Treatment/Interventions ADLs/Self Care Home Management;Electrical Stimulation;Cryotherapy;Therapeutic activities;Stair training;Gait training;Therapeutic exercise;Balance training;Neuromuscular re-education;Patient/family education;Manual techniques;Passive  range of  motion;Vasopneumatic Device    PT Next Visit Plan Avoid bike and Rt knee pain aggravation while working on strength, balance and Lt knee flexion gains.    PT Home Exercise Plan Access Code: [email or web address]    Consulted and Agree with Plan of Care Patient             Patient will benefit from skilled therapeutic intervention in order to improve the following deficits and impairments:  Abnormal gait, Decreased activity tolerance, Decreased endurance, Decreased range of motion, Decreased strength, Difficulty walking, Increased edema, Impaired flexibility, Pain  Visit Diagnosis: Acute pain of left knee  Stiffness of left knee, not elsewhere classified  Localized edema  Muscle weakness (generalized)  Difficulty walking     Problem List Patient Active Problem List   Diagnosis Date Noted   Status post total left knee replacement 01/27/2021   Unilateral primary osteoarthritis, left knee 11/27/2020   Primary osteoarthritis of both hands 11/17/2016   Chronic pain of right knee 10/03/2016   History of hypertension 10/03/2016   History of migraine 10/03/2016   Gastroesophageal reflux disease without esophagitis 10/03/2016   History of herpes simplex infection 10/03/2016   History of total knee replacement, right 06/18/2014   Other chronic postoperative pain 01/25/2014   Osteoarthritis of left knee 01/25/2014   Postoperative anemia due to acute blood loss 02/24/2012   Obesity (BMI 30-39.9) 02/24/2012    Scot Jun, PT, DPT, OCS, ATC 03/23/21  10:41 AM    [facility] [address] [place], [place], [address] Phone: [phone]   Fax:  [phone]  Name: Cheyenne Graham [facility] MRN: [id number] Date of Birth: Apr 17, 1966

## 2021-03-25 ENCOUNTER — Encounter: Payer: Federal, State, Local not specified - PPO | Admitting: Rehabilitative and Restorative Service Providers"

## 2021-03-27 ENCOUNTER — Ambulatory Visit (INDEPENDENT_AMBULATORY_CARE_PROVIDER_SITE_OTHER): Payer: Federal, State, Local not specified - PPO | Admitting: Rehabilitative and Restorative Service Providers"

## 2021-03-27 ENCOUNTER — Other Ambulatory Visit: Payer: Self-pay

## 2021-03-27 ENCOUNTER — Encounter: Payer: Self-pay | Admitting: Rehabilitative and Restorative Service Providers"

## 2021-03-27 DIAGNOSIS — M25662 Stiffness of left knee, not elsewhere classified: Secondary | ICD-10-CM | POA: Diagnosis not present

## 2021-03-27 DIAGNOSIS — R600 Localized edema: Secondary | ICD-10-CM

## 2021-03-27 DIAGNOSIS — M6281 Muscle weakness (generalized): Secondary | ICD-10-CM | POA: Diagnosis not present

## 2021-03-27 DIAGNOSIS — R262 Difficulty in walking, not elsewhere classified: Secondary | ICD-10-CM | POA: Diagnosis not present

## 2021-03-27 NOTE — Patient Instructions (Signed)
Access Code: [email or web address] URL: [email or web address] Date: 03/27/2021 Prepared by: Vista Mink  Exercises Supine Quadricep Sets - 3-5 x daily - 7 x weekly - 2-3 sets - 10 reps - 5 second hold Seated Knee Flexion AAROM - 3-5 x daily - 7 x weekly - 1 sets - 1 reps - 3 minutes hold Small Range Straight Leg Raise - 1-2 x daily - 7 x weekly - 4-10 sets - 5 reps - 3 seconds hold

## 2021-03-27 NOTE — Therapy (Signed)
[facility] Physical Therapy [address] [place], [place], [address] Phone: [phone]   Fax:  [phone]  Physical Therapy Treatment  Patient Details  Name: Cheyenne Graham MRN: [id number] Date of Birth: [birth year] Referring Provider (PT): Cheyenne Rossetti MD   Encounter Date: 03/27/2021   PT End of Session - 03/27/21 1007     Visit Number 12    Number of Visits 24    Date for PT Re-Evaluation 04/09/21    Progress Note Due on Visit 17    PT Start Time 0852    PT Stop Time 0942    PT Time Calculation (min) 50 min    Activity Tolerance Patient tolerated treatment well   Rt knee pain   Behavior During Therapy Cheyenne Graham for tasks assessed/performed             Past Medical History:  Diagnosis Date   Arthritis    Constipation    Family history of anesthesia complication    ?  Aunt did not wake up.  Cheyenne Graham not sure where she had surgery,l   GERD (gastroesophageal reflux disease)    decreased since she is not taking advail   Herpes simplex    Hypertension    in the history, resolved after diet changes   Migraine     2 a week   Pneumonia        PONV (postoperative nausea and vomiting)     Past Surgical History:  Procedure Laterality Date   Fatty tumor     KNEE ARTHROSCOPY Right    KNEE SURGERY  02/22/2012   KNEE SURGERY Right 06/18/2014   TOTAL KNEE ARTHROPLASTY  02/22/2012   Procedure: TOTAL KNEE ARTHROPLASTY;  Surgeon: Cheyenne Balding, MD;  Location: Home;  Service: Orthopedics;  Laterality: Right;   TOTAL KNEE ARTHROPLASTY Left 01/27/2021   Procedure: LEFT TOTAL KNEE ARTHROPLASTY;  Surgeon: Cheyenne Rossetti, MD;  Location: [place];  Service: Orthopedics;  Laterality: Left;   TOTAL KNEE REVISION Right 06/18/2014   Procedure: RIGHT TOTAL KNEE REVISION;  Surgeon: Cheyenne Balding, MD;  Location: [place];  Service: Orthopedics;  Laterality: Right;   TUBAL LIGATION      There were no vitals filed for this visit.   Subjective  Assessment - 03/27/21 0917     Subjective Cheyenne Graham is still taking an oxycodone every 18-24 hours for both knees.  Her R side has been bothering her more, likely due to overuse.    Pertinent History R TKA (X2) and HTN    Limitations Sitting;House hold activities;Standing;Walking    How long can you sit comfortably? 1 hour (was 30 minutes)    How long can you stand comfortably? 45-60 minutes (was 15 minutes)    How long can you walk comfortably? 15 minutes (was 10 minutes) R knee limited    Patient Stated Goals Improve comfort with standing and walking    Currently in Pain? Yes    Pain Score 4     Pain Location Knee    Pain Orientation Left    Pain Descriptors / Indicators Aching;Tightness;Sore    Pain Type Chronic pain;Surgical pain    Pain Radiating Towards NA    Pain Onset More than a month ago    Pain Frequency Intermittent    Aggravating Factors  Too much WB or prolonged sitting    Pain Relieving Factors Taking a break or change of position    Effect of Pain on Daily Activities Limited WB endurance.  Out of full-time work at the [employer].    Multiple Pain Sites No                [facility] PT Assessment - 03/27/21 0001       AROM   Left Knee Extension -2    Left Knee Flexion 98                           [facility] Adult PT Treatment/Exercise - 03/27/21 0001       Exercises   Exercises Knee/Hip      Knee/Hip Exercises: Seated   Knee/Hip Flexion Seated knee flexion with R leg overpressure 2 sets of 10X 10 seconds    Other Seated Knee/Hip Exercises Seated straight leg raises 5 sets of 5 (toes back, tighten thigh, pause 2 seconds, lift heel 6-8 inches, slowly lower, repeat)      Knee/Hip Exercises: Supine   Quad Sets Strengthening;Both;2 sets;20 reps;Limitations    Quad Sets Limitations 5 seconds toes back, press down, tighten thighs      Vasopneumatic   Number Minutes Vasopneumatic  10 minutes    Vasopnuematic Location  Knee    Vasopneumatic Pressure  Medium    Vasopneumatic Temperature  34                    PT Education - 03/27/21 1003     Education Details Focused on basic HEP exercises to allow Cheyenne Graham's R knee to rest while still emphasizing quadriceps strength and knee flexion AROM.    Person(s) Educated Patient    Methods Explanation;Demonstration;Tactile cues;Verbal cues;Handout    Comprehension Verbal cues required;Returned demonstration;Need further instruction;Verbalized understanding;Tactile cues required              PT Short Term Goals - 03/06/21 1734       PT SHORT TERM GOAL #1   Title Improve L knee AROM to -3 to 90 degrees.    Baseline -2 to 92 degrees (was -5 to 64 degrees at evaluation)    Time 4    Period Weeks    Status Achieved    Target Date 03/12/21               PT Long Term Goals - 03/27/21 1005       PT LONG TERM GOAL #1   Title Improve FOTO to 43.    Baseline 40 (was 15)    Time 8    Period Weeks    Status On-going    Target Date 04/09/21      PT LONG TERM GOAL #2   Title Improve L knee AROM to 0-105 degrees.    Baseline -2 to 98 degrees    Time 8    Period Weeks    Status On-going    Target Date 04/09/21      PT LONG TERM GOAL #3   Title Improve B quadriceps strength as assessed by MMT and functional scores.    Baseline Needs more range to get to 3/5 or better    Time 8    Period Weeks    Status On-going    Target Date 04/09/21      PT LONG TERM GOAL #4   Title Cheyenne Graham will be independent with her long-term HEP at DC.    Time 8    Period Weeks    Status On-going    Target Date 04/09/21  Plan - 03/27/21 1008     Clinical Impression Statement The focus of today's visit was on quadriceps strength and knee flexion AROM.  In order to allow Cheyenne Graham's over worked R knee to calm down, I recommended she focus on today's exercises at home over the next week.  I also encouraged her to use a cane as much as possible to reduce WB and potential  overuse.  She remains on track to meet LTGs associated with her L knee.    Personal Factors and Comorbidities Comorbidity 2    Comorbidities R TKA (X2) and HTN    Examination-Activity Limitations Bathing;Dressing;Transfers;Bed Mobility;Sleep;Squat;Lift;Bend;Locomotion Level;Stairs;Stand;Carry    Examination-Participation Restrictions Interpersonal Relationship;Occupation;Yard Work;Laundry;Cleaning;Community Activity;Driving    Stability/Clinical Decision Making Stable/Uncomplicated    Rehab Potential Good    PT Frequency 3x / week    PT Duration 2 weeks    PT Treatment/Interventions ADLs/Self Care Home Management;Electrical Stimulation;Cryotherapy;Therapeutic activities;Stair training;Gait training;Therapeutic exercise;Balance training;Neuromuscular re-education;Patient/family education;Manual techniques;Passive range of motion;Vasopneumatic Device    PT Next Visit Plan Quadriceps strength and knee flexion AROM while avoiding overuse.    PT Home Exercise Plan Access Code: [email or web address]    Consulted and Agree with Plan of Care Patient             Patient will benefit from skilled therapeutic intervention in order to improve the following deficits and impairments:  Abnormal gait, Decreased activity tolerance, Decreased endurance, Decreased range of motion, Decreased strength, Difficulty walking, Increased edema, Impaired flexibility, Pain  Visit Diagnosis: Difficulty walking  Muscle weakness (generalized)  Localized edema  Stiffness of left knee, not elsewhere classified     Problem List Patient Active Problem List   Diagnosis Date Noted   Status post total left knee replacement 01/27/2021   Unilateral primary osteoarthritis, left knee 11/27/2020   Primary osteoarthritis of both hands 11/17/2016   Chronic pain of right knee 10/03/2016   History of hypertension 10/03/2016   History of migraine 10/03/2016   Gastroesophageal reflux disease without esophagitis 10/03/2016   History of  herpes simplex infection 10/03/2016   History of total knee replacement, right 06/18/2014   Other chronic postoperative pain 01/25/2014   Osteoarthritis of left knee 01/25/2014   Postoperative anemia due to acute blood loss 02/24/2012   Obesity (BMI 30-39.9) 02/24/2012    Farley Ly PT, MPT 03/27/2021, 10:15 AM  [facility] Physical Therapy [address] [place], [place], [address] Phone: [phone]   Fax:  [phone]  Name: Cheyenne Graham MRN: [id number] Date of Birth: 05/04/1966

## 2021-03-30 ENCOUNTER — Ambulatory Visit (INDEPENDENT_AMBULATORY_CARE_PROVIDER_SITE_OTHER): Payer: Federal, State, Local not specified - PPO | Admitting: Rehabilitative and Restorative Service Providers"

## 2021-03-30 ENCOUNTER — Encounter: Payer: Self-pay | Admitting: Rehabilitative and Restorative Service Providers"

## 2021-03-30 ENCOUNTER — Other Ambulatory Visit: Payer: Self-pay

## 2021-03-30 DIAGNOSIS — R600 Localized edema: Secondary | ICD-10-CM

## 2021-03-30 DIAGNOSIS — M25662 Stiffness of left knee, not elsewhere classified: Secondary | ICD-10-CM

## 2021-03-30 DIAGNOSIS — R262 Difficulty in walking, not elsewhere classified: Secondary | ICD-10-CM

## 2021-03-30 DIAGNOSIS — M25562 Pain in left knee: Secondary | ICD-10-CM

## 2021-03-30 DIAGNOSIS — M6281 Muscle weakness (generalized): Secondary | ICD-10-CM | POA: Diagnosis not present

## 2021-03-30 NOTE — Therapy (Signed)
[facility] Physical Therapy [address] [place], [place], [address] Phone: [phone]   Fax:  [phone]  Physical Therapy Treatment  Patient Details  Name: Cheyenne Graham MRN: [id number] Date of Birth: 14-Jun-1966 Referring Provider (PT): Cheyenne Rossetti MD   Encounter Date: 03/30/2021   PT End of Session - 03/30/21 0855     Visit Number 13    Number of Visits 24    Date for PT Re-Evaluation 04/09/21    Progress Note Due on Visit 17    PT Start Time 0845    PT Stop Time 0924    PT Time Calculation (min) 39 min    Activity Tolerance Patient tolerated treatment well   Rt knee pain   Behavior During Therapy [facility] for tasks assessed/performed             Past Medical History:  Diagnosis Date   Arthritis    Constipation    Family history of anesthesia complication    ?  Aunt did not wake up.  [name] not sure where she had surgery,l   GERD (gastroesophageal reflux disease)    decreased since she is not taking advail   Herpes simplex    Hypertension    in the history, resolved after diet changes   Migraine     2 a week   Pneumonia        PONV (postoperative nausea and vomiting)     Past Surgical History:  Procedure Laterality Date   Fatty tumor     KNEE ARTHROSCOPY Right    KNEE SURGERY  02/22/2012   KNEE SURGERY Right 06/18/2014   TOTAL KNEE ARTHROPLASTY  02/22/2012   Procedure: TOTAL KNEE ARTHROPLASTY;  Surgeon: Cheyenne Balding, MD;  Location: [place];  Service: Orthopedics;  Laterality: Right;   TOTAL KNEE ARTHROPLASTY Left 01/27/2021   Procedure: LEFT TOTAL KNEE ARTHROPLASTY;  Surgeon: Cheyenne Rossetti, MD;  Location: [place];  Service: Orthopedics;  Laterality: Left;   TOTAL KNEE REVISION Right 06/18/2014   Procedure: RIGHT TOTAL KNEE REVISION;  Surgeon: Cheyenne Balding, MD;  Location: [place];  Service: Orthopedics;  Laterality: Right;   TUBAL LIGATION      There were no vitals filed for this visit.   Subjective  Assessment - 03/30/21 0851     Subjective She indicated that Lt knee was "irritated" but not necessary painful.  Reported at 4/10.  Pt. stated Rt knee was improved some over the weekend but still noticed.    Pertinent History R TKA (X2) and HTN    Limitations Sitting;House hold activities;Standing;Walking    Patient Stated Goals Improve comfort with standing and walking    Currently in Pain? Yes    Pain Score 4     Pain Location Knee    Pain Orientation Left    Pain Descriptors / Indicators Other (Comment)   irritated   Pain Type Chronic pain;Surgical pain    Pain Onset More than a month ago    Pain Frequency Intermittent    Aggravating Factors  pressure in WB due to getting off Rt knee    Pain Relieving Factors rest                               [facility] Adult PT Treatment/Exercise - 03/30/21 0001       Neuro Re-ed    Neuro Re-ed Details  Lt SLS c Rt slider anterior/latera/posterior x 10  Exercises   Exercises Other Exercises    Other Exercises  Elected for verbal review of existing HEP with Pt. agreement.      Knee/Hip Exercises: Aerobic   Nustep Lvl 6 10 mins UE/LE      Knee/Hip Exercises: Standing   Forward Step Up Step Height: 6";2 sets;10 reps;Left   green TKE 2 sec hold on step     Manual Therapy   Manual therapy comments seated Lt knee distraction c flexion, IR mobilization c movement c contralateral leg movement opposite.  contract/relax techniques for Lt quad                      PT Short Term Goals - 03/06/21 1734       PT SHORT TERM GOAL #1   Title Improve L knee AROM to -3 to 90 degrees.    Baseline -2 to 92 degrees (was -5 to 64 degrees at evaluation)    Time 4    Period Weeks    Status Achieved    Target Date 03/12/21               PT Long Term Goals - 03/27/21 1005       PT LONG TERM GOAL #1   Title Improve FOTO to 43.    Baseline 40 (was 15)    Time 8    Period Weeks    Status On-going    Target Date  04/09/21      PT LONG TERM GOAL #2   Title Improve L knee AROM to 0-105 degrees.    Baseline -2 to 98 degrees    Time 8    Period Weeks    Status On-going    Target Date 04/09/21      PT LONG TERM GOAL #3   Title Improve B quadriceps strength as assessed by MMT and functional scores.    Baseline Needs more range to get to 3/5 or better    Time 8    Period Weeks    Status On-going    Target Date 04/09/21      PT LONG TERM GOAL #4   Title Cheyenne Graham will be independent with her long-term HEP at [place].    Time 8    Period Weeks    Status On-going    Target Date 04/09/21                   Plan - 03/30/21 0912     Clinical Impression Statement Pt. indicated she was comfortable with standing and Lt leg strengthening resumption with continued focus on avoiding exacerbation of Rt knee symptoms.  Performed verbal review of existing HEP c good knowledge noted from Pt. Pt. to continue to benefit from Lt leg strengthening in NWB, WB activity as well as continued inclusion of flexion progression for mobility.  Pt. deferred vaso today.    Personal Factors and Comorbidities Comorbidity 2    Comorbidities R TKA (X2) and HTN    Examination-Activity Limitations Bathing;Dressing;Transfers;Bed Mobility;Sleep;Squat;Lift;Bend;Locomotion Level;Stairs;Stand;Carry    Examination-Participation Restrictions Interpersonal Relationship;Occupation;Yard Work;Laundry;Cleaning;Community Activity;Driving    Stability/Clinical Decision Making Stable/Uncomplicated    Rehab Potential Good    PT Frequency 3x / week    PT Duration 2 weeks    PT Treatment/Interventions ADLs/Self Care Home Management;Electrical Stimulation;Cryotherapy;Therapeutic activities;Stair training;Gait training;Therapeutic exercise;Balance training;Neuromuscular re-education;Patient/family education;Manual techniques;Passive range of motion;Vasopneumatic Device    PT Next Visit Plan Progressive WB, NWB strengthening and functional movement  improvements with mindfulness of Rt  knee    PT Home Exercise Plan Access Code: [email or web address]    Consulted and Agree with Plan of Care Patient             Patient will benefit from skilled therapeutic intervention in order to improve the following deficits and impairments:  Abnormal gait, Decreased activity tolerance, Decreased endurance, Decreased range of motion, Decreased strength, Difficulty walking, Increased edema, Impaired flexibility, Pain  Visit Diagnosis: Difficulty walking  Muscle weakness (generalized)  Localized edema  Stiffness of left knee, not elsewhere classified  Acute pain of left knee     Problem List Patient Active Problem List   Diagnosis Date Noted   Status post total left knee replacement 01/27/2021   Unilateral primary osteoarthritis, left knee 11/27/2020   Primary osteoarthritis of both hands 11/17/2016   Chronic pain of right knee 10/03/2016   History of hypertension 10/03/2016   History of migraine 10/03/2016   Gastroesophageal reflux disease without esophagitis 10/03/2016   History of herpes simplex infection 10/03/2016   History of total knee replacement, right 06/18/2014   Other chronic postoperative pain 01/25/2014   Osteoarthritis of left knee 01/25/2014   Postoperative anemia due to acute blood loss 02/24/2012   Obesity (BMI 30-39.9) 02/24/2012    Scot Jun, PT, DPT, OCS, ATC 03/30/21  9:24 AM    [facility] [address] [place], [place], [address] Phone: [phone]   Fax:  [phone]  Name: Cheyenne Graham MRN: [id number] Date of Birth: [birth year]

## 2021-04-01 ENCOUNTER — Ambulatory Visit (INDEPENDENT_AMBULATORY_CARE_PROVIDER_SITE_OTHER): Payer: Federal, State, Local not specified - PPO | Admitting: Rehabilitative and Restorative Service Providers"

## 2021-04-01 ENCOUNTER — Other Ambulatory Visit: Payer: Self-pay

## 2021-04-01 ENCOUNTER — Encounter: Payer: Self-pay | Admitting: Rehabilitative and Restorative Service Providers"

## 2021-04-01 DIAGNOSIS — M25662 Stiffness of left knee, not elsewhere classified: Secondary | ICD-10-CM | POA: Diagnosis not present

## 2021-04-01 DIAGNOSIS — M25562 Pain in left knee: Secondary | ICD-10-CM

## 2021-04-01 DIAGNOSIS — R262 Difficulty in walking, not elsewhere classified: Secondary | ICD-10-CM | POA: Diagnosis not present

## 2021-04-01 DIAGNOSIS — M6281 Muscle weakness (generalized): Secondary | ICD-10-CM

## 2021-04-01 DIAGNOSIS — R600 Localized edema: Secondary | ICD-10-CM

## 2021-04-01 NOTE — Therapy (Signed)
[facility] Physical Therapy [address] [place], [place], [address] Phone: [phone]   Fax:  [phone]  Physical Therapy Treatment  Patient Details  Name: Cheyenne Graham MRN: [id number] Date of Birth: 22-Jun-1966 Referring Provider (PT): Mcarthur Rossetti MD   Encounter Date: 04/01/2021   PT End of Session - 04/01/21 1017     Visit Number 14    Number of Visits 24    Date for PT Re-Evaluation 04/09/21    Progress Note Due on Visit 17    PT Start Time 1016    PT Stop Time 1056    PT Time Calculation (min) 40 min    Activity Tolerance Patient tolerated treatment well   Rt knee pain   Behavior During Therapy [facility] for tasks assessed/performed             Past Medical History:  Diagnosis Date   Arthritis    Constipation    Family history of anesthesia complication    ?  Aunt did not wake up.  Ms [place] not sure where she had surgery,l   GERD (gastroesophageal reflux disease)    decreased since she is not taking advail   Herpes simplex    Hypertension    in the history, resolved after diet changes   Migraine     2 a week   Pneumonia        PONV (postoperative nausea and vomiting)     Past Surgical History:  Procedure Laterality Date   Fatty tumor     KNEE ARTHROSCOPY Right    KNEE SURGERY  02/22/2012   KNEE SURGERY Right 06/18/2014   TOTAL KNEE ARTHROPLASTY  02/22/2012   Procedure: TOTAL KNEE ARTHROPLASTY;  Surgeon: Garald Balding, MD;  Location: [place];  Service: Orthopedics;  Laterality: Right;   TOTAL KNEE ARTHROPLASTY Left 01/27/2021   Procedure: LEFT TOTAL KNEE ARTHROPLASTY;  Surgeon: Mcarthur Rossetti, MD;  Location: [place];  Service: Orthopedics;  Laterality: Left;   TOTAL KNEE REVISION Right 06/18/2014   Procedure: RIGHT TOTAL KNEE REVISION;  Surgeon: Garald Balding, MD;  Location: [place];  Service: Orthopedics;  Laterality: Right;   TUBAL LIGATION      There were no vitals filed for this visit.   Subjective  Assessment - 04/01/21 1021     Subjective Pt. indicated this mornings complaints were around 3.5-4/10 range and took advil to try to help it.  Felt no aggravation of Rt knee after last visit.    Pertinent History R TKA (X2) and HTN    Limitations Sitting;House hold activities;Standing;Walking    Patient Stated Goals Improve comfort with standing and walking    Currently in Pain? Yes    Pain Score 3     Pain Location Knee    Pain Orientation Left    Pain Descriptors / Indicators Sore;Aching    Pain Onset More than a month ago    Pain Frequency Intermittent    Aggravating Factors  extended WB pressure, morning complaints today insidious.    Pain Relieving Factors advil                               [facility] Adult PT Treatment/Exercise - 04/01/21 0001       Knee/Hip Exercises: Stretches   Gastroc Stretch 30 seconds;3 reps;Both   incline board     Knee/Hip Exercises: Aerobic   Nustep Lvl 6 10 mins UE/LE  Knee/Hip Exercises: Machines for Strengthening   Cybex Knee Extension Lt leg only 2 x 10 5 lbs    Total Gym Leg Press Lt leg only 2 sets of 15 for 50# slow eccentrics    Other Machine low load long duration stretching 2 mins between sets      Knee/Hip Exercises: Standing   Forward Step Up 2 sets;15 reps;Left;Step Height: 6"   blue band TKE on step     Manual Therapy   Manual therapy comments seated Lt knee distraction c flexion, IR mobilization                      PT Short Term Goals - 03/06/21 1734       PT SHORT TERM GOAL #1   Title Improve L knee AROM to -3 to 90 degrees.    Baseline -2 to 92 degrees (was -5 to 64 degrees at evaluation)    Time 4    Period Weeks    Status Achieved    Target Date 03/12/21               PT Long Term Goals - 03/27/21 1005       PT LONG TERM GOAL #1   Title Improve FOTO to 43.    Baseline 40 (was 15)    Time 8    Period Weeks    Status On-going    Target Date 04/09/21      PT LONG TERM  GOAL #2   Title Improve L knee AROM to 0-105 degrees.    Baseline -2 to 98 degrees    Time 8    Period Weeks    Status On-going    Target Date 04/09/21      PT LONG TERM GOAL #3   Title Improve B quadriceps strength as assessed by MMT and functional scores.    Baseline Needs more range to get to 3/5 or better    Time 8    Period Weeks    Status On-going    Target Date 04/09/21      PT LONG TERM GOAL #4   Title [place] will be independent with her long-term HEP at [place].    Time 8    Period Weeks    Status On-going    Target Date 04/09/21                   Plan - 04/01/21 1050     Clinical Impression Statement Continued progression in strengthening to Lt quad to improve WB acceptance and overall endurance.  Inclusion of low load long duration stretching in clinic and discussed for home use for flexion gains.    Personal Factors and Comorbidities Comorbidity 2    Comorbidities R TKA (X2) and HTN    Examination-Activity Limitations Bathing;Dressing;Transfers;Bed Mobility;Sleep;Squat;Lift;Bend;Locomotion Level;Stairs;Stand;Carry    Examination-Participation Restrictions Interpersonal Relationship;Occupation;Yard Work;Laundry;Cleaning;Community Activity;Driving    Stability/Clinical Decision Making Stable/Uncomplicated    Rehab Potential Good    PT Frequency 3x / week    PT Duration 2 weeks    PT Treatment/Interventions ADLs/Self Care Home Management;Electrical Stimulation;Cryotherapy;Therapeutic activities;Stair training;Gait training;Therapeutic exercise;Balance training;Neuromuscular re-education;Patient/family education;Manual techniques;Passive range of motion;Vasopneumatic Device    PT Next Visit Plan Progressive WB, NWB strengthening and functional movement improvement.  Balance as able based off Rt knee complaints.    PT Home Exercise Plan Access Code: [email or web address]    Consulted and Agree with Plan of Care Patient  Patient will benefit from skilled  therapeutic intervention in order to improve the following deficits and impairments:  Abnormal gait, Decreased activity tolerance, Decreased endurance, Decreased range of motion, Decreased strength, Difficulty walking, Increased edema, Impaired flexibility, Pain  Visit Diagnosis: Difficulty walking  Muscle weakness (generalized)  Localized edema  Stiffness of left knee, not elsewhere classified  Acute pain of left knee     Problem List Patient Active Problem List   Diagnosis Date Noted   Status post total left knee replacement 01/27/2021   Unilateral primary osteoarthritis, left knee 11/27/2020   Primary osteoarthritis of both hands 11/17/2016   Chronic pain of right knee 10/03/2016   History of hypertension 10/03/2016   History of migraine 10/03/2016   Gastroesophageal reflux disease without esophagitis 10/03/2016   History of herpes simplex infection 10/03/2016   History of total knee replacement, right 06/18/2014   Other chronic postoperative pain 01/25/2014   Osteoarthritis of left knee 01/25/2014   Postoperative anemia due to acute blood loss 02/24/2012   Obesity (BMI 30-39.9) 02/24/2012    Scot Jun, PT, DPT, OCS, ATC 04/01/21  10:55 AM    [facility] Physical Therapy [address] [place], [place], [address] Phone: [phone]   Fax:  [phone]  Name: Cheyenne Graham MRN: [id number] Date of Birth: [birth year]

## 2021-04-03 ENCOUNTER — Other Ambulatory Visit: Payer: Self-pay

## 2021-04-03 ENCOUNTER — Encounter: Payer: Self-pay | Admitting: Rehabilitative and Restorative Service Providers"

## 2021-04-03 ENCOUNTER — Ambulatory Visit (INDEPENDENT_AMBULATORY_CARE_PROVIDER_SITE_OTHER): Payer: Federal, State, Local not specified - PPO | Admitting: Rehabilitative and Restorative Service Providers"

## 2021-04-03 DIAGNOSIS — R262 Difficulty in walking, not elsewhere classified: Secondary | ICD-10-CM

## 2021-04-03 DIAGNOSIS — M25662 Stiffness of left knee, not elsewhere classified: Secondary | ICD-10-CM | POA: Diagnosis not present

## 2021-04-03 DIAGNOSIS — M6281 Muscle weakness (generalized): Secondary | ICD-10-CM | POA: Diagnosis not present

## 2021-04-03 DIAGNOSIS — R600 Localized edema: Secondary | ICD-10-CM | POA: Diagnosis not present

## 2021-04-03 NOTE — Patient Instructions (Signed)
Continue emphasis on quadriceps strength and knee flexion AROM

## 2021-04-03 NOTE — Therapy (Addendum)
[facility] [address] [place], [place], [address] Phone: [phone]   Fax:  [phone]  Physical Therapy Treatment/Reassessment  Patient Details  Name: KAYCE LUECK MRN: [id number] Date of Birth: 02-08-1966 Referring Provider (PT): Mcarthur Rossetti MD   Encounter Date: 04/03/2021   PT End of Session - 04/03/21 1631     Visit Number 15    Number of Visits 24    Date for PT Re-Evaluation 04/09/21    Progress Note Due on Visit 25    PT Start Time 0852    PT Stop Time 0942    PT Time Calculation (min) 50 min    Activity Tolerance Patient tolerated treatment well   Rt knee pain   Behavior During Therapy [facility] for tasks assessed/performed             Past Medical History:  Diagnosis Date   Arthritis    Constipation    Family history of anesthesia complication    ?  Aunt did not wake up.  Ms Guiler not sure where she had surgery,l   GERD (gastroesophageal reflux disease)    decreased since she is not taking advail   Herpes simplex    Hypertension    in the history, resolved after diet changes   Migraine     2 a week   Pneumonia        PONV (postoperative nausea and vomiting)     Past Surgical History:  Procedure Laterality Date   Fatty tumor     KNEE ARTHROSCOPY Right    KNEE SURGERY  02/22/2012   KNEE SURGERY Right 06/18/2014   TOTAL KNEE ARTHROPLASTY  02/22/2012   Procedure: TOTAL KNEE ARTHROPLASTY;  Surgeon: Garald Balding, MD;  Location: [place];  Service: Orthopedics;  Laterality: Right;   TOTAL KNEE ARTHROPLASTY Left 01/27/2021   Procedure: LEFT TOTAL KNEE ARTHROPLASTY;  Surgeon: Mcarthur Rossetti, MD;  Location: [place];  Service: Orthopedics;  Laterality: Left;   TOTAL KNEE REVISION Right 06/18/2014   Procedure: RIGHT TOTAL KNEE REVISION;  Surgeon: Garald Balding, MD;  Location: [place];  Service: Orthopedics;  Laterality: Right;   TUBAL LIGATION      There were no vitals filed for this visit.    Subjective Assessment - 04/03/21 0855     Subjective Shacara is taking Advil but it is "taking a toll" on her stomach.  She plans on talking with Dr. Ninfa Linden when she sees him 04/13/2021.    Pertinent History R TKA (X2) and HTN    Limitations Sitting;House hold activities;Standing;Walking    How long can you sit comfortably? 1-1.5 hours (was 30 minutes)    How long can you stand comfortably? 1+ hours (was 15 minutes)    How long can you walk comfortably? 15 minutes (was 10 minutes) R knee limited    Patient Stated Goals Improve comfort with standing and walking    Currently in Pain? Yes    Pain Score 4     Pain Location Knee    Pain Orientation Left    Pain Descriptors / Indicators Sore;Aching    Pain Type Chronic pain;Surgical pain    Pain Radiating Towards NA    Pain Onset More than a month ago    Pain Frequency Intermittent    Aggravating Factors  Too much WB, R knee is most limited    Pain Relieving Factors Advil    Effect of Pain on Daily Activities Limited WB endurance.  Out of  work full-time at the [employer].    Multiple Pain Sites No                [facility] PT Assessment - 04/03/21 0001       AROM   Left Knee Extension -3    Left Knee Flexion 95   Edema                          [facility] Adult PT Treatment/Exercise - 04/03/21 0001       Exercises   Exercises Knee/Hip      Knee/Hip Exercises: Aerobic   Nustep Level 6 for 8 minutes      Knee/Hip Exercises: Seated   Knee/Hip Flexion Seated knee flexion with R leg overpressure 2 sets of 10X 10 seconds    Other Seated Knee/Hip Exercises Seated straight leg raises 5 sets of 5 (toes back, tighten thigh, pause 2 seconds, lift heel 6-8 inches, slowly lower, repeat)      Knee/Hip Exercises: Supine   Quad Sets Strengthening;Both;2 sets;20 reps;Limitations    Quad Sets Limitations 5 seconds (toes back, press down and tighten thigh)      Vasopneumatic   Number Minutes Vasopneumatic  10 minutes     Vasopnuematic Location  Knee    Vasopneumatic Pressure Medium    Vasopneumatic Temperature  34                    PT Education - 04/03/21 [id number]     Education Details Reviewed HEP with focus on NWB quadriceps strength to reduce R knee pain and L knee edema for improved flexion AROM and WB function.    Person(s) Educated Patient    Methods Explanation;Demonstration;Tactile cues;Verbal cues    Comprehension Verbal cues required;Returned demonstration;Need further instruction;Tactile cues required;Verbalized understanding              PT Short Term Goals - 03/06/21 1734       PT SHORT TERM GOAL #1   Title Improve L knee AROM to -3 to 90 degrees.    Baseline -2 to 92 degrees (was -5 to 64 degrees at evaluation)    Time 4    Period Weeks    Status Achieved    Target Date 03/12/21               PT Long Term Goals - 04/03/21 0938       PT LONG TERM GOAL #1   Title Improve FOTO to 43.    Baseline 55 (was 15)    Time 8    Period Weeks    Status Achieved      PT LONG TERM GOAL #2   Title Improve L knee AROM to 0-105 degrees.    Baseline -3 to 95 degrees (has been -2 to 98 but more edema today)    Time 5    Period Weeks    Status On-going    Target Date 05/08/21      PT LONG TERM GOAL #3   Title Improve B quadriceps strength as assessed by MMT and functional scores.    Baseline Needs more range to get to 3/5 or better    Time 5    Period Weeks    Status On-going    Target Date 05/08/21      PT LONG TERM GOAL #4   Title Maicey will be independent with her long-term HEP at DC.    Time 5  Period Weeks    Status On-going    Target Date 05/08/21                   Plan - 04/03/21 1632     Clinical Impression Statement Alexandrya had more edema today as she had been "running errands" and has been spending more time weight-bearing resulting in increased edema.  AROM was a bit more limited today.  Extension AROM (-2 to -3 degrees) is getting close to  normal while flexion AROM (95-98 degrees) will benefit from continued work, as will quadriceps strength and weight-bearing function.    Personal Factors and Comorbidities Comorbidity 2    Comorbidities R TKA (X2) and HTN    Examination-Activity Limitations Bathing;Dressing;Transfers;Bed Mobility;Sleep;Squat;Lift;Bend;Locomotion Level;Stairs;Stand;Carry    Examination-Participation Restrictions Interpersonal Relationship;Occupation;Yard Work;Laundry;Cleaning;Community Activity;Driving    Stability/Clinical Decision Making Stable/Uncomplicated    Rehab Potential Good    PT Frequency 3x / week    PT Duration 2 weeks   5 weeks   PT Treatment/Interventions ADLs/Self Care Home Management;Electrical Stimulation;Cryotherapy;Therapeutic activities;Stair training;Gait training;Therapeutic exercise;Balance training;Neuromuscular re-education;Patient/family education;Manual techniques;Passive range of motion;Vasopneumatic Device    PT Next Visit Plan Quadriceps strengthening and aggressive knee flexion AROM    PT Home Exercise Plan Access Code: [email or web address]    Consulted and Agree with Plan of Care Patient             Patient will benefit from skilled therapeutic intervention in order to improve the following deficits and impairments:  Abnormal gait, Decreased activity tolerance, Decreased endurance, Decreased range of motion, Decreased strength, Difficulty walking, Increased edema, Impaired flexibility, Pain  Visit Diagnosis: Difficulty walking  Muscle weakness (generalized)  Localized edema  Stiffness of left knee, not elsewhere classified     Problem List Patient Active Problem List   Diagnosis Date Noted   Status post total left knee replacement 01/27/2021   Unilateral primary osteoarthritis, left knee 11/27/2020   Primary osteoarthritis of both hands 11/17/2016   Chronic pain of right knee 10/03/2016   History of hypertension 10/03/2016   History of migraine 10/03/2016    Gastroesophageal reflux disease without esophagitis 10/03/2016   History of herpes simplex infection 10/03/2016   History of total knee replacement, right 06/18/2014   Other chronic postoperative pain 01/25/2014   Osteoarthritis of left knee 01/25/2014   Postoperative anemia due to acute blood loss 02/24/2012   Obesity (BMI 30-39.9) 02/24/2012    Farley Ly PT, MPT 04/03/2021, 4:38 PM  [facility] [address] [place], [place], [address] Phone: [phone]   Fax:  [phone]  Name: LUCA SANANGELO MRN: [id number] Date of Birth: 1966/03/16

## 2021-04-10 ENCOUNTER — Other Ambulatory Visit: Payer: Self-pay

## 2021-04-10 ENCOUNTER — Encounter: Payer: Self-pay | Admitting: Rehabilitative and Restorative Service Providers"

## 2021-04-10 ENCOUNTER — Ambulatory Visit (INDEPENDENT_AMBULATORY_CARE_PROVIDER_SITE_OTHER): Payer: Federal, State, Local not specified - PPO | Admitting: Rehabilitative and Restorative Service Providers"

## 2021-04-10 DIAGNOSIS — R262 Difficulty in walking, not elsewhere classified: Secondary | ICD-10-CM | POA: Diagnosis not present

## 2021-04-10 DIAGNOSIS — M25662 Stiffness of left knee, not elsewhere classified: Secondary | ICD-10-CM | POA: Diagnosis not present

## 2021-04-10 DIAGNOSIS — M25562 Pain in left knee: Secondary | ICD-10-CM

## 2021-04-10 DIAGNOSIS — M6281 Muscle weakness (generalized): Secondary | ICD-10-CM

## 2021-04-10 DIAGNOSIS — R600 Localized edema: Secondary | ICD-10-CM

## 2021-04-10 NOTE — Addendum Note (Signed)
Addended by: Maricela Bo on: 04/10/2021 04:48 PM   Modules accepted: Orders

## 2021-04-10 NOTE — Addendum Note (Signed)
Addended by: Maricela Bo on: 04/10/2021 04:55 PM   Modules accepted: Orders

## 2021-04-10 NOTE — Therapy (Addendum)
[facility] Physical Therapy [address] [place], [place], [address] Phone: [phone]   Fax:  [phone]  Physical Therapy Treatment/Discharge  Patient Details  Name: Cheyenne Graham MRN: [id number] Date of Birth: October 29, 1965 Referring Provider (PT): Mcarthur Rossetti MD   Encounter Date: 04/10/2021   PT End of Session - 04/10/21 0929     Visit Number 16    Number of Visits 25    Date for PT Re-Evaluation 05/08/21    Progress Note Due on Visit 25    PT Start Time 0929    PT Stop Time 1019    PT Time Calculation (min) 50 min    Activity Tolerance Patient tolerated treatment well   Rt knee pain   Behavior During Therapy [facility] for tasks assessed/performed             Past Medical History:  Diagnosis Date   Arthritis    Constipation    Family history of anesthesia complication    ?  Aunt did not wake up.  Cheyenne Graham not sure where she had surgery,l   GERD (gastroesophageal reflux disease)    decreased since she is not taking advail   Herpes simplex    Hypertension    in the history, resolved after diet changes   Migraine     2 a week   Pneumonia        PONV (postoperative nausea and vomiting)     Past Surgical History:  Procedure Laterality Date   Fatty tumor     KNEE ARTHROSCOPY Right    KNEE SURGERY  02/22/2012   KNEE SURGERY Right 06/18/2014   TOTAL KNEE ARTHROPLASTY  02/22/2012   Procedure: TOTAL KNEE ARTHROPLASTY;  Surgeon: Garald Balding, MD;  Location: [place];  Service: Orthopedics;  Laterality: Right;   TOTAL KNEE ARTHROPLASTY Left 01/27/2021   Procedure: LEFT TOTAL KNEE ARTHROPLASTY;  Surgeon: Mcarthur Rossetti, MD;  Location: [place];  Service: Orthopedics;  Laterality: Left;   TOTAL KNEE REVISION Right 06/18/2014   Procedure: RIGHT TOTAL KNEE REVISION;  Surgeon: Garald Balding, MD;  Location: [place];  Service: Orthopedics;  Laterality: Right;   TUBAL LIGATION      There were no vitals filed for this visit.    Subjective Assessment - 04/10/21 0936     Subjective Pt. indicated Rt leg continued to be the main trouble concern at this time.  Pt. indicated Lt leg is getting better overall.    Pertinent History R TKA (X2) and HTN    Limitations Sitting;House hold activities;Standing;Walking    Patient Stated Goals Improve comfort with standing and walking    Currently in Pain? Yes    Pain Score 2     Pain Location Knee    Pain Orientation Left    Pain Descriptors / Indicators Sore    Pain Type Chronic pain;Surgical pain    Pain Onset More than a month ago    Pain Frequency Intermittent    Aggravating Factors  WB activity    Pain Relieving Factors general exercise improvements, medicine                               [facility] Adult PT Treatment/Exercise - 04/10/21 0001       Knee/Hip Exercises: Aerobic   Nustep Lvl 6 10 mins UE/LE      Knee/Hip Exercises: Machines for Strengthening   Cybex Knee Extension Lt leg only 3  x 10 5 lbs c 1 min flexion stretch on machine after each set    Total Gym Leg Press Lt leg only 2 sets of 15 for 50# slow eccentrics      Knee/Hip Exercises: Seated   Other Seated Knee/Hip Exercises seated SLR 2 x 10 Lt leg      Vasopneumatic   Number Minutes Vasopneumatic  10 minutes    Vasopnuematic Location  Knee    Vasopneumatic Pressure Medium    Vasopneumatic Temperature  34      Manual Therapy   Manual therapy comments seated Lt knee distraction c flexion, IR mobilization                      PT Short Term Goals - 03/06/21 1734       PT SHORT TERM GOAL #1   Title Improve L knee AROM to -3 to 90 degrees.    Baseline -2 to 92 degrees (was -5 to 64 degrees at evaluation)    Time 4    Period Weeks    Status Achieved    Target Date 03/12/21               PT Long Term Goals - 04/03/21 0938       PT LONG TERM GOAL #1   Title Improve FOTO to 43.    Baseline 55 (was 15)    Time 8    Period Weeks    Status Achieved       PT LONG TERM GOAL #2   Title Improve L knee AROM to 0-105 degrees.    Baseline -3 to 95 degrees (has been -2 to 98 but more edema today)    Time 5    Period Weeks    Status On-going    Target Date 05/08/21      PT LONG TERM GOAL #3   Title Improve B quadriceps strength as assessed by MMT and functional scores.    Baseline Needs more range to get to 3/5 or better    Time 5    Period Weeks    Status On-going    Target Date 05/08/21      PT LONG TERM GOAL #4   Title Cheyenne Graham will be independent with her long-term HEP at [place].    Time 5    Period Weeks    Status On-going    Target Date 05/08/21                   Plan - 04/10/21 0940     Clinical Impression Statement Discussion c Pt. today about future treatment planning.  Importance to incorporate interventions that are not available to Pt. at home to help aide in promotion of strengthening and mobility overall as Pt. has demonstrated good knowledge of HEP.  Pt. expressed this desire.  Rt leg continued to show as more functional limitation at this time.    Personal Factors and Comorbidities Comorbidity 2    Comorbidities R TKA (X2) and HTN    Examination-Activity Limitations Bathing;Dressing;Transfers;Bed Mobility;Sleep;Squat;Lift;Bend;Locomotion Level;Stairs;Stand;Carry    Examination-Participation Restrictions Interpersonal Relationship;Occupation;Yard Work;Laundry;Cleaning;Community Activity;Driving    Stability/Clinical Decision Making Stable/Uncomplicated    Clinical Decision Making Low    Rehab Potential Good    PT Frequency 2x / week    PT Duration Other (comment)   5 weeks   PT Treatment/Interventions ADLs/Self Care Home Management;Electrical Stimulation;Cryotherapy;Therapeutic activities;Stair training;Gait training;Therapeutic exercise;Balance training;Neuromuscular re-education;Patient/family education;Manual techniques;Passive range of motion;Vasopneumatic Device  PT Next Visit Plan Return to MD.  Continue to focus  on strengthening (weighted resistance for Lt leg as well as techniques manual and ther ex for flexion gains)    PT Home Exercise Plan Access Code: [email or web address]    Consulted and Agree with Plan of Care Patient             Patient will benefit from skilled therapeutic intervention in order to improve the following deficits and impairments:  Abnormal gait, Decreased activity tolerance, Decreased endurance, Decreased range of motion, Decreased strength, Difficulty walking, Increased edema, Impaired flexibility, Pain  Visit Diagnosis: Difficulty walking  Muscle weakness (generalized)  Localized edema  Stiffness of left knee, not elsewhere classified  Acute pain of left knee     Problem List Patient Active Problem List   Diagnosis Date Noted   Status post total left knee replacement 01/27/2021   Unilateral primary osteoarthritis, left knee 11/27/2020   Primary osteoarthritis of both hands 11/17/2016   Chronic pain of right knee 10/03/2016   History of hypertension 10/03/2016   History of migraine 10/03/2016   Gastroesophageal reflux disease without esophagitis 10/03/2016   History of herpes simplex infection 10/03/2016   History of total knee replacement, right 06/18/2014   Other chronic postoperative pain 01/25/2014   Osteoarthritis of left knee 01/25/2014   Postoperative anemia due to acute blood loss 02/24/2012   Obesity (BMI 30-39.9) 02/24/2012    Scot Jun, PT, DPT, OCS, ATC 04/10/21  10:06 AM  PHYSICAL THERAPY DISCHARGE SUMMARY  Visits from Start of Care: 16  Current functional level related to goals / functional outcomes: See note   Remaining deficits: See note   Education / Equipment: HEP   Patient agrees to discharge. Patient goals were partially met. Patient is being discharged due to not returning since the last visit.  Scot Jun, PT, DPT, OCS, ATC 06/03/21  3:43 PM     [facility] [address] [place], [place], [address] Phone: [phone]   Fax:  [phone]  Name: Cheyenne Graham MRN: [id number] Date of Birth: 09/11/1965

## 2021-04-13 ENCOUNTER — Ambulatory Visit: Payer: Self-pay

## 2021-04-13 ENCOUNTER — Other Ambulatory Visit: Payer: Self-pay

## 2021-04-13 ENCOUNTER — Ambulatory Visit (INDEPENDENT_AMBULATORY_CARE_PROVIDER_SITE_OTHER): Payer: Federal, State, Local not specified - PPO | Admitting: Orthopaedic Surgery

## 2021-04-13 ENCOUNTER — Encounter: Payer: Self-pay | Admitting: Orthopaedic Surgery

## 2021-04-13 DIAGNOSIS — Z96652 Presence of left artificial knee joint: Secondary | ICD-10-CM

## 2021-04-13 DIAGNOSIS — G8929 Other chronic pain: Secondary | ICD-10-CM

## 2021-04-13 DIAGNOSIS — Z96651 Presence of right artificial knee joint: Secondary | ICD-10-CM

## 2021-04-13 DIAGNOSIS — M25561 Pain in right knee: Secondary | ICD-10-CM

## 2021-04-13 MED ORDER — HYDROCODONE-ACETAMINOPHEN 5-325 MG PO TABS: 1.0000 | ORAL_TABLET | Freq: Four times a day (QID) | ORAL | 0 refills | Status: AC | PRN

## 2021-04-13 NOTE — Progress Notes (Signed)
The patient comes in today at 2 and half months status post a left total knee arthroplasty.  She said the left knee has done very well for her.  She has remote history of a right total knee replacement and then a revision of the replacement.  This was done by one of my colleagues.  She has been having more problems with the right knee recently and has been hurting her quite a bit.  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  Her left more recent operative knee has excellent range of motion.  Feels stable on exam.  The right knee has good range of motion as well but there is pain a lot around the medial joint line and the proximal tibia.  No new x-rays were done today.  X-rays from 2019 showed longstem revision components of the right knee.  There are some question of lucency of the proximal tibia.  At this point a three-phase bone scan is warranted to rule out prosthetic loosening of the right knee revision arthroplasty.  We will see her back for follow-up when she has that scan.  At that visit I would like an AP and lateral of her right knee.  It was not done at today's visit.

## 2021-04-14 ENCOUNTER — Encounter: Payer: Federal, State, Local not specified - PPO | Admitting: Rehabilitative and Restorative Service Providers"

## 2021-04-17 ENCOUNTER — Telehealth: Payer: Self-pay | Admitting: Orthopaedic Surgery

## 2021-04-17 NOTE — Telephone Encounter (Signed)
Pt called requesting a out of work note due to surgery. Please call pt when ready for pick up. Pt phone number is [phone].

## 2021-04-17 NOTE — Telephone Encounter (Signed)
Note written patient aware  

## 2021-04-23 ENCOUNTER — Other Ambulatory Visit: Payer: Self-pay

## 2021-04-23 ENCOUNTER — Encounter (HOSPITAL_COMMUNITY)
Admission: RE | Admit: 2021-04-23 | Discharge: 2021-04-23 | Disposition: A | Discharge status: Home / Self Care | Payer: Federal, State, Local not specified - PPO | Source: Ambulatory Visit | Attending: Orthopaedic Surgery | Admitting: Orthopaedic Surgery

## 2021-04-23 DIAGNOSIS — G8929 Other chronic pain: Secondary | ICD-10-CM | POA: Diagnosis present

## 2021-04-23 DIAGNOSIS — M25561 Pain in right knee: Secondary | ICD-10-CM | POA: Diagnosis present

## 2021-04-23 MED ORDER — TECHNETIUM TC 99M MEDRONATE IV KIT
20.0000 | PACK | Freq: Once | INTRAVENOUS | Status: AC | PRN
  Administered 2021-04-23: 20 via INTRAVENOUS

## 2021-04-27 ENCOUNTER — Ambulatory Visit (INDEPENDENT_AMBULATORY_CARE_PROVIDER_SITE_OTHER): Payer: Federal, State, Local not specified - PPO

## 2021-04-27 ENCOUNTER — Ambulatory Visit (INDEPENDENT_AMBULATORY_CARE_PROVIDER_SITE_OTHER): Payer: Federal, State, Local not specified - PPO | Admitting: Orthopaedic Surgery

## 2021-04-27 DIAGNOSIS — Z96651 Presence of right artificial knee joint: Secondary | ICD-10-CM

## 2021-04-27 DIAGNOSIS — M25561 Pain in right knee: Secondary | ICD-10-CM | POA: Diagnosis not present

## 2021-04-27 DIAGNOSIS — Z96652 Presence of left artificial knee joint: Secondary | ICD-10-CM

## 2021-04-27 MED ORDER — ACETAMINOPHEN-CODEINE #3 300-30 MG PO TABS: 1.0000 | ORAL_TABLET | Freq: Three times a day (TID) | ORAL | 0 refills | Status: DC | PRN

## 2021-04-27 NOTE — Progress Notes (Signed)
The patient is now just under 3 months status post a left total knee arthroplasty that we did in May of this year.  She is only 55 years old and we replaced the knee with press-fit implants.  She says that knee is almost asymptomatic to her at this standpoint.  The swelling is getting less and her motion is improving and he has no issues for her.  She has a history of a right total knee arthroplasty that was done many years ago that failed and she ended up having a revision of that done by one of my colleagues in town.  That has longstem revision components.  It has been hurting her quite a bit all along the tibia medial and laterally and both proximal and distal.  We did send her for a three-phase bone scan and it is indicative of loosening of the tibial component of the right knee.  She has pretty good range of motion of the right knee but it does hurt globally around the proximal medial knee joint and distal medial and lateral.  There is a mild effusion as well.  Plain films of her right knee today show longstem components.  There is evidence of loosening of the components.  There also is some evidence of the tibial stem pushing against the lateral cortex of the tibia and the femoral stem pushing into the medial cortex of the femur.  From her more recent left knee standpoint, I do not need to see her back now for 3 months unless there is issues with the left knee and we would obtain a AP and lateral of the left knee at that visit.  From the standpoint of her right knee, I need to refer her to a joint revision specialist because of the complicated nature of this knee and this is nothing like I have treated before in terms of the root vision of the previous revision that is quite complex.  I do not have the level of experience that may be needed for addressing this knee and I would like to see if we can get an opinion with a joint revision specialist to assess her right knee.  I will send in some Tylenol 3  for pain and she will likely need to be out of work for an extended amount of time as she continues to recover from her acute left knee replacement and her chronic right knee issues.

## 2021-04-28 ENCOUNTER — Other Ambulatory Visit: Payer: Self-pay

## 2021-04-28 DIAGNOSIS — Z96651 Presence of right artificial knee joint: Secondary | ICD-10-CM

## 2021-05-20 ENCOUNTER — Telehealth: Payer: Self-pay

## 2021-05-20 NOTE — Telephone Encounter (Signed)
Pt called stating that she just needs a note saying that she is still out of work at this time

## 2021-05-20 NOTE — Telephone Encounter (Signed)
Work note completed and sent to my chart

## 2021-05-20 NOTE — Telephone Encounter (Signed)
Is this ok?

## 2021-05-21 NOTE — Telephone Encounter (Signed)
[facility] has the order, it is in review they will contact pt to schedule once review has been approved.

## 2021-05-21 NOTE — Telephone Encounter (Signed)
I called and talked to the pt. She stated she wants to pick up a copy. This was placed at the front desk. She also wanted to know if we have any updates on her referral... can you please advise

## 2021-06-03 ENCOUNTER — Telehealth: Payer: Self-pay

## 2021-06-03 ENCOUNTER — Other Ambulatory Visit: Payer: Self-pay | Admitting: Orthopaedic Surgery

## 2021-06-03 MED ORDER — ACETAMINOPHEN-CODEINE #3 300-30 MG PO TABS: 1.0000 | ORAL_TABLET | Freq: Three times a day (TID) | ORAL | 0 refills | Status: DC | PRN

## 2021-06-03 NOTE — Telephone Encounter (Signed)
Please advise 

## 2021-06-03 NOTE — Telephone Encounter (Signed)
Pt called and would like a refill on her Tylenol # 3 Please advise

## 2021-06-25 ENCOUNTER — Telehealth: Payer: Self-pay | Admitting: Orthopaedic Surgery

## 2021-06-25 NOTE — Telephone Encounter (Signed)
Patient aware note at front desk  

## 2021-06-25 NOTE — Telephone Encounter (Signed)
Pt would like to come pick up a back to work note.   CB [phone]

## 2021-06-30 ENCOUNTER — Telehealth: Payer: Self-pay | Admitting: Orthopaedic Surgery

## 2021-06-30 ENCOUNTER — Other Ambulatory Visit: Payer: Self-pay | Admitting: Orthopaedic Surgery

## 2021-06-30 MED ORDER — ACETAMINOPHEN-CODEINE #3 300-30 MG PO TABS: 1.0000 | ORAL_TABLET | Freq: Three times a day (TID) | ORAL | 0 refills | Status: DC | PRN

## 2021-06-30 NOTE — Telephone Encounter (Signed)
Work note completed and sent to pt's my chart. I called and advised pt. She said she also needs a refill on her tylenol # 3. Please advise

## 2021-06-30 NOTE — Telephone Encounter (Signed)
Pt called requesting a back to work note with no restrictions. Please call pt about this matter at [phone].

## 2021-06-30 NOTE — Telephone Encounter (Signed)
Is this ok?

## 2021-07-28 ENCOUNTER — Other Ambulatory Visit: Payer: Self-pay

## 2021-07-28 ENCOUNTER — Ambulatory Visit: Payer: Self-pay

## 2021-07-28 ENCOUNTER — Telehealth: Payer: Self-pay

## 2021-07-28 ENCOUNTER — Ambulatory Visit: Payer: Federal, State, Local not specified - PPO | Admitting: Orthopedic Surgery

## 2021-07-28 ENCOUNTER — Ambulatory Visit: Payer: Federal, State, Local not specified - PPO | Admitting: Physician Assistant

## 2021-07-28 ENCOUNTER — Encounter: Payer: Self-pay | Admitting: Orthopaedic Surgery

## 2021-07-28 DIAGNOSIS — Z96652 Presence of left artificial knee joint: Secondary | ICD-10-CM

## 2021-07-28 DIAGNOSIS — Z96651 Presence of right artificial knee joint: Secondary | ICD-10-CM | POA: Diagnosis not present

## 2021-07-28 DIAGNOSIS — M25561 Pain in right knee: Secondary | ICD-10-CM | POA: Diagnosis not present

## 2021-07-28 MED ORDER — ACETAMINOPHEN-CODEINE #3 300-30 MG PO TABS: 1.0000 | ORAL_TABLET | Freq: Three times a day (TID) | ORAL | 0 refills | Status: AC | PRN

## 2021-07-28 NOTE — Progress Notes (Signed)
HPI: [name] returns today for follow-up of her left total knee arthroplasty 01/27/2021.  States left knee is doing well.  Main complaint today is her right knee which she needs to have a revision for loosening.  She has been referred to [facility] but has not heard back from them as of yet.  Review of systems see HPI otherwise negative or noncontributory.  Physical exam: General well-developed well-nourished female is ambulating without any assistive device.   Left knee she has full extension slight hyperextension.  Flexion to proxy 170 degrees.  No instability valgus varus stressing.  Dorsiflexion plantarflexion intact left ankle.   Radiographs: Left knee 2 views: Status post left total knee arthroplasty press-fit components without any evidence of loosening or hardware failure.  No acute fractures.  Right knee 2 views: Shows loosening of the longstem components.  The tibial stem has remained unchanged in position where it is pushing against the lateral cortex.  Femoral stem is appreciated into the medial cortex of the femur which also remains unchanged.  No acute fractures.   Impression: Status post left total knee arthroplasty 01/27/2021 Failed right total knee arthroplasty  Plan: She will continue work on strengthening range of motion left knee.  Follow-up with [place] in May 2023 at that time AP and lateral views of the left knee.  In regards to the right knee we will work on her referral to [place] and contact her with any new updates on her referral.  Questions were encouraged and answered at length.  Refill on Tylenol 3 was given

## 2021-07-28 NOTE — Telephone Encounter (Signed)
Pt came in today and was asking about the status of her referral. Can you please check on this for me?

## 2021-07-29 NOTE — Telephone Encounter (Signed)
General 07/22/2021 11:04 AM Daylene Posey T, CMA - -  Note   Refaxed order to [phone]

## 2021-08-17 ENCOUNTER — Telehealth: Payer: Self-pay | Admitting: Orthopaedic Surgery

## 2021-08-17 NOTE — Telephone Encounter (Signed)
Received vm from pt requesting copy of records. IC,lmvm advised we need records release form. Advised once we receive her Josem Kaufmann, can call then [phone]

## 2021-09-17 ENCOUNTER — Ambulatory Visit: Payer: Federal, State, Local not specified - PPO | Admitting: Orthopaedic Surgery

## 2022-01-25 IMAGING — DX DG KNEE 1-2V PORT*L*
2 series · 2 of 2 positions shown · non-contrast
Comparison: 10/08/2020

CLINICAL DATA: Left knee replacement

EXAM:
PORTABLE LEFT KNEE - 1-2 VIEW

[knee ap]
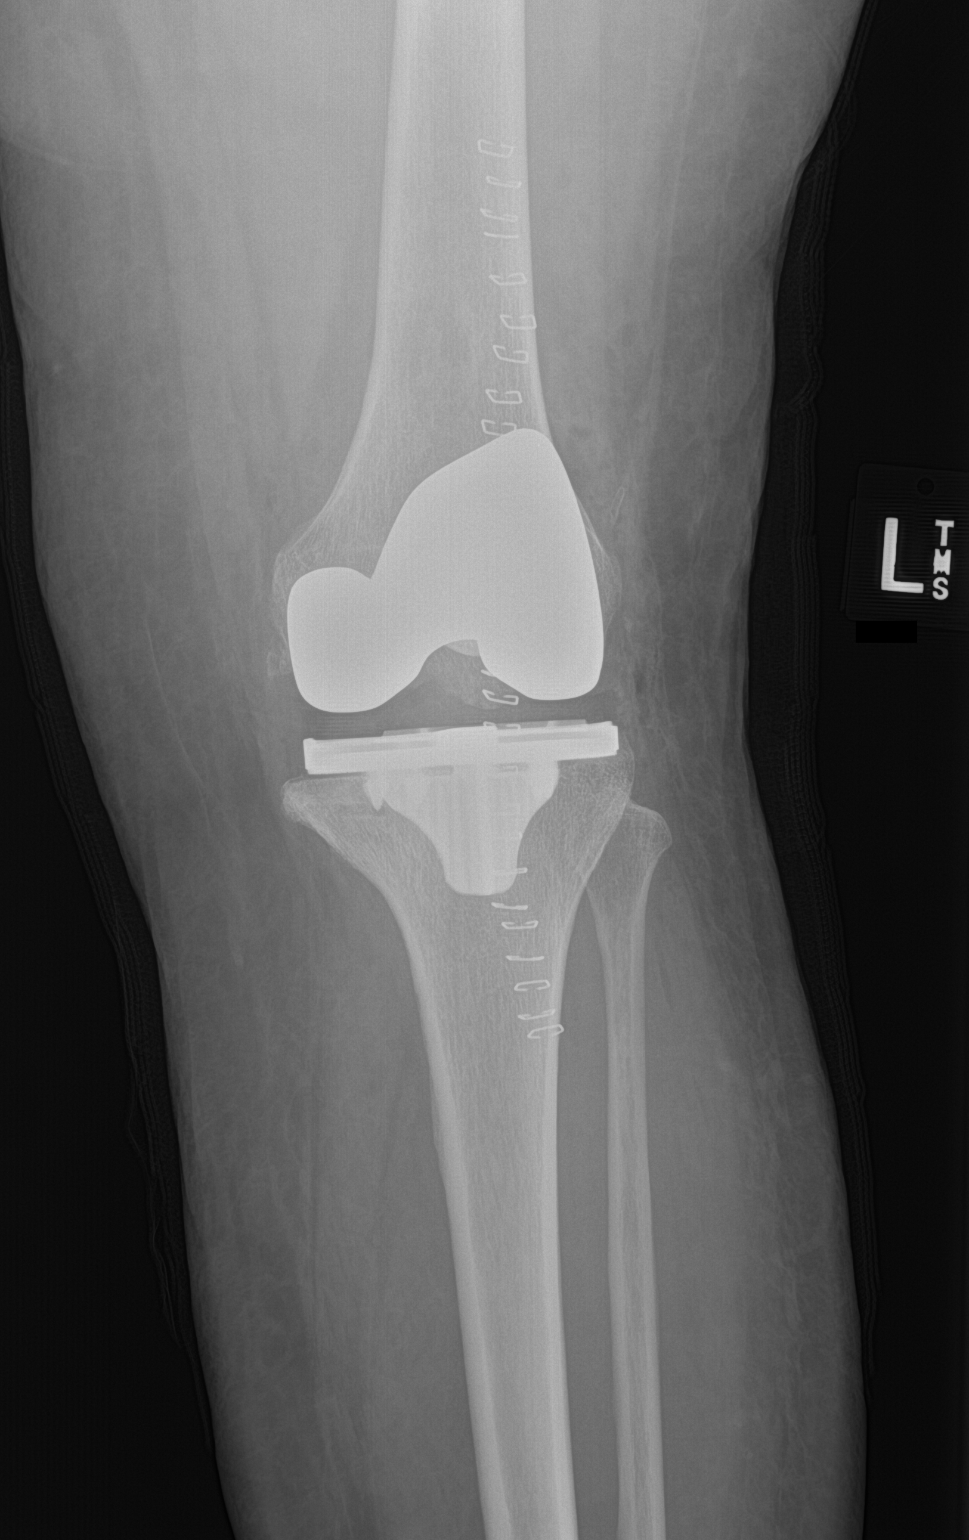

[knee lat]
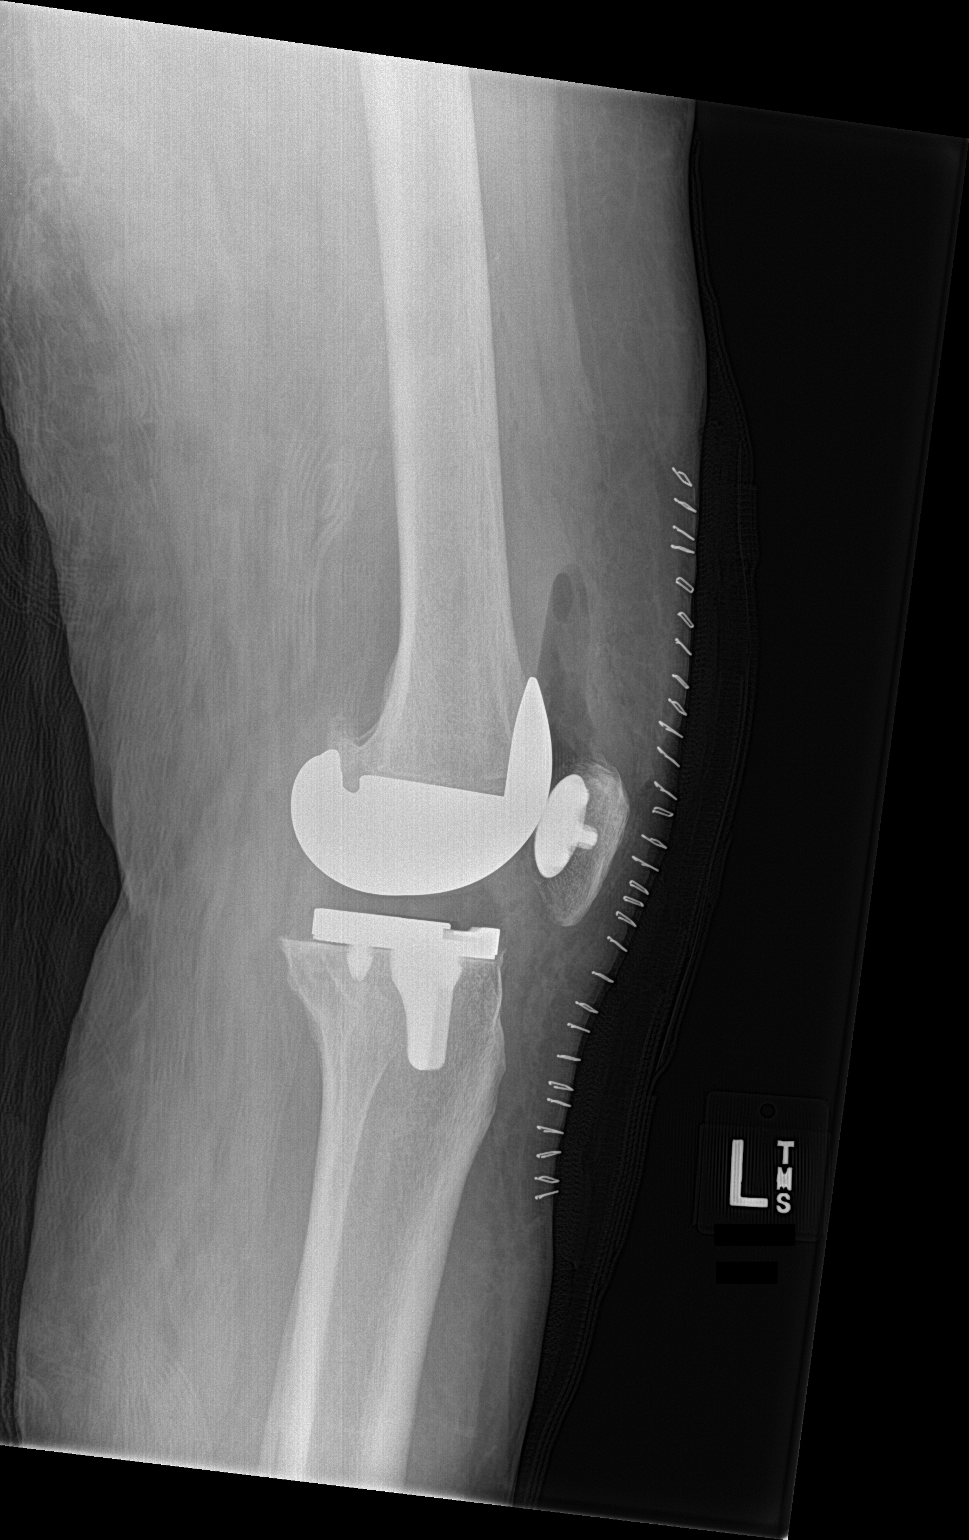

[2 of 2 positions shown; findings below may reference images not displayed]

FINDINGS: Left knee replacement in satisfactory position alignment. Gas and
fluid in the knee joint. No fracture or complication.
IMPRESSION: Satisfactory left knee replacement.

## 2022-02-10 ENCOUNTER — Ambulatory Visit: Payer: Federal, State, Local not specified - PPO | Admitting: Orthopaedic Surgery

## 2022-02-24 ENCOUNTER — Ambulatory Visit: Payer: Federal, State, Local not specified - PPO | Admitting: Orthopaedic Surgery

## 2022-02-24 ENCOUNTER — Encounter: Payer: Self-pay | Admitting: Orthopaedic Surgery

## 2022-02-24 ENCOUNTER — Ambulatory Visit (INDEPENDENT_AMBULATORY_CARE_PROVIDER_SITE_OTHER): Payer: Federal, State, Local not specified - PPO

## 2022-02-24 DIAGNOSIS — Z96652 Presence of left artificial knee joint: Secondary | ICD-10-CM

## 2022-02-24 NOTE — Progress Notes (Signed)
The patient is just over a year out from a left total knee arthroplasty.  This was for severe arthritis of the left knee.  She is only 56 years old.  We did place a press-fit implant in her knee.  She said the knee is swelling minimally but she may have been overcompensating as she has been recovering from right knee revision surgery that was done elsewhere just in April of this year.  She had had a failed revision arthroplasty but it was quite complicated and needed a joint revision specialist.  She is very pleased with what they were able to due to her knee 2 months ago on that right knee.  The left knee that we replaced has excellent range of motion and feels stable.  There is no swelling and no effusion.  2 views of the left knee show well-seated press-fit total knee arthroplasty with no complicating features and no evidence of loosening.  At this point follow-up can be as needed for her knees.  If there is any issues she knows to come see [place] and let us know.  All question concerns were answered and addressed.

## 2023-01-20 ENCOUNTER — Other Ambulatory Visit: Payer: Self-pay | Admitting: Family Medicine

## 2023-01-20 DIAGNOSIS — N939 Abnormal uterine and vaginal bleeding, unspecified: Secondary | ICD-10-CM

## 2023-02-22 ENCOUNTER — Ambulatory Visit
Admission: RE | Admit: 2023-02-22 | Discharge: 2023-02-22 | Disposition: A | Discharge status: Home / Self Care | Payer: Federal, State, Local not specified - PPO | Source: Ambulatory Visit | Attending: Family Medicine | Admitting: Family Medicine

## 2023-02-22 DIAGNOSIS — N939 Abnormal uterine and vaginal bleeding, unspecified: Secondary | ICD-10-CM

## 2024-07-11 ENCOUNTER — Other Ambulatory Visit: Payer: Self-pay

## 2024-07-11 ENCOUNTER — Ambulatory Visit: Admitting: Orthopaedic Surgery

## 2024-07-11 ENCOUNTER — Other Ambulatory Visit (INDEPENDENT_AMBULATORY_CARE_PROVIDER_SITE_OTHER): Payer: Self-pay

## 2024-07-11 ENCOUNTER — Encounter: Payer: Self-pay | Admitting: Orthopaedic Surgery

## 2024-07-11 DIAGNOSIS — G8929 Other chronic pain: Secondary | ICD-10-CM

## 2024-07-11 DIAGNOSIS — M5441 Lumbago with sciatica, right side: Secondary | ICD-10-CM

## 2024-07-11 DIAGNOSIS — Z96652 Presence of left artificial knee joint: Secondary | ICD-10-CM | POA: Diagnosis not present

## 2024-07-11 DIAGNOSIS — Z96651 Presence of right artificial knee joint: Secondary | ICD-10-CM

## 2024-07-11 DIAGNOSIS — Z96653 Presence of artificial knee joint, bilateral: Secondary | ICD-10-CM | POA: Diagnosis not present

## 2024-07-11 NOTE — Progress Notes (Signed)
 The patient is someone we have seen in the past.  We actually replaced her left knee with a press-fit total knee back in 2022.  She does have a history of the right knee revision arthroplasty that failed that was done elsewhere and then she had a revision done of that and she is very pleased with the revision.  However she does get occasional knee pain on the right knee.  She would like to have baseline x-rays again of her knees today and I think that is reasonable.  The reason she comes in the is he has been dealing with low back pain to the right side and sciatica for over a year now.  She has tried all treatment modalities and this is still causing her significant pain that radiates into the hamstring area and down the knee on the right side only.  On exam she is very flexible but does have significant pain to palpation of the lower lumbar spine to the right side.  There is definitely pain on sciatic stretch and a positive straight leg raise on the right side.  Both knees are actually moving pretty well.  The left knee moves better than the right knee but the right knee has had multiple revisions.  X-rays of both knees today show the implants with no complicating features.  2 views of the lumbar spine show degenerative disc disease at L5-S1.  At this point a MRI is warranted lumbar spine to rule out nerve compression given her continued right sided sciatica for well over a year now.  We will see her back in follow-up once we have the MRI of her lumbar spine.  She agrees with this treatment plan.  I did go over all x-rays with her as well.

## 2024-07-12 ENCOUNTER — Other Ambulatory Visit: Payer: Self-pay

## 2024-07-12 DIAGNOSIS — G8929 Other chronic pain: Secondary | ICD-10-CM

## 2024-07-14 ENCOUNTER — Ambulatory Visit
Admission: RE | Admit: 2024-07-14 | Discharge: 2024-07-14 | Disposition: A | Discharge status: Home / Self Care | Source: Ambulatory Visit | Attending: Orthopaedic Surgery | Admitting: Orthopaedic Surgery

## 2024-07-14 DIAGNOSIS — G8929 Other chronic pain: Secondary | ICD-10-CM

## 2024-08-07 ENCOUNTER — Other Ambulatory Visit: Payer: Self-pay

## 2024-08-07 ENCOUNTER — Ambulatory Visit: Admitting: Orthopedic Surgery

## 2024-08-07 DIAGNOSIS — M79641 Pain in right hand: Secondary | ICD-10-CM | POA: Diagnosis not present

## 2024-08-07 DIAGNOSIS — M79642 Pain in left hand: Secondary | ICD-10-CM

## 2024-08-07 NOTE — Progress Notes (Signed)
 x  Cheyenne Graham - 58 y.o. female MRN [id number]  Date of birth: December 16, 1965  Office Visit Note: Visit Date: 08/07/2024 PCP: Austin Mutton, MD Referred by: Austin Mutton, MD  Subjective: No chief complaint on file.  HPI: Cheyenne Graham is a pleasant 58 y.o. female who presents today for evaluation of bilateral hand pain, primarily on the left side.  She does have a history of rheumatoid arthritis, is not currently on any rheumatoid medication.  She does have a history of notable prior thumb MCP, long, ring and small finger PIP fusions as well done years prior at an outside facility.  She is currently having ongoing diffuse pain throughout the left hand, with associated numbness and tingling.  She was seen previously by [facility], underwent workup for potential carpal tunnel syndrome and possible enchondroma in the left ring and long finger.  She was originally scheduled for an MRI, however this has not been completed for the left hand.   Pertinent ROS were reviewed with the patient and found to be negative unless otherwise specified above in HPI.   Visit Reason: bilateral hands Duration of symptoms: years Hand dominance: right Occupation: retired Diabetic: No Smoking: No Heart/Lung History: none Blood Thinners:  none  Prior Testing/EMG: none Injections (Date): none Treatments: none Prior Surgery: [place]; PIP fusions (R) CTR(R) 06/28/22     Assessment & Plan: Visit Diagnoses:  1. Bilateral hand pain     Plan: Extensive discussion was had with the patient today regarding her left hand ongoing complaints.  We did discuss the potential medical treatment of rheumatoid arthritis at this juncture, I will place a referral for rheumatology to evaluate her for potential DMARD medication treatment.  As for the ongoing carpal tunnel syndrome, she has classic signs symptoms compatible with this diagnosis.  I do feel she would be indicated for left carpal tunnel release given her  progressive symptoms with numbness and tingling and clinical examination today.  I am also in agreement that a left hand MRI with contrast would be helpful for evaluation of the possible enchondroma to the left hand.  This will be ordered today, she will obtain the study and return to me for future discussion.  At that juncture we can discuss the results of the MRI and likely move forward with scheduling of the left carpal tunnel release.  I spent 30 minutes in the care of this patient today including review of previous documentation, imaging obtained, face-to-face time discussing all options regarding treatment and documenting the encounter.   Follow-up: No follow-ups on file.   Meds & Orders: No orders of the defined types were placed in this encounter.   Orders Placed This Encounter  Procedures   XR Hand Complete Left   XR Hand Complete Right   MR HAND LEFT W WO CONTRAST   Ambulatory referral to Rheumatology     Procedures: No procedures performed      Clinical History: No specialty comments available.  She reports that she has never smoked. She has never used smokeless tobacco. No results for input(s): HGBA1C, LABURIC in the last 8760 hours.  Objective:   Vital Signs: LMP 09/14/2015 (Approximate) Comment: tubal ligation  Physical Exam  Gen: Well-appearing, in no acute distress; non-toxic CV: Regular Rate. Well-perfused. Warm.  Resp: Breathing unlabored on room air; no wheezing. Psych: Fluid speech in conversation; appropriate affect; normal thought process  Ortho Exam PHYSICAL EXAM:  General: Patient is well appearing and in no distress.  Skin and  Muscle/Range of motion: Right hand with prior well-healed incisional sites to the thumb, long, ring and small finger, range of motion is severely limited secondary to previous fusions  Left hand with notable ulnar deviation at the MCPs, moderate synovitis and swelling, able to perform digital range of motion.  Left wrist  with notable caput ulna, moderate instability at the DRUJ level  Neurologic, Vascular, Motor: Sensation is diminished to light touch in the left median nerve distribution.  Tinel's testing positive left carpal tunnel.  Positive Phalen's left side, positive Durkan's compression.  Fingers pink and well perfused.  Capillary refill is brisk.      No results found for: HGBA1C    Imaging: No results found.  Past Medical/Family/Surgical/Social History: Medications & Allergies reviewed per EMR, new medications updated. Patient Active Problem List   Diagnosis Date Noted   Status post total left knee replacement 01/27/2021   Unilateral primary osteoarthritis, left knee 11/27/2020   Primary osteoarthritis of both hands 11/17/2016   Chronic pain of right knee 10/03/2016   History of hypertension 10/03/2016   History of migraine 10/03/2016   Gastroesophageal reflux disease without esophagitis 10/03/2016   History of herpes simplex infection 10/03/2016   History of total knee replacement, right 06/18/2014   Other chronic postoperative pain 01/25/2014   Osteoarthritis of left knee 01/25/2014   Postoperative anemia due to acute blood loss 02/24/2012   Obesity (BMI 30-39.9) 02/24/2012   Past Medical History:  Diagnosis Date   Arthritis    Constipation    Family history of anesthesia complication    ?  Aunt did not wake up.  Ms Drier not sure where she had surgery,l   GERD (gastroesophageal reflux disease)    decreased since she is not taking advail   Herpes simplex    Hypertension    in the history, resolved after diet changes   Migraine     2 a week   Pneumonia        PONV (postoperative nausea and vomiting)    Family History  Problem Relation Age of Onset   Hypertension Mother    Cancer Mother    Hypertension Father    Asthma Sister    Migraines Sister    Colon cancer Neg Hx    Past Surgical History:  Procedure Laterality Date   Fatty tumor     KNEE ARTHROSCOPY  Right    KNEE SURGERY  02/22/2012   KNEE SURGERY Right 06/18/2014   TOTAL KNEE ARTHROPLASTY  02/22/2012   Procedure: TOTAL KNEE ARTHROPLASTY;  Surgeon: Maude LELON Right, MD;  Location: [facility] OR;  Service: Orthopedics;  Laterality: Right;   TOTAL KNEE ARTHROPLASTY Left 01/27/2021   Procedure: LEFT TOTAL KNEE ARTHROPLASTY;  Surgeon: Vernetta Lonni GRADE, MD;  Location: [facility] OR;  Service: Orthopedics;  Laterality: Left;   TOTAL KNEE REVISION Right 06/18/2014   Procedure: RIGHT TOTAL KNEE REVISION;  Surgeon: Maude LELON Right, MD;  Location: [facility] OR;  Service: Orthopedics;  Laterality: Right;   TUBAL LIGATION     Social History   Occupational History   Not on file  Tobacco Use   Smoking status: Never   Smokeless tobacco: Never  Vaping Use   Vaping status: Never Used  Substance and Sexual Activity   Alcohol use: Yes    Alcohol/week: 0.0 standard drinks of alcohol    Comment: occasional   Drug use: No   Sexual activity: Yes    Birth control/protection: Surgical    Lanett Lasorsa Estela) Arlinda, M.D. [facility], Hand Surgery

## 2024-08-15 ENCOUNTER — Other Ambulatory Visit (INDEPENDENT_AMBULATORY_CARE_PROVIDER_SITE_OTHER)

## 2024-08-15 ENCOUNTER — Ambulatory Visit (INDEPENDENT_AMBULATORY_CARE_PROVIDER_SITE_OTHER): Admitting: Orthopaedic Surgery

## 2024-08-15 ENCOUNTER — Other Ambulatory Visit: Payer: Self-pay | Admitting: Radiology

## 2024-08-15 DIAGNOSIS — G8929 Other chronic pain: Secondary | ICD-10-CM

## 2024-08-15 DIAGNOSIS — M25511 Pain in right shoulder: Secondary | ICD-10-CM | POA: Diagnosis not present

## 2024-08-15 DIAGNOSIS — M5441 Lumbago with sciatica, right side: Secondary | ICD-10-CM | POA: Diagnosis not present

## 2024-08-15 MED ORDER — METHYLPREDNISOLONE ACETATE 40 MG/ML IJ SUSP
40.0000 mg | INTRAMUSCULAR | Status: AC | PRN
  Administered 2024-08-15: 40 mg via INTRA_ARTICULAR

## 2024-08-15 MED ORDER — LIDOCAINE HCL 1 % IJ SOLN
3.0000 mL | INTRAMUSCULAR | Status: AC | PRN
  Administered 2024-08-15: 3 mL

## 2024-08-15 NOTE — Progress Notes (Signed)
 The patient is a 58 year old female that was seen today in follow-up as a relates to having a MRI of her lumbar spine.  She was having chronic bilateral low back pain with right sided radicular symptoms in the sciatic region radiating all the way down past her knee.  She is still having the same symptoms and this did necessitate a MRI of the failure conservative treatment.  However we are also seeing her today for her right shoulder.  She is recently been having some shoulder pain with overhead activities and reaching behind her for about 4 weeks now with no known injury.  Examination of the right shoulder shows pain in the subacromial outlet but no weakness.  There is pain with forward flexion and abduction.  She has full range of motion in the rotator cuff feels strong.  X-rays of the right shoulder are negative.  The shoulder is well located.  The humeral head is not high riding and the [facility] joint is well-maintained.  She still has a positive straight leg raise on the right side with radicular symptoms down her right leg with no weakness.  The MRI of her lumbar spine shows a broad-based bulge with significant severe left foraminal narrowing that is multifactorial.  This is likely irritating the left L4 nerve root but again most of her symptoms are all to the right.  I did recommend a steroid injection in her right shoulder subacromial outlet which she agreed to and tolerated well.  I will send her to Dr. Eldonna for consideration of an ESI at the L4-L5 level but mainly to the right given her symptoms.  She agrees to this treatment plan.  She did tolerate the steroid injection well in her left shoulder today.  Will work on getting a referral to Dr. Eldonna for an [facility] of the lumbar spine at L4-L5 likely to the right.    Procedure Note  Patient: Cheyenne Graham             Date of Birth: 27-Jun-1966           MRN: [id number]             Visit Date: 08/15/2024  Procedures: Visit Diagnoses:  1. Acute  pain of right shoulder   2. Chronic bilateral low back pain with right-sided sciatica     Large Joint Inj: R subacromial bursa on 08/15/2024 10:08 AM Indications: pain and diagnostic evaluation Details: 22 G 1.5 in needle  Arthrogram: No  Medications: 3 mL lidocaine  1 %; 40 mg methylPREDNISolone  acetate 40 MG/ML Outcome: tolerated well, no immediate complications Procedure, treatment alternatives, risks and benefits explained, specific risks discussed. Consent was given by the patient. Immediately prior to procedure a time out was called to verify the correct patient, procedure, equipment, support staff and site/side marked as required. Patient was prepped and draped in the usual sterile fashion.

## 2024-09-08 ENCOUNTER — Ambulatory Visit
Admission: RE | Admit: 2024-09-08 | Discharge: 2024-09-08 | Disposition: A | Source: Ambulatory Visit | Attending: Orthopedic Surgery

## 2024-09-08 DIAGNOSIS — M79641 Pain in right hand: Secondary | ICD-10-CM

## 2024-09-08 MED ORDER — GADOPICLENOL 0.5 MMOL/ML IV SOLN
8.0000 mL | Freq: Once | INTRAVENOUS | Status: AC | PRN
  Administered 2024-09-08: 8 mL via INTRAVENOUS

## 2024-09-13 ENCOUNTER — Ambulatory Visit (INDEPENDENT_AMBULATORY_CARE_PROVIDER_SITE_OTHER): Admitting: Physical Medicine and Rehabilitation

## 2024-09-13 ENCOUNTER — Other Ambulatory Visit: Payer: Self-pay

## 2024-09-13 VITALS — BP 140/91 | HR 96

## 2024-09-13 DIAGNOSIS — M5416 Radiculopathy, lumbar region: Secondary | ICD-10-CM

## 2024-09-13 MED ORDER — METHYLPREDNISOLONE ACETATE 40 MG/ML IJ SUSP
40.0000 mg | Freq: Once | INTRAMUSCULAR | Status: AC
  Administered 2024-09-13: 40 mg

## 2024-09-13 NOTE — Progress Notes (Unsigned)
 "  Cheyenne Graham - 59 y.o. female MRN [id number]  Date of birth: Jan 14, 1966  Office Visit Note: Visit Date: 09/13/2024 PCP: Austin Mutton, MD Referred by: Austin Mutton, MD  Subjective: Chief Complaint  Patient presents with   Lower Back - Pain   HPI:  Cheyenne Graham is a 59 y.o. female who comes in today at the request of Dr. Lonni Poli for planned Right L5-S1 Lumbar Interlaminar epidural steroid injection with fluoroscopic guidance.  The patient has failed conservative care including home exercise, medications, time and activity modification.  This injection will be diagnostic and hopefully therapeutic.  Please see requesting physician notes for further details and justification.   ROS Otherwise per HPI.  Assessment & Plan: Visit Diagnoses:    ICD-10-CM   1. Lumbar radiculopathy  M54.16 XR C-ARM NO REPORT    Epidural Steroid injection    methylPREDNISolone  acetate (DEPO-MEDROL ) injection 40 mg      Plan: No additional findings.   Meds & Orders:  Meds ordered this encounter  Medications   methylPREDNISolone  acetate (DEPO-MEDROL ) injection 40 mg    Orders Placed This Encounter  Procedures   XR C-ARM NO REPORT   Epidural Steroid injection    Follow-up: Return for visit to requesting provider as needed.   Procedures: No procedures performed  Lumbar Epidural Steroid Injection - Interlaminar Approach with Fluoroscopic Guidance  Patient: Cheyenne Graham      Date of Birth: September 26, 1965 MRN: [id number] PCP: Austin Mutton, MD      Visit Date: 09/13/2024   Universal Protocol:     Consent Given By: the patient  Position: PRONE  Additional Comments: Vital signs were monitored before and after the procedure. Patient was prepped and draped in the usual sterile fashion. The correct patient, procedure, and site was verified.   Injection Procedure Details:   Procedure diagnoses: Lumbar radiculopathy [M54.16]   Meds Administered:  Meds ordered this encounter  Medications    methylPREDNISolone  acetate (DEPO-MEDROL ) injection 40 mg     Laterality: Right  Location/Site:  L4-5  Needle: 3.5 in., 20 ga. Tuohy  Needle Placement: Paramedian epidural  Findings:   -Comments: Excellent flow of contrast into the epidural space.  Procedure Details: Using a paramedian approach from the side mentioned above, the region overlying the inferior lamina was localized under fluoroscopic visualization and the soft tissues overlying this structure were infiltrated with 4 ml. of 1% Lidocaine  without Epinephrine . The Tuohy needle was inserted into the epidural space using a paramedian approach.   The epidural space was localized using loss of resistance along with counter oblique bi-planar fluoroscopic views.  After negative aspirate for air, blood, and CSF, a 2 ml. volume of Isovue-250 was injected into the epidural space and the flow of contrast was observed. Radiographs were obtained for documentation purposes.    The injectate was administered into the level noted above.   Additional Comments:  The patient tolerated the procedure well Dressing: 2 x 2 sterile gauze and Band-Aid    Post-procedure details: Patient was observed during the procedure. Post-procedure instructions were reviewed.  Patient left the clinic in stable condition.   Clinical History: MR LUMBAR SPINE WITHOUT IV CONTRAST   COMPARISON: None available   CLINICAL HISTORY: Lumbar radiculopathy. Low back pain right leg pain.   TECHNIQUE: SAG T2, SAG T1, SAG STIR, AX T2, AX T1 without IV contrast.   FINDINGS: There is mild leftward curvature of the lumbar spine multilevel disc desiccation and mild to moderate facet. There  is no vertebral body height loss, subluxation or marrow replacing process. The sacrum and SI joints are unremarkable so far as visualized. Conus and cauda equina are unremarkable.   T11-12: There is mild-to-moderate facet arthrosis. There is mild left foraminal narrowing. No  high-grade foraminal or spinal stenosis present.   T12-L1: There is no focal disc protrusion, foraminal or spinal stenosis. Mild facet arthrosis.   L1-2: There is no focal disc protrusion, foraminal or spinal stenosis. Mild facet arthrosis.   L2-3: There is no focal disc protrusion, foraminal or spinal stenosis. Mild facet arthrosis.   L3-4: Mild broad-based bulge and mild-to-moderate facet arthrosis. There is moderate caudal foraminal narrowing, left greater than right. Correlation for mild left L3 radiculopathy. No significant spinal stenosis.   L4-5: Broad-based bulge with a left foraminal component. Moderate facet arthrosis is identified. There is moderate to severe left foraminal narrowing with a left foraminal protrusion abutting the exiting left L4 for nerve. Correlation for left L4 radiculopathy. There is slight effacement of ventral thecal sac without impingement of the descending nerve roots.   L5-S1: Broad-based disc osteophyte mild-to-moderate facet arthrosis. There is moderate caudal foraminal narrowing bilaterally. Correlation for mild L5 radiculopathy, left greater than right. No significant spinal stenosis is present.   The retroperitoneal structures demonstrate no significant abnormality.   IMPRESSION: Multilevel broad-based disc osteophyte and facet arthrosis as described in detail by level above. There is multilevel foraminal narrowing most notably on the left at L3-4, L4-5 and L5-S1. There is mild right foraminal narrowing at L5-S1. There is slight spinal stenosis secondary to broad-based disc osteophyte without spinal canal narrowing.   Electronically signed by: Norleen Satchel MD 07/16/2024 03:57 PM EST RP Workstation: [id number]     Objective:  VS:  HT:    WT:   BMI:     BP:(!) 140/91  HR:96bpm  TEMP: ( )  RESP:  Physical Exam Vitals and nursing note reviewed.  Constitutional:      General: She is not in acute distress.    Appearance: Normal  appearance. She is not ill-appearing.  HENT:     Head: Normocephalic and atraumatic.     Right Ear: External ear normal.     Left Ear: External ear normal.  Eyes:     Extraocular Movements: Extraocular movements intact.  Cardiovascular:     Rate and Rhythm: Normal rate.     Pulses: Normal pulses.  Pulmonary:     Effort: Pulmonary effort is normal. No respiratory distress.  Abdominal:     General: There is no distension.     Palpations: Abdomen is soft.  Musculoskeletal:        General: Tenderness present.     Cervical back: Neck supple.     Right lower leg: No edema.     Left lower leg: No edema.     Comments: Patient has good distal strength with no pain over the greater trochanters.  No clonus or focal weakness.  Skin:    Findings: No erythema, lesion or rash.  Neurological:     General: No focal deficit present.     Mental Status: She is alert and oriented to person, place, and time.     Sensory: No sensory deficit.     Motor: No weakness or abnormal muscle tone.     Coordination: Coordination normal.  Psychiatric:        Mood and Affect: Mood normal.        Behavior: Behavior normal.      Imaging:  No results found. "

## 2024-09-13 NOTE — Progress Notes (Unsigned)
 Pain Scale   Average Pain 5 Patient advising she has chronic lower back pain radiating to her right side pain increases at night and decreases when walking         +Driver, -BT, -Dye Allergies.

## 2024-09-16 NOTE — Progress Notes (Unsigned)
MRI follow up

## 2024-09-17 ENCOUNTER — Ambulatory Visit (INDEPENDENT_AMBULATORY_CARE_PROVIDER_SITE_OTHER): Admitting: Orthopedic Surgery

## 2024-09-17 DIAGNOSIS — D1612 Benign neoplasm of short bones of left upper limb: Secondary | ICD-10-CM | POA: Diagnosis not present

## 2024-09-17 DIAGNOSIS — G5602 Carpal tunnel syndrome, left upper limb: Secondary | ICD-10-CM | POA: Diagnosis not present

## 2024-09-21 NOTE — Procedures (Signed)
 Lumbar Epidural Steroid Injection - Interlaminar Approach with Fluoroscopic Guidance  Patient: Cheyenne Graham      Date of Birth: 1966/08/14 MRN: [id number] PCP: Austin Mutton, MD      Visit Date: 09/13/2024   Universal Protocol:     Consent Given By: the patient  Position: PRONE  Additional Comments: Vital signs were monitored before and after the procedure. Patient was prepped and draped in the usual sterile fashion. The correct patient, procedure, and site was verified.   Injection Procedure Details:   Procedure diagnoses: Lumbar radiculopathy [M54.16]   Meds Administered:  Meds ordered this encounter  Medications   methylPREDNISolone  acetate (DEPO-MEDROL ) injection 40 mg     Laterality: Right  Location/Site:  L4-5  Needle: 3.5 in., 20 ga. Tuohy  Needle Placement: Paramedian epidural  Findings:   -Comments: Excellent flow of contrast into the epidural space.  Procedure Details: Using a paramedian approach from the side mentioned above, the region overlying the inferior lamina was localized under fluoroscopic visualization and the soft tissues overlying this structure were infiltrated with 4 ml. of 1% Lidocaine  without Epinephrine . The Tuohy needle was inserted into the epidural space using a paramedian approach.   The epidural space was localized using loss of resistance along with counter oblique bi-planar fluoroscopic views.  After negative aspirate for air, blood, and CSF, a 2 ml. volume of Isovue-250 was injected into the epidural space and the flow of contrast was observed. Radiographs were obtained for documentation purposes.    The injectate was administered into the level noted above.   Additional Comments:  The patient tolerated the procedure well Dressing: 2 x 2 sterile gauze and Band-Aid    Post-procedure details: Patient was observed during the procedure. Post-procedure instructions were reviewed.  Patient left the clinic in stable condition.

## 2024-09-22 ENCOUNTER — Telehealth: Admitting: Family Medicine

## 2024-09-22 DIAGNOSIS — J0110 Acute frontal sinusitis, unspecified: Secondary | ICD-10-CM | POA: Diagnosis not present

## 2024-09-22 DIAGNOSIS — B001 Herpesviral vesicular dermatitis: Secondary | ICD-10-CM

## 2024-09-22 MED ORDER — FLUTICASONE PROPIONATE 50 MCG/ACT NA SUSP: 2.0000 | Freq: Every day | NASAL | 0 refills | Status: AC

## 2024-09-22 MED ORDER — LORATADINE 10 MG PO TABS: 10.0000 mg | ORAL_TABLET | Freq: Every day | ORAL | 0 refills | Status: AC

## 2024-09-22 MED ORDER — PREDNISONE 10 MG (21) PO TBPK: ORAL_TABLET | ORAL | 0 refills | Status: AC

## 2024-09-22 MED ORDER — VALACYCLOVIR HCL 1 G PO TABS: 2000.0000 mg | ORAL_TABLET | Freq: Two times a day (BID) | ORAL | 0 refills | Status: AC | PRN

## 2024-09-22 NOTE — Progress Notes (Signed)
 " Virtual Visit Consent   Cheyenne Graham, you are scheduled for a virtual visit with a [facility] provider today. Just as with appointments in the office, your consent must be obtained to participate. Your consent will be active for this visit and any virtual visit you may have with one of our providers in the next 365 days. If you have a MyChart account, a copy of this consent can be sent to you electronically.  As this is a virtual visit, video technology does not allow for your provider to perform a traditional examination. This may limit your provider's ability to fully assess your condition. If your provider identifies any concerns that need to be evaluated in person or the need to arrange testing (such as labs, EKG, etc.), we will make arrangements to do so. Although advances in technology are sophisticated, we cannot ensure that it will always work on either your end or our end. If the connection with a video visit is poor, the visit may have to be switched to a telephone visit. With either a video or telephone visit, we are not always able to ensure that we have a secure connection.  By engaging in this virtual visit, you consent to the provision of healthcare and authorize for your insurance to be billed (if applicable) for the services provided during this visit. Depending on your insurance coverage, you may receive a charge related to this service.  I need to obtain your verbal consent now. Are you willing to proceed with your visit today? [name] has provided verbal consent on 09/22/2024 for a virtual visit (video or telephone). Cheyenne Graham, [place]  Date: 09/22/2024 10:31 AM   Virtual Visit via Video Note   I, Cheyenne Graham, connected with  [name]  ([id number], 1966/03/15) on 09/22/24 at 10:15 AM EST by a video-enabled telemedicine application and verified that I am speaking with the correct person using two identifiers.  Location: Patient: Virtual Visit Location Patient:  Home Provider: Virtual Visit Location Provider: Home Office   I discussed the limitations of evaluation and management by telemedicine and the availability of in person appointments. The patient expressed understanding and agreed to proceed.    History of Present Illness: Cheyenne Graham is a 59 y.o. who identifies as a female who was assigned female at birth, and is being seen today for c/o she believes she has a sinus infection.  Pt c/o headache, sinus pain and headache and drainage x 3 days.  Pt states she has a sore throat and a slight cough. Pt states her boyfriend has pneumonia.  Pt states her mucus is clear to greenish.  Pt states she has a wet cough and she is sneezing more than coughing. Pt states she needs a refill for Valtrex  because she also feels a cold sore approaching.   HPI: HPI  Problems:  Patient Active Problem List   Diagnosis Date Noted   Status post total left knee replacement 01/27/2021   Unilateral primary osteoarthritis, left knee 11/27/2020   Primary osteoarthritis of both hands 11/17/2016   Chronic pain of right knee 10/03/2016   History of hypertension 10/03/2016   History of migraine 10/03/2016   Gastroesophageal reflux disease without esophagitis 10/03/2016   History of herpes simplex infection 10/03/2016   History of total knee replacement, right 06/18/2014   Other chronic postoperative pain 01/25/2014   Osteoarthritis of left knee 01/25/2014   Postoperative anemia due to acute blood loss 02/24/2012   Obesity (BMI  30-39.9) 02/24/2012    Allergies: Allergies[1] Medications: Current Medications[2]  Observations/Objective: Patient is well-developed, well-nourished in no acute distress.  Resting comfortably  at home.  Head is normocephalic, atraumatic.  No labored breathing.  Speech is clear and coherent with logical content.  Patient is alert and oriented at baseline.    Assessment and Plan: 1. Acute frontal sinusitis, recurrence not specified  (Primary) - fluticasone  (FLONASE ) 50 MCG/ACT nasal spray; Place 2 sprays into both nostrils daily.  Dispense: 16 g; Refill: 0 - loratadine  (CLARITIN ) 10 MG tablet; Take 1 tablet (10 mg total) by mouth daily.  Dispense: 30 tablet; Refill: 0 - predniSONE  (STERAPRED UNI-PAK 21 TAB) 10 MG (21) TBPK tablet; Take following package directions.  Dispense: 21 tablet; Refill: 0  2. Herpes labialis - valACYclovir  (VALTREX ) 1000 MG tablet; Take 2 tablets (2,000 mg total) by mouth every 12 (twelve) hours as needed for up to 1 day (cold sores).  Dispense: 4 tablet; Refill: 0  -Pt advised to follow up in person urgent care or PCP for worsening symptoms.  Follow Up Instructions: I discussed the assessment and treatment plan with the patient. The patient was provided an opportunity to ask questions and all were answered. The patient agreed with the plan and demonstrated an understanding of the instructions.  A copy of instructions were sent to the patient via MyChart unless otherwise noted below.    The patient was advised to call back or seek an in-person evaluation if the symptoms worsen or if the condition fails to improve as anticipated.    Cheyenne Mater, PA-C    [1]  Allergies Allergen Reactions   Shrimp [Shellfish Allergy] Nausea And Vomiting    makes me feel like I 'm going to die  [2]  Current Outpatient Medications:    fluticasone  (FLONASE ) 50 MCG/ACT nasal spray, Place 2 sprays into both nostrils daily., Disp: 16 g, Rfl: 0   loratadine  (CLARITIN ) 10 MG tablet, Take 1 tablet (10 mg total) by mouth daily., Disp: 30 tablet, Rfl: 0   predniSONE  (STERAPRED UNI-PAK 21 TAB) 10 MG (21) TBPK tablet, Take following package directions., Disp: 21 tablet, Rfl: 0   acetaminophen -codeine  (TYLENOL  #3) 300-30 MG tablet, Take 1-2 tablets by mouth every 8 (eight) hours as needed., Disp: 30 tablet, Rfl: 0   aspirin  81 MG chewable tablet, CHEW 1 TABLET (81 MG TOTAL) BY MOUTH 2 (TWO) TIMES DAILY., Disp: 60  tablet, Rfl: 0   HYDROcodone -acetaminophen  (NORCO/VICODIN) 5-325 MG tablet, Take 1-2 tablets by mouth every 6 (six) hours as needed for moderate pain., Disp: 30 tablet, Rfl: 0   ibuprofen  (ADVIL ,MOTRIN ) 200 MG tablet, Take 800 mg by mouth every 8 (eight) hours as needed (pain)., Disp: , Rfl:    methocarbamol  (ROBAXIN ) 500 MG tablet, Take 1 tablet (500 mg total) by mouth every 6 (six) hours as needed for muscle spasms., Disp: 40 tablet, Rfl: 1   Multiple Vitamins-Minerals (MULTIVITAMIN WITH MINERALS) tablet, Take 1 tablet by mouth in the morning., Disp: , Rfl:    oxyCODONE  (OXY IR/ROXICODONE ) 5 MG immediate release tablet, Take 1 tablet (5 mg total) by mouth every 6 (six) hours as needed for moderate pain (pain score 4-6)., Disp: 30 tablet, Rfl: 0   valACYclovir  (VALTREX ) 1000 MG tablet, Take 2 tablets (2,000 mg total) by mouth every 12 (twelve) hours as needed for up to 1 day (cold sores)., Disp: 4 tablet, Rfl: 0  "

## 2024-09-22 NOTE — Patient Instructions (Signed)
 " [name], thank you for joining [name], PA-C for today's virtual visit.  While this provider is not your primary care provider (PCP), if your PCP is located in our provider database this encounter information will be shared with them immediately following your visit.   A [facility] MyChart account gives you access to today's visit and all your visits, tests, and labs performed at [facility]  click here if you don't have a [facility] MyChart account or go to [email or web address]  Consent: (Patient) [name] provided verbal consent for this virtual visit at the beginning of the encounter.  Current Medications:  Current Outpatient Medications:    fluticasone  (FLONASE ) 50 MCG/ACT nasal spray, Place 2 sprays into both nostrils daily., Disp: 16 g, Rfl: 0   loratadine  (CLARITIN ) 10 MG tablet, Take 1 tablet (10 mg total) by mouth daily., Disp: 30 tablet, Rfl: 0   predniSONE  (STERAPRED UNI-PAK 21 TAB) 10 MG (21) TBPK tablet, Take following package directions., Disp: 21 tablet, Rfl: 0   acetaminophen -codeine  (TYLENOL  #3) 300-30 MG tablet, Take 1-2 tablets by mouth every 8 (eight) hours as needed., Disp: 30 tablet, Rfl: 0   aspirin  81 MG chewable tablet, CHEW 1 TABLET (81 MG TOTAL) BY MOUTH 2 (TWO) TIMES DAILY., Disp: 60 tablet, Rfl: 0   HYDROcodone -acetaminophen  (NORCO/VICODIN) 5-325 MG tablet, Take 1-2 tablets by mouth every 6 (six) hours as needed for moderate pain., Disp: 30 tablet, Rfl: 0   ibuprofen  (ADVIL ,MOTRIN ) 200 MG tablet, Take 800 mg by mouth every 8 (eight) hours as needed (pain)., Disp: , Rfl:    methocarbamol  (ROBAXIN ) 500 MG tablet, Take 1 tablet (500 mg total) by mouth every 6 (six) hours as needed for muscle spasms., Disp: 40 tablet, Rfl: 1   Multiple Vitamins-Minerals (MULTIVITAMIN WITH MINERALS) tablet, Take 1 tablet by mouth in the morning., Disp: , Rfl:    oxyCODONE  (OXY IR/ROXICODONE ) 5 MG immediate release tablet, Take 1 tablet (5 mg total) by  mouth every 6 (six) hours as needed for moderate pain (pain score 4-6)., Disp: 30 tablet, Rfl: 0   valACYclovir  (VALTREX ) 1000 MG tablet, Take 2 tablets (2,000 mg total) by mouth every 12 (twelve) hours as needed for up to 1 day (cold sores)., Disp: 4 tablet, Rfl: 0   Medications ordered in this encounter:  Meds ordered this encounter  Medications   fluticasone  (FLONASE ) 50 MCG/ACT nasal spray    Sig: Place 2 sprays into both nostrils daily.    Dispense:  16 g    Refill:  0   loratadine  (CLARITIN ) 10 MG tablet    Sig: Take 1 tablet (10 mg total) by mouth daily.    Dispense:  30 tablet    Refill:  0   predniSONE  (STERAPRED UNI-PAK 21 TAB) 10 MG (21) TBPK tablet    Sig: Take following package directions.    Dispense:  21 tablet    Refill:  0    Please dispense one standard blister pack taper.   valACYclovir  (VALTREX ) 1000 MG tablet    Sig: Take 2 tablets (2,000 mg total) by mouth every 12 (twelve) hours as needed for up to 1 day (cold sores).    Dispense:  4 tablet    Refill:  0     *If you need refills on other medications prior to your next appointment, please contact your pharmacy*  Follow-Up: Call back or seek an in-person evaluation if the symptoms worsen or if the condition fails to improve as anticipated.  [facility] [phone]  Other Instructions Sinus Infection, Adult A sinus infection, also called sinusitis, is inflammation of your sinuses. Sinuses are hollow spaces in the bones around your face. Your sinuses are located: Around your eyes. In the middle of your forehead. Behind your nose. In your cheekbones. Mucus normally drains out of your sinuses. When your nasal tissues become inflamed or swollen, mucus can become trapped or blocked. This allows bacteria, viruses, and fungi to grow, which leads to infection. Most infections of the sinuses are caused by a virus. A sinus infection can develop quickly. It can last for up to 4 weeks (acute) or for  more than 12 weeks (chronic). A sinus infection often develops after a cold. What are the causes? This condition is caused by anything that creates swelling in the sinuses or stops mucus from draining. This includes: Allergies. Asthma. Infection from bacteria or viruses. Deformities or blockages in your nose or sinuses. Abnormal growths in the nose (nasal polyps). Pollutants, such as chemicals or irritants in the air. Infection from fungi. This is rare. What increases the risk? You are more likely to develop this condition if you: Have a weak body defense system (immune system). Do a lot of swimming or diving. Overuse nasal sprays. Smoke. What are the signs or symptoms? The main symptoms of this condition are pain and a feeling of pressure around the affected sinuses. Other symptoms include: Stuffy nose or congestion that makes it difficult to breathe through your nose. Thick yellow or greenish drainage from your nose. Tenderness, swelling, and warmth over the affected sinuses. A cough that may get worse at night. Decreased sense of smell and taste. Extra mucus that collects in the throat or the back of the nose (postnasal drip) causing a sore throat or bad breath. Tiredness (fatigue). Fever. How is this diagnosed? This condition is diagnosed based on: Your symptoms. Your medical history. A physical exam. Tests to find out if your condition is acute or chronic. This may include: Checking your nose for nasal polyps. Viewing your sinuses using a device that has a light (endoscope). Testing for allergies or bacteria. Imaging tests, such as an MRI or CT scan. In rare cases, a bone biopsy may be done to rule out more serious types of fungal sinus disease. How is this treated? Treatment for a sinus infection depends on the cause and whether your condition is chronic or acute. If caused by a virus, your symptoms should go away on their own within 10 days. You may be given medicines to  relieve symptoms. They include: Medicines that shrink swollen nasal passages (decongestants). A spray that eases inflammation of the nostrils (topical intranasal corticosteroids). Rinses that help get rid of thick mucus in your nose (nasal saline washes). Medicines that treat allergies (antihistamines). Over-the-counter pain relievers. If caused by bacteria, your health care provider may recommend waiting to see if your symptoms improve. Most bacterial infections will get better without antibiotic medicine. You may be given antibiotics if you have: A severe infection. A weak immune system. If caused by narrow nasal passages or nasal polyps, surgery may be needed. Follow these instructions at home: Medicines Take, use, or apply over-the-counter and prescription medicines only as told by your health care provider. These may include nasal sprays. If you were prescribed an antibiotic medicine, take it as told by your health care provider. Do not stop taking the antibiotic even if you start to feel better. Hydrate and humidify  Drink enough fluid  to keep your urine pale yellow. Staying hydrated will help to thin your mucus. Use a cool mist humidifier to keep the humidity level in your home above 50%. Inhale steam for 10-15 minutes, 3-4 times a day, or as told by your health care provider. You can do this in the bathroom while a hot shower is running. Limit your exposure to cool or dry air. Rest Rest as much as possible. Sleep with your head raised (elevated). Make sure you get enough sleep each night. General instructions  Apply a warm, moist washcloth to your face 3-4 times a day or as told by your health care provider. This will help with discomfort. Use nasal saline washes as often as told by your health care provider. Wash your hands often with soap and water  to reduce your exposure to germs. If soap and water  are not available, use hand sanitizer. Do not smoke. Avoid being around people  who are smoking (secondhand smoke). Keep all follow-up visits. This is important. Contact a health care provider if: You have a fever. Your symptoms get worse. Your symptoms do not improve within 10 days. Get help right away if: You have a severe headache. You have persistent vomiting. You have severe pain or swelling around your face or eyes. You have vision problems. You develop confusion. Your neck is stiff. You have trouble breathing. These symptoms may be an emergency. Get help right away. Call 911. Do not wait to see if the symptoms will go away. Do not drive yourself to the hospital. Summary A sinus infection is soreness and inflammation of your sinuses. Sinuses are hollow spaces in the bones around your face. This condition is caused by nasal tissues that become inflamed or swollen. The swelling traps or blocks the flow of mucus. This allows bacteria, viruses, and fungi to grow, which leads to infection. If you were prescribed an antibiotic medicine, take it as told by your health care provider. Do not stop taking the antibiotic even if you start to feel better. Keep all follow-up visits. This is important. This information is not intended to replace advice given to you by your health care provider. Make sure you discuss any questions you have with your health care provider. Document Revised: 07/28/2021 Document Reviewed: 07/28/2021 Elsevier Patient Education  2024 [employer]   If you have been instructed to have an in-person evaluation today at a local Urgent Care facility, please use the link below. It will take you to a list of all of our available [facility], including address, phone number and hours of operation. Please do not delay care.  [facility]  If you or a family member do not have a primary care provider, use the link below to schedule a visit and establish care. When you choose a [facility] primary care physician or advanced  practice provider, you gain a long-term partner in health. Find a Primary Care Provider  Learn more about [facility]'s in-office and virtual care options: [facility] - Get Care Now  "

## 2024-11-20 ENCOUNTER — Ambulatory Visit: Admitting: Rheumatology

## 2024-12-19 ENCOUNTER — Ambulatory Visit: Admitting: Rheumatology
